# Patient Record
Sex: Male | Born: 1950 | Race: White | Hispanic: No | Marital: Married | Smoking: Former smoker
Health system: Southern US, Community
[De-identification: ages and names within clinical notes are randomized; demographics above are authoritative.]

## PROBLEM LIST (undated history)

## (undated) DIAGNOSIS — N1330 Unspecified hydronephrosis: Secondary | ICD-10-CM

## (undated) DIAGNOSIS — N433 Hydrocele, unspecified: Secondary | ICD-10-CM

## (undated) DIAGNOSIS — K5909 Other constipation: Secondary | ICD-10-CM

## (undated) DIAGNOSIS — C20 Malignant neoplasm of rectum: Principal | ICD-10-CM

## (undated) DIAGNOSIS — D649 Anemia, unspecified: Secondary | ICD-10-CM

## (undated) DIAGNOSIS — J302 Other seasonal allergic rhinitis: Secondary | ICD-10-CM

## (undated) DIAGNOSIS — R600 Localized edema: Secondary | ICD-10-CM

## (undated) DIAGNOSIS — Z973 Presence of spectacles and contact lenses: Secondary | ICD-10-CM

## (undated) HISTORY — DX: Malignant neoplasm of rectum: C20

## (undated) HISTORY — DX: Anemia, unspecified: D64.9

## (undated) HISTORY — PX: COLONOSCOPY WITH ESOPHAGOGASTRODUODENOSCOPY (EGD): SHX5779

---

## 1999-09-08 ENCOUNTER — Encounter: Admission: RE | Admit: 1999-09-08 | Discharge: 1999-09-08 | Payer: Self-pay

## 2001-08-06 ENCOUNTER — Ambulatory Visit (HOSPITAL_COMMUNITY): Admission: RE | Admit: 2001-08-06 | Discharge: 2001-08-06 | Payer: Self-pay | Admitting: Gastroenterology

## 2001-09-03 ENCOUNTER — Ambulatory Visit (HOSPITAL_COMMUNITY): Admission: RE | Admit: 2001-09-03 | Discharge: 2001-09-03 | Payer: Self-pay | Admitting: Gastroenterology

## 2002-06-02 ENCOUNTER — Encounter: Admission: RE | Admit: 2002-06-02 | Discharge: 2002-06-02 | Payer: Self-pay

## 2002-06-19 ENCOUNTER — Encounter: Admission: RE | Admit: 2002-06-19 | Discharge: 2002-06-19 | Payer: Self-pay

## 2012-03-26 LAB — HM COLONOSCOPY

## 2012-05-23 ENCOUNTER — Ambulatory Visit (INDEPENDENT_AMBULATORY_CARE_PROVIDER_SITE_OTHER): Payer: Federal, State, Local not specified - PPO | Admitting: Internal Medicine

## 2012-05-23 ENCOUNTER — Encounter: Payer: Self-pay | Admitting: Internal Medicine

## 2012-05-23 VITALS — BP 112/60 | HR 73 | Temp 98.0°F | Resp 16 | Ht 75.0 in | Wt 196.0 lb

## 2012-05-23 DIAGNOSIS — Z0000 Encounter for general adult medical examination without abnormal findings: Secondary | ICD-10-CM | POA: Insufficient documentation

## 2012-05-23 NOTE — Patient Instructions (Signed)
Health Maintenance, Males A healthy lifestyle and preventative care can promote health and wellness.  Maintain regular health, dental, and eye exams.  Eat a healthy diet. Foods like vegetables, fruits, whole grains, low-fat dairy products, and lean protein foods contain the nutrients you need without too many calories. Decrease your intake of foods high in solid fats, added sugars, and salt. Get information about a proper diet from your caregiver, if necessary.  Regular physical exercise is one of the most important things you can do for your health. Most adults should get at least 150 minutes of moderate-intensity exercise (any activity that increases your heart rate and causes you to sweat) each week. In addition, most adults need muscle-strengthening exercises on 2 or more days a week.   Maintain a healthy weight. The body mass index (BMI) is a screening tool to identify possible weight problems. It provides an estimate of body fat based on height and weight. Your caregiver can help determine your BMI, and can help you achieve or maintain a healthy weight. For adults 20 years and older:  A BMI below 18.5 is considered underweight.  A BMI of 18.5 to 24.9 is normal.  A BMI of 25 to 29.9 is considered overweight.  A BMI of 30 and above is considered obese.  Maintain normal blood lipids and cholesterol by exercising and minimizing your intake of saturated fat. Eat a balanced diet with plenty of fruits and vegetables. Blood tests for lipids and cholesterol should begin at age 20 and be repeated every 5 years. If your lipid or cholesterol levels are high, you are over 50, or you are a high risk for heart disease, you may need your cholesterol levels checked more frequently.Ongoing high lipid and cholesterol levels should be treated with medicines, if diet and exercise are not effective.  If you smoke, find out from your caregiver how to quit. If you do not use tobacco, do not start.  If you  choose to drink alcohol, do not exceed 2 drinks per day. One drink is considered to be 12 ounces (355 mL) of beer, 5 ounces (148 mL) of wine, or 1.5 ounces (44 mL) of liquor.  Avoid use of street drugs. Do not share needles with anyone. Ask for help if you need support or instructions about stopping the use of drugs.  High blood pressure causes heart disease and increases the risk of stroke. Blood pressure should be checked at least every 1 to 2 years. Ongoing high blood pressure should be treated with medicines if weight loss and exercise are not effective.  If you are 45 to 62 years old, ask your caregiver if you should take aspirin to prevent heart disease.  Diabetes screening involves taking a blood sample to check your fasting blood sugar level. This should be done once every 3 years, after age 45, if you are within normal weight and without risk factors for diabetes. Testing should be considered at a younger age or be carried out more frequently if you are overweight and have at least 1 risk factor for diabetes.  Colorectal cancer can be detected and often prevented. Most routine colorectal cancer screening begins at the age of 50 and continues through age 75. However, your caregiver may recommend screening at an earlier age if you have risk factors for colon cancer. On a yearly basis, your caregiver may provide home test kits to check for hidden blood in the stool. Use of a small camera at the end of a tube,   to directly examine the colon (sigmoidoscopy or colonoscopy), can detect the earliest forms of colorectal cancer. Talk to your caregiver about this at age 50, when routine screening begins. Direct examination of the colon should be repeated every 5 to 10 years through age 75, unless early forms of pre-cancerous polyps or small growths are found.  Hepatitis C blood testing is recommended for all people born from 1945 through 1965 and any individual with known risks for hepatitis C.  Healthy  men should no longer receive prostate-specific antigen (PSA) blood tests as part of routine cancer screening. Consult with your caregiver about prostate cancer screening.  Testicular cancer screening is not recommended for adolescents or adult males who have no symptoms. Screening includes self-exam, caregiver exam, and other screening tests. Consult with your caregiver about any symptoms you have or any concerns you have about testicular cancer.  Practice safe sex. Use condoms and avoid high-risk sexual practices to reduce the spread of sexually transmitted infections (STIs).  Use sunscreen with a sun protection factor (SPF) of 30 or greater. Apply sunscreen liberally and repeatedly throughout the day. You should seek shade when your shadow is shorter than you. Protect yourself by wearing long sleeves, pants, a wide-brimmed hat, and sunglasses year round, whenever you are outdoors.  Notify your caregiver of new moles or changes in moles, especially if there is a change in shape or color. Also notify your caregiver if a mole is larger than the size of a pencil eraser.  A one-time screening for abdominal aortic aneurysm (AAA) and surgical repair of large AAAs by sound wave imaging (ultrasonography) is recommended for ages 65 to 75 years who are current or former smokers.  Stay current with your immunizations. Document Released: 11/25/2007 Document Revised: 08/21/2011 Document Reviewed: 10/24/2010 [employer] Patient Information 2013 [employer].  

## 2012-05-23 NOTE — Progress Notes (Signed)
  Subjective:    Patient ID: Nicholas Suarez, male    DOB: 12-08-1950, 61 y.o.   MRN: [id number]  HPI  New to me he needs a new PCP - he had a complete physical at [facility] about 2 months ago, no records are available today. He feels well and offers no complaints.  Review of Systems  Constitutional: Negative.   HENT: Negative.   Eyes: Negative.   Respiratory: Negative.   Cardiovascular: Negative.   Gastrointestinal: Negative.   Genitourinary: Negative.   Musculoskeletal: Negative.   Skin: Negative.   Neurological: Negative.   Hematological: Negative.   Psychiatric/Behavioral: Negative.        Objective:   Physical Exam  Vitals reviewed. Constitutional: He is oriented to person, place, and time. He appears well-developed and well-nourished. No distress.  HENT:  Head: Normocephalic and atraumatic.  Mouth/Throat: Oropharynx is clear and moist. No oropharyngeal exudate.  Eyes: Conjunctivae normal are normal. Right eye exhibits no discharge. Left eye exhibits no discharge. No scleral icterus.  Neck: Normal range of motion. Neck supple. No JVD present. No tracheal deviation present. No thyromegaly present.  Cardiovascular: Normal rate, regular rhythm, normal heart sounds and intact distal pulses.  Exam reveals no gallop and no friction rub.   No murmur heard. Pulmonary/Chest: Effort normal and breath sounds normal. No stridor. No respiratory distress. He has no wheezes. He has no rales. He exhibits no tenderness.  Abdominal: Soft. Bowel sounds are normal. He exhibits no distension and no mass. There is no tenderness. There is no rebound and no guarding.  Musculoskeletal: Normal range of motion. He exhibits no edema and no tenderness.  Lymphadenopathy:    He has no cervical adenopathy.  Neurological: He is oriented to person, place, and time.  Skin: Skin is warm and dry. No rash noted. He is not diaphoretic. No erythema. No pallor.  Psychiatric: He has a normal mood and affect. His  behavior is normal. Judgment and thought content normal.     No results found for this basename: WBC, HGB, HCT, PLT, GLUCOSE, CHOL, TRIG, HDL, LDLDIRECT, LDLCALC, ALT, AST, NA, K, CL, CREATININE, BUN, CO2, TSH, PSA, INR, GLUF, HGBA1C, MICROALBUR       Assessment & Plan:

## 2012-05-23 NOTE — Assessment & Plan Note (Signed)
I requested the records from [facility] today

## 2013-05-30 ENCOUNTER — Encounter: Payer: Self-pay | Admitting: Internal Medicine

## 2013-05-30 ENCOUNTER — Ambulatory Visit (INDEPENDENT_AMBULATORY_CARE_PROVIDER_SITE_OTHER): Payer: Federal, State, Local not specified - PPO | Admitting: Internal Medicine

## 2013-05-30 VITALS — BP 122/80 | HR 80 | Temp 97.1°F | Resp 16 | Wt 208.0 lb

## 2013-05-30 DIAGNOSIS — J069 Acute upper respiratory infection, unspecified: Secondary | ICD-10-CM

## 2013-05-30 MED ORDER — AZITHROMYCIN 250 MG PO TABS: ORAL_TABLET | ORAL | Status: DC

## 2013-05-30 NOTE — Assessment & Plan Note (Signed)
Zpac if worse 

## 2013-05-30 NOTE — Progress Notes (Signed)
Pre visit review using our clinic review tool, if applicable. No additional management support is needed unless otherwise documented below in the visit note. 

## 2013-05-30 NOTE — Progress Notes (Signed)
   Subjective:    Patient ID: Nicholas Suarez, male    DOB: 12/31/1950, 62 y.o.   MRN: [id number]  Sore Throat  This is a new problem. The current episode started in the past 7 days. The problem has been gradually worsening. Neither side of throat is experiencing more pain than the other. The maximum temperature recorded prior to his arrival was 100 - 100.9 F. The pain is moderate. Associated symptoms include coughing. Pertinent negatives include no congestion, diarrhea, neck pain or trouble swallowing. He has had no exposure to strep.   Sick at home since 12.17   Review of Systems  Constitutional: Negative for appetite change, fatigue and unexpected weight change.  HENT: Positive for sore throat. Negative for congestion, nosebleeds, sneezing and trouble swallowing.   Eyes: Negative for itching and visual disturbance.  Respiratory: Positive for cough.   Cardiovascular: Negative for chest pain, palpitations and leg swelling.  Gastrointestinal: Negative for nausea, diarrhea, blood in stool and abdominal distention.  Genitourinary: Negative for frequency and hematuria.  Musculoskeletal: Negative for back pain, gait problem, joint swelling and neck pain.  Skin: Negative for rash.  Neurological: Negative for dizziness, tremors, speech difficulty and weakness.  Psychiatric/Behavioral: Negative for sleep disturbance, dysphoric mood and agitation. The patient is not nervous/anxious.        Objective:   Physical Exam  Constitutional: He is oriented to person, place, and time. He appears well-developed. No distress.  NAD  HENT:  Mouth/Throat: Oropharynx is clear and moist.  eryth throat  Eyes: Conjunctivae are normal. Pupils are equal, round, and reactive to light.  Neck: Normal range of motion. No JVD present. No thyromegaly present.  Cardiovascular: Normal rate, regular rhythm, normal heart sounds and intact distal pulses.  Exam reveals no gallop and no friction rub.   No murmur  heard. Pulmonary/Chest: Effort normal and breath sounds normal. No respiratory distress. He has no wheezes. He has no rales. He exhibits no tenderness.  Abdominal: Soft. Bowel sounds are normal. He exhibits no distension and no mass. There is no tenderness. There is no rebound and no guarding.  Musculoskeletal: Normal range of motion. He exhibits no edema and no tenderness.  Lymphadenopathy:    He has no cervical adenopathy.  Neurological: He is alert and oriented to person, place, and time. He has normal reflexes. No cranial nerve deficit. He exhibits normal muscle tone. He displays a negative Romberg sign. Coordination and gait normal.  No meningeal signs  Skin: Skin is warm and dry. No rash noted.  Psychiatric: He has a normal mood and affect. His behavior is normal. Judgment and thought content normal.          Assessment & Plan:

## 2013-05-30 NOTE — Patient Instructions (Signed)

## 2014-04-09 ENCOUNTER — Encounter: Payer: Self-pay | Admitting: Internal Medicine

## 2014-04-09 ENCOUNTER — Other Ambulatory Visit (INDEPENDENT_AMBULATORY_CARE_PROVIDER_SITE_OTHER): Payer: Federal, State, Local not specified - PPO

## 2014-04-09 ENCOUNTER — Ambulatory Visit (INDEPENDENT_AMBULATORY_CARE_PROVIDER_SITE_OTHER): Payer: Federal, State, Local not specified - PPO | Admitting: Internal Medicine

## 2014-04-09 VITALS — BP 130/70 | HR 76 | Temp 97.5°F | Resp 16 | Ht 73.0 in | Wt 200.0 lb

## 2014-04-09 DIAGNOSIS — Z0000 Encounter for general adult medical examination without abnormal findings: Secondary | ICD-10-CM

## 2014-04-09 LAB — URINALYSIS, ROUTINE W REFLEX MICROSCOPIC
BILIRUBIN URINE: NEGATIVE
Hgb urine dipstick: NEGATIVE
Ketones, ur: NEGATIVE
Leukocytes, UA: NEGATIVE
NITRITE: NEGATIVE
Specific Gravity, Urine: 1.02 (ref 1.000–1.030)
TOTAL PROTEIN, URINE-UPE24: NEGATIVE
Urine Glucose: NEGATIVE
Urobilinogen, UA: 0.2 (ref 0.0–1.0)
pH: 6 (ref 5.0–8.0)

## 2014-04-09 LAB — CBC WITH DIFFERENTIAL/PLATELET
BASOS PCT: 0.6 % (ref 0.0–3.0)
Basophils Absolute: 0 10*3/uL (ref 0.0–0.1)
EOS ABS: 0.3 10*3/uL (ref 0.0–0.7)
Eosinophils Relative: 4.8 % (ref 0.0–5.0)
HCT: 42.8 % (ref 39.0–52.0)
HEMOGLOBIN: 13.9 g/dL (ref 13.0–17.0)
Lymphocytes Relative: 27.3 % (ref 12.0–46.0)
Lymphs Abs: 1.6 10*3/uL (ref 0.7–4.0)
MCHC: 32.6 g/dL (ref 30.0–36.0)
MCV: 94 fl (ref 78.0–100.0)
MONO ABS: 0.4 10*3/uL (ref 0.1–1.0)
Monocytes Relative: 6.8 % (ref 3.0–12.0)
NEUTROS ABS: 3.5 10*3/uL (ref 1.4–7.7)
Neutrophils Relative %: 60.5 % (ref 43.0–77.0)
Platelets: 198 10*3/uL (ref 150.0–400.0)
RBC: 4.55 Mil/uL (ref 4.22–5.81)
RDW: 13.9 % (ref 11.5–15.5)
WBC: 5.8 10*3/uL (ref 4.0–10.5)

## 2014-04-09 LAB — COMPREHENSIVE METABOLIC PANEL
ALK PHOS: 58 U/L (ref 39–117)
ALT: 26 U/L (ref 0–53)
AST: 25 U/L (ref 0–37)
Albumin: 3.4 g/dL — ABNORMAL LOW (ref 3.5–5.2)
BUN: 13 mg/dL (ref 6–23)
CO2: 25 mEq/L (ref 19–32)
CREATININE: 1 mg/dL (ref 0.4–1.5)
Calcium: 10 mg/dL (ref 8.4–10.5)
Chloride: 106 mEq/L (ref 96–112)
GFR: 81.87 mL/min (ref >60.00)
Glucose, Bld: 100 mg/dL — ABNORMAL HIGH (ref 70–99)
Potassium: 4.3 mEq/L (ref 3.5–5.1)
Sodium: 136 mEq/L (ref 135–145)
Total Bilirubin: 0.9 mg/dL (ref 0.2–1.2)
Total Protein: 6.2 g/dL (ref 6.0–8.3)

## 2014-04-09 LAB — TSH: TSH: 1.65 u[IU]/mL (ref 0.35–4.50)

## 2014-04-09 LAB — PSA: PSA: 0.35 ng/mL (ref 0.10–4.00)

## 2014-04-09 LAB — LIPID PANEL
CHOL/HDL RATIO: 4
Cholesterol: 164 mg/dL (ref 0–200)
HDL: 39.3 mg/dL (ref >39.00)
LDL CALC: 110 mg/dL — AB (ref 0–99)
NonHDL: 124.7
Triglycerides: 74 mg/dL (ref 0.0–149.0)
VLDL: 14.8 mg/dL (ref 0.0–40.0)

## 2014-04-09 LAB — FECAL OCCULT BLOOD, GUAIAC: Fecal Occult Blood: NEGATIVE

## 2014-04-09 NOTE — Patient Instructions (Signed)

## 2014-04-09 NOTE — Progress Notes (Signed)
   Subjective:    Patient ID: Nicholas Suarez, male    DOB: 03-21-1951, 63 y.o.   MRN: [id number]  HPI Comments: He returns for a physical, he tells me that he feels well and offers no complaints.     Review of Systems  Constitutional: Negative.  Negative for fever, chills, diaphoresis, appetite change and fatigue.  HENT: Negative.   Eyes: Negative.   Respiratory: Negative.  Negative for cough, choking, chest tightness, shortness of breath and stridor.   Cardiovascular: Negative.  Negative for chest pain, palpitations and leg swelling.  Gastrointestinal: Negative.  Negative for nausea, vomiting, abdominal pain, diarrhea, constipation and blood in stool.  Endocrine: Negative.   Genitourinary: Negative.  Negative for urgency, flank pain, decreased urine volume and difficulty urinating.  Musculoskeletal: Negative.  Negative for myalgias and back pain.  Skin: Negative.  Negative for rash.  Allergic/Immunologic: Negative.   Neurological: Negative.  Negative for dizziness.  Hematological: Negative.  Negative for adenopathy. Does not bruise/bleed easily.  Psychiatric/Behavioral: Negative.        Objective:   Physical Exam  Constitutional: He is oriented to person, place, and time. He appears well-developed and well-nourished. No distress.  HENT:  Head: Normocephalic and atraumatic.  Mouth/Throat: Oropharynx is clear and moist. No oropharyngeal exudate.  Eyes: Conjunctivae are normal. Right eye exhibits no discharge. Left eye exhibits no discharge. No scleral icterus.  Neck: Normal range of motion. Neck supple. No JVD present. No tracheal deviation present. No thyromegaly present.  Cardiovascular: Normal rate, regular rhythm, normal heart sounds and intact distal pulses.  Exam reveals no gallop and no friction rub.   No murmur heard. Pulmonary/Chest: Effort normal and breath sounds normal. No stridor. No respiratory distress. He has no wheezes. He has no rales. He exhibits no tenderness.    Abdominal: Soft. Bowel sounds are normal. He exhibits no distension and no mass. There is no tenderness. There is no rebound and no guarding. Hernia confirmed negative in the right inguinal area and confirmed negative in the left inguinal area.  Genitourinary: Rectum normal, prostate normal, testes normal and penis normal. Rectal exam shows no external hemorrhoid, no internal hemorrhoid, no fissure, no mass, no tenderness and anal tone normal. Guaiac negative stool. Prostate is not enlarged and not tender. Right testis shows no mass, no swelling and no tenderness. Right testis is descended. Left testis shows no mass, no swelling and no tenderness. Left testis is descended. Uncircumcised. No phimosis, paraphimosis, hypospadias, penile erythema or penile tenderness. No discharge found.  Musculoskeletal: Normal range of motion. He exhibits no edema or tenderness.  Lymphadenopathy:    He has no cervical adenopathy.       Right: No inguinal adenopathy present.       Left: No inguinal adenopathy present.  Neurological: He is oriented to person, place, and time.  Skin: Skin is warm and dry. No rash noted. He is not diaphoretic. No erythema. No pallor.  Psychiatric: He has a normal mood and affect. His behavior is normal. Judgment and thought content normal.  Vitals reviewed.     No results found for this basename: WBC, HGB, HCT, PLT, GLUCOSE, CHOL, TRIG, HDL, LDLDIRECT, LDLCALC, ALT, AST, NA, K, CL, CREATININE, BUN, CO2, TSH, PSA, INR, GLUF, HGBA1C, MICROALBUR      Assessment & Plan:

## 2014-04-09 NOTE — Progress Notes (Signed)
Pre visit review using our clinic review tool, if applicable. No additional management support is needed unless otherwise documented below in the visit note. 

## 2014-04-12 NOTE — Assessment & Plan Note (Signed)
Exam done He refused a flu vax Labs ordered Pt ed material was given 

## 2014-04-28 ENCOUNTER — Ambulatory Visit (INDEPENDENT_AMBULATORY_CARE_PROVIDER_SITE_OTHER): Payer: Federal, State, Local not specified - PPO | Admitting: Internal Medicine

## 2014-04-28 ENCOUNTER — Other Ambulatory Visit: Payer: Federal, State, Local not specified - PPO

## 2014-04-28 ENCOUNTER — Ambulatory Visit (INDEPENDENT_AMBULATORY_CARE_PROVIDER_SITE_OTHER)
Admission: RE | Admit: 2014-04-28 | Discharge: 2014-04-28 | Disposition: A | Discharge status: Home / Self Care | Payer: Federal, State, Local not specified - PPO | Source: Ambulatory Visit | Attending: Internal Medicine | Admitting: Internal Medicine

## 2014-04-28 ENCOUNTER — Encounter: Payer: Self-pay | Admitting: Internal Medicine

## 2014-04-28 VITALS — BP 120/80 | HR 89 | Temp 98.0°F | Resp 16 | Ht 73.0 in | Wt 207.0 lb

## 2014-04-28 DIAGNOSIS — J202 Acute bronchitis due to streptococcus: Secondary | ICD-10-CM

## 2014-04-28 DIAGNOSIS — R059 Cough, unspecified: Secondary | ICD-10-CM

## 2014-04-28 DIAGNOSIS — R05 Cough: Secondary | ICD-10-CM

## 2014-04-28 DIAGNOSIS — Z0000 Encounter for general adult medical examination without abnormal findings: Secondary | ICD-10-CM

## 2014-04-28 LAB — HEPATITIS C ANTIBODY: HCV Ab: NEGATIVE

## 2014-04-28 MED ORDER — PROMETHAZINE-DM 6.25-15 MG/5ML PO SYRP: 5.0000 mL | ORAL_SOLUTION | Freq: Four times a day (QID) | ORAL | Status: DC | PRN

## 2014-04-28 MED ORDER — AZITHROMYCIN 500 MG PO TABS: 500.0000 mg | ORAL_TABLET | Freq: Every day | ORAL | Status: DC

## 2014-04-28 NOTE — Patient Instructions (Signed)

## 2014-04-28 NOTE — Progress Notes (Signed)
   Subjective:    Patient ID: Nicholas Suarez, male    DOB: 07/11/1950, 63 y.o.   MRN: [id number]  Cough This is a new problem. The current episode started in the past 7 days. The problem has been gradually worsening. The problem occurs every few hours. The cough is productive of brown sputum. Associated symptoms include chills. Pertinent negatives include no chest pain, ear congestion, ear pain, fever, headaches, heartburn, hemoptysis, myalgias, nasal congestion, postnasal drip, rash, rhinorrhea, sore throat, shortness of breath, sweats, weight loss or wheezing. He has tried OTC cough suppressant for the symptoms. The treatment provided mild relief. There is no history of asthma, bronchiectasis, bronchitis, COPD, emphysema, environmental allergies or pneumonia.      Review of Systems  Constitutional: Positive for chills. Negative for fever and weight loss.  HENT: Negative for ear pain, postnasal drip, rhinorrhea and sore throat.   Respiratory: Positive for cough. Negative for hemoptysis, shortness of breath and wheezing.   Cardiovascular: Negative for chest pain.  Gastrointestinal: Negative for heartburn.  Musculoskeletal: Negative for myalgias.  Skin: Negative for rash.  Allergic/Immunologic: Negative for environmental allergies.  Neurological: Negative for headaches.  All other systems reviewed and are negative.      Objective:   Physical Exam  Constitutional: He is oriented to person, place, and time. He appears well-developed and well-nourished.  Non-toxic appearance. He does not have a sickly appearance. He does not appear ill. No distress.  HENT:  Head: Normocephalic and atraumatic.  Mouth/Throat: Oropharynx is clear and moist. No oropharyngeal exudate.  Eyes: Conjunctivae are normal. Right eye exhibits no discharge. Left eye exhibits no discharge. No scleral icterus.  Neck: Normal range of motion. Neck supple. No JVD present. No tracheal deviation present. No thyromegaly  present.  Cardiovascular: Normal rate, regular rhythm, normal heart sounds and intact distal pulses.  Exam reveals no gallop and no friction rub.   No murmur heard. Pulmonary/Chest: Effort normal. No accessory muscle usage or stridor. No respiratory distress. He has no decreased breath sounds. He has no wheezes. He has rhonchi in the right lower field and the left lower field. He has no rales. He exhibits no tenderness.  Abdominal: Soft. Bowel sounds are normal. He exhibits no distension and no mass. There is no tenderness. There is no rebound and no guarding.  Musculoskeletal: Normal range of motion. He exhibits no edema or tenderness.  Lymphadenopathy:    He has no cervical adenopathy.  Neurological: He is oriented to person, place, and time.  Skin: Skin is warm and dry. No rash noted. He is not diaphoretic. No erythema. No pallor.  Psychiatric: He has a normal mood and affect. His behavior is normal. Judgment and thought content normal.  Vitals reviewed.    Lab Results  Component Value Date   WBC 5.8 04/09/2014   HGB 13.9 04/09/2014   HCT 42.8 04/09/2014   PLT 198.0 04/09/2014   GLUCOSE 100* 04/09/2014   CHOL 164 04/09/2014   TRIG 74.0 04/09/2014   HDL 39.30 04/09/2014   LDLCALC 110* 04/09/2014   ALT 26 04/09/2014   AST 25 04/09/2014   NA 136 04/09/2014   K 4.3 04/09/2014   CL 106 04/09/2014   CREATININE 1.0 04/09/2014   BUN 13 04/09/2014   CO2 25 04/09/2014   TSH 1.65 04/09/2014   PSA 0.35 04/09/2014       Assessment & Plan:

## 2014-04-28 NOTE — Progress Notes (Signed)
Pre visit review using our clinic review tool, if applicable. No additional management support is needed unless otherwise documented below in the visit note. 

## 2014-04-29 ENCOUNTER — Encounter: Payer: Self-pay | Admitting: Internal Medicine

## 2014-04-29 NOTE — Assessment & Plan Note (Signed)
His CXR is normal Will treat the infection with zithromax and will control the cough with phenergan-dm

## 2014-04-29 NOTE — Assessment & Plan Note (Signed)
His CXR is normal 

## 2014-04-29 NOTE — Addendum Note (Signed)
Addended by: Janith Lima on: 04/29/2014 07:40 AM   Modules accepted: Miquel Dunn

## 2014-05-14 ENCOUNTER — Telehealth: Payer: Self-pay | Admitting: Internal Medicine

## 2014-05-14 NOTE — Telephone Encounter (Signed)
Received 3 pages from [place], sent to Dr. Ronnald Ramp. 05/14/14/ss

## 2014-10-13 ENCOUNTER — Telehealth: Payer: Self-pay | Admitting: Internal Medicine

## 2014-10-13 NOTE — Telephone Encounter (Signed)
Received records from [facility]. 3 pages forwarding to Dr. Scarlette Calico

## 2015-02-18 ENCOUNTER — Ambulatory Visit (INDEPENDENT_AMBULATORY_CARE_PROVIDER_SITE_OTHER): Payer: Federal, State, Local not specified - PPO | Admitting: Internal Medicine

## 2015-02-18 ENCOUNTER — Encounter: Payer: Self-pay | Admitting: Internal Medicine

## 2015-02-18 VITALS — BP 110/64 | HR 85 | Temp 98.0°F | Ht 75.0 in | Wt 207.0 lb

## 2015-02-18 DIAGNOSIS — J4520 Mild intermittent asthma, uncomplicated: Secondary | ICD-10-CM | POA: Diagnosis not present

## 2015-02-18 DIAGNOSIS — R059 Cough, unspecified: Secondary | ICD-10-CM

## 2015-02-18 DIAGNOSIS — J309 Allergic rhinitis, unspecified: Secondary | ICD-10-CM | POA: Diagnosis not present

## 2015-02-18 DIAGNOSIS — R05 Cough: Secondary | ICD-10-CM | POA: Diagnosis not present

## 2015-02-18 DIAGNOSIS — J45909 Unspecified asthma, uncomplicated: Secondary | ICD-10-CM | POA: Insufficient documentation

## 2015-02-18 MED ORDER — METHYLPREDNISOLONE ACETATE 40 MG/ML IJ SUSP
40.0000 mg | Freq: Once | INTRAMUSCULAR | Status: AC
  Administered 2015-02-18: 80 mg via INTRAMUSCULAR

## 2015-02-18 MED ORDER — LEVOFLOXACIN 250 MG PO TABS: 250.0000 mg | ORAL_TABLET | Freq: Every day | ORAL | Status: DC

## 2015-02-18 MED ORDER — HYDROCODONE-HOMATROPINE 5-1.5 MG/5ML PO SYRP: 5.0000 mL | ORAL_SOLUTION | Freq: Four times a day (QID) | ORAL | Status: DC | PRN

## 2015-02-18 NOTE — Progress Notes (Signed)
Pre visit review using our clinic review tool, if applicable. No additional management support is needed unless otherwise documented below in the visit note. 

## 2015-02-18 NOTE — Assessment & Plan Note (Signed)
Likely infectious, cant r/o pna, declines cxr, for levaquin asd, cough med prn

## 2015-02-18 NOTE — Assessment & Plan Note (Signed)
stable overall by history and exam, recent data reviewed with pt, and pt to continue medical treatment as before,  to f/u any worsening symptoms or concerns SpO2 Readings from Last 3 Encounters:  02/18/15 96%  04/28/14 95%  04/09/14 97%

## 2015-02-18 NOTE — Assessment & Plan Note (Signed)
Mild to mod, for depomedrol IM,  to f/u any worsening symptoms or concerns 

## 2015-02-18 NOTE — Patient Instructions (Signed)
You had the steroid shot today  Please take all new medication as prescribed - the antibiotic and cough medicine as needed  Please continue all other medications as before, and refills have been done if requested.  Please have the pharmacy call with any other refills you may need.  Please keep your appointments with your specialists as you may have planned

## 2015-02-18 NOTE — Addendum Note (Signed)
Addended by: Lyman Bishop on: 02/18/2015 06:29 PM   Modules accepted: Orders

## 2015-02-18 NOTE — Progress Notes (Signed)
   Subjective:    Patient ID: Nicholas Suarez, male    DOB: [birth year], 64 y.o.   MRN: [id number]  HPI   Here with 2-3 days acute onset fever, facial pain, pressure, headache, general weakness and malaise, and greenish d/c, with mild ST and cough, but pt denies chest pain, wheezing, increased sob or doe, orthopnea, PND, increased LE swelling, palpitations, dizziness or syncope. In last day now with acute ST, HA, general weakness and malaise, with prod cough greenish sputum.  Does have several wks ongoing nasal allergy symptoms with clearish congestion, itch as well. Past Medical History  Diagnosis Date  . Asthma    No past surgical history on file.  reports that he has quit smoking. He has never used smokeless tobacco. He reports that he does not drink alcohol or use illicit drugs. family history includes Alcohol abuse in his father and other; Arthritis in his other; Cancer in his other; Drug abuse in his other; Heart disease in his other. There is no history of Stroke, Kidney disease, Hypertension, Hyperlipidemia, or Early death. No Known Allergies Current Outpatient Prescriptions on File Prior to Visit  Medication Sig Dispense Refill  . azithromycin (ZITHROMAX) 500 MG tablet Take 1 tablet (500 mg total) by mouth daily. (Patient not taking: Reported on 02/18/2015) 3 tablet 0  . promethazine-dextromethorphan (PROMETHAZINE-DM) 6.25-15 MG/5ML syrup Take 5 mLs by mouth 4 (four) times daily as needed for cough. (Patient not taking: Reported on 02/18/2015) 118 mL 0   No current facility-administered medications on file prior to visit.   Review of Systems  Constitutional: Negative for unusual diaphoresis or night sweats HENT: Negative for ringing in ear or discharge Eyes: Negative for double vision or worsening visual disturbance.  Respiratory: Negative for choking and stridor.   Gastrointestinal: Negative for vomiting or other signifcant bowel change Genitourinary: Negative for hematuria or change in  urine volume.  Musculoskeletal: Negative for other MSK pain or swelling Skin: Negative for color change and worsening wound.  Neurological: Negative for tremors and numbness other than noted  Psychiatric/Behavioral: Negative for decreased concentration or agitation other than above       Objective:   Physical Exam BP 110/64 mmHg  Pulse 85  Temp(Src) 98 F (36.7 C) (Oral)  Ht 6\' 3"  (1.905 m)  Wt 207 lb (93.895 kg)  BMI 25.87 kg/m2  SpO2 96% VS noted, mild ill Constitutional: Pt appears in no significant distress HENT: Head: NCAT.  Right Ear: External ear normal.  Left Ear: External ear normal.  Eyes: . Pupils are equal, round, and reactive to light. Conjunctivae and EOM are normal Bilat tm's with mild erythema.  Max sinus areas mild tender.  Pharynx with mild erythema, no exudate Neck: Normal range of motion. Neck supple. with bilat tender submandib LA noted Cardiovascular: Normal rate and regular rhythm.   Pulmonary/Chest: Effort normal and breath sounds without rales or wheezing.  Neurological: Pt is alert. Not confused , motor grossly intact Skin: Skin is warm. No rash, no LE edema Psychiatric: Pt behavior is normal. No agitation.     Assessment & Plan:

## 2015-09-09 ENCOUNTER — Encounter (INDEPENDENT_AMBULATORY_CARE_PROVIDER_SITE_OTHER): Payer: Self-pay

## 2016-04-14 ENCOUNTER — Ambulatory Visit: Payer: Self-pay | Admitting: General Surgery

## 2016-05-12 HISTORY — PX: UMBILICAL HERNIA REPAIR: SHX196

## 2016-10-10 ENCOUNTER — Other Ambulatory Visit: Payer: Self-pay | Admitting: Orthopaedic Surgery

## 2016-10-10 DIAGNOSIS — M545 Low back pain: Secondary | ICD-10-CM

## 2016-10-20 ENCOUNTER — Other Ambulatory Visit: Payer: Self-pay | Admitting: Rehabilitation

## 2016-10-20 ENCOUNTER — Telehealth: Payer: Self-pay

## 2016-10-20 ENCOUNTER — Ambulatory Visit
Admission: RE | Admit: 2016-10-20 | Discharge: 2016-10-20 | Disposition: A | Discharge status: Home / Self Care | Payer: Federal, State, Local not specified - PPO | Source: Ambulatory Visit | Attending: Rehabilitation | Admitting: Rehabilitation

## 2016-10-20 ENCOUNTER — Ambulatory Visit
Admission: RE | Admit: 2016-10-20 | Discharge: 2016-10-20 | Disposition: A | Discharge status: Home / Self Care | Payer: Federal, State, Local not specified - PPO | Source: Ambulatory Visit | Attending: Orthopaedic Surgery | Admitting: Orthopaedic Surgery

## 2016-10-20 DIAGNOSIS — N3289 Other specified disorders of bladder: Secondary | ICD-10-CM

## 2016-10-20 DIAGNOSIS — K5669 Other partial intestinal obstruction: Secondary | ICD-10-CM

## 2016-10-20 DIAGNOSIS — M545 Low back pain: Secondary | ICD-10-CM

## 2016-10-20 DIAGNOSIS — R1900 Intra-abdominal and pelvic swelling, mass and lump, unspecified site: Secondary | ICD-10-CM

## 2016-10-20 MED ORDER — IOPAMIDOL (ISOVUE-300) INJECTION 61%
100.0000 mL | Freq: Once | INTRAVENOUS | Status: AC | PRN
  Administered 2016-10-20: 100 mL via INTRAVENOUS

## 2016-10-20 NOTE — Telephone Encounter (Signed)
Contacted pt and informed of the results. Pt stated understanding. Informed pt that urology would contact him directly.

## 2016-10-20 NOTE — Telephone Encounter (Signed)
[facility] called and wanted ot make sure Jones saw the results from MRI today and states he may want to follow up with  A PET scan.

## 2016-10-20 NOTE — Telephone Encounter (Signed)
Dr. Towanda Malkin office called and wanted Nicholas Suarez to be aware of the accidental finding. "1. Bilateral hydronephrosis and retroperitoneal adenopathy. This raises concern for lymphoproliferative neoplasm or distal obstruction. Recommend CT the abdomen with contrast for further evaluation."  Pt is going for the STAT CT and Dr. Patrice Paradise request to follow up with pt regarding the CT results.

## 2016-10-20 NOTE — Telephone Encounter (Signed)
Urgent urology referral placed for the results of the scan which include extra water surrounding both kidneys from some obstruction of the ureters due to some fibrous tissue. There is also changes to the bladder wall. There are also many lymph nodes in the area. This could be fibrosis (scar tissue) or it could be related to cancer. Urology will be the best first option to get a look at the bladder and ureter to get a biopsy of the area if needed to establish what is going on. Would proceed with this over pet CT at this time due to being able to establish a diagnosis with tissue.

## 2016-11-01 ENCOUNTER — Other Ambulatory Visit (INDEPENDENT_AMBULATORY_CARE_PROVIDER_SITE_OTHER): Payer: Federal, State, Local not specified - PPO

## 2016-11-01 ENCOUNTER — Ambulatory Visit (INDEPENDENT_AMBULATORY_CARE_PROVIDER_SITE_OTHER): Payer: Federal, State, Local not specified - PPO | Admitting: Internal Medicine

## 2016-11-01 ENCOUNTER — Telehealth: Payer: Self-pay | Admitting: Internal Medicine

## 2016-11-01 ENCOUNTER — Encounter: Payer: Self-pay | Admitting: Internal Medicine

## 2016-11-01 VITALS — BP 122/78 | HR 72 | Temp 98.3°F | Resp 16 | Ht 75.0 in | Wt 213.0 lb

## 2016-11-01 DIAGNOSIS — R590 Localized enlarged lymph nodes: Secondary | ICD-10-CM | POA: Diagnosis not present

## 2016-11-01 DIAGNOSIS — Z0000 Encounter for general adult medical examination without abnormal findings: Secondary | ICD-10-CM | POA: Diagnosis not present

## 2016-11-01 DIAGNOSIS — E785 Hyperlipidemia, unspecified: Secondary | ICD-10-CM

## 2016-11-01 DIAGNOSIS — N1330 Unspecified hydronephrosis: Secondary | ICD-10-CM

## 2016-11-01 LAB — LIPID PANEL
CHOLESTEROL: 184 mg/dL (ref 0–200)
HDL: 37.4 mg/dL — ABNORMAL LOW (ref >39.00)
LDL Cholesterol: 128 mg/dL — ABNORMAL HIGH (ref 0–99)
NonHDL: 146.53
TRIGLYCERIDES: 95 mg/dL (ref 0.0–149.0)
Total CHOL/HDL Ratio: 5
VLDL: 19 mg/dL (ref 0.0–40.0)

## 2016-11-01 LAB — COMPREHENSIVE METABOLIC PANEL
ALBUMIN: 3.9 g/dL (ref 3.5–5.2)
ALT: 13 U/L (ref 0–53)
AST: 16 U/L (ref 0–37)
Alkaline Phosphatase: 68 U/L (ref 39–117)
BUN: 16 mg/dL (ref 6–23)
CHLORIDE: 107 meq/L (ref 96–112)
CO2: 29 mEq/L (ref 19–32)
CREATININE: 1.16 mg/dL (ref 0.40–1.50)
Calcium: 10.4 mg/dL (ref 8.4–10.5)
GFR: 66.85 mL/min (ref >60.00)
GLUCOSE: 105 mg/dL — AB (ref 70–99)
POTASSIUM: 4.8 meq/L (ref 3.5–5.1)
Sodium: 142 mEq/L (ref 135–145)
TOTAL PROTEIN: 6.4 g/dL (ref 6.0–8.3)
Total Bilirubin: 0.4 mg/dL (ref 0.2–1.2)

## 2016-11-01 LAB — CBC WITH DIFFERENTIAL/PLATELET
BASOS PCT: 0.9 % (ref 0.0–3.0)
Basophils Absolute: 0.1 10*3/uL (ref 0.0–0.1)
EOS ABS: 0.4 10*3/uL (ref 0.0–0.7)
EOS PCT: 7.1 % — AB (ref 0.0–5.0)
HCT: 37.9 % — ABNORMAL LOW (ref 39.0–52.0)
Hemoglobin: 12.7 g/dL — ABNORMAL LOW (ref 13.0–17.0)
LYMPHS ABS: 1.1 10*3/uL (ref 0.7–4.0)
Lymphocytes Relative: 18.2 % (ref 12.0–46.0)
MCHC: 33.6 g/dL (ref 30.0–36.0)
MCV: 88.1 fl (ref 78.0–100.0)
Monocytes Absolute: 0.4 10*3/uL (ref 0.1–1.0)
Monocytes Relative: 7.6 % (ref 3.0–12.0)
NEUTROS PCT: 66.2 % (ref 43.0–77.0)
Neutro Abs: 3.9 10*3/uL (ref 1.4–7.7)
Platelets: 180 10*3/uL (ref 150.0–400.0)
RBC: 4.3 Mil/uL (ref 4.22–5.81)
RDW: 12.9 % (ref 11.5–15.5)
WBC: 5.9 10*3/uL (ref 4.0–10.5)

## 2016-11-01 LAB — PSA: PSA: 0.41 ng/mL (ref 0.10–4.00)

## 2016-11-01 LAB — URINALYSIS, ROUTINE W REFLEX MICROSCOPIC
BILIRUBIN URINE: NEGATIVE
KETONES UR: NEGATIVE
LEUKOCYTES UA: NEGATIVE
Nitrite: NEGATIVE
Specific Gravity, Urine: <=1.005 — AB (ref 1.000–1.030)
TOTAL PROTEIN, URINE-UPE24: NEGATIVE
UROBILINOGEN UA: 0.2 (ref 0.0–1.0)
Urine Glucose: NEGATIVE
WBC, UA: NONE SEEN (ref 0–?)
pH: 6 (ref 5.0–8.0)

## 2016-11-01 LAB — HCG, QUANTITATIVE, PREGNANCY: QUANTITATIVE HCG: 0.44 m[IU]/mL

## 2016-11-01 LAB — HIV ANTIBODY (ROUTINE TESTING W REFLEX): HIV: NONREACTIVE

## 2016-11-01 NOTE — Progress Notes (Signed)
Subjective:  Patient ID: Nicholas Suarez, male    DOB: May 28, 1951  Age: 66 y.o. MRN: [id number]  CC: Annual Exam   HPI Nicholas Suarez presents for a CPX.  Over the last few months he has developed some discomfort in his lower back as well as swelling in his left scrotum and right lower extremity so he went to see a back doctor who initially did an MRI and was found to have lymphadenopathy and masses in the retroperitoneal with mild bilateral hydronephrosis. He offers no other complaints today. He also tells me he has been seeing a GI doctor (Medoff).  I don't have any results from Dr.Medoff but he tells me that his labs and workup thus far have been normal according to the GI doctor.  Outpatient Medications Prior to Visit  Medication Sig Dispense Refill  . HYDROcodone-homatropine (HYCODAN) 5-1.5 MG/5ML syrup Take 5 mLs by mouth every 6 (six) hours as needed for cough. 180 mL 0  . levofloxacin (LEVAQUIN) 250 MG tablet Take 1 tablet (250 mg total) by mouth daily. 10 tablet 0  . promethazine-dextromethorphan (PROMETHAZINE-DM) 6.25-15 MG/5ML syrup Take 5 mLs by mouth 4 (four) times daily as needed for cough. (Patient not taking: Reported on 02/18/2015) 118 mL 0   No facility-administered medications prior to visit.     ROS Review of Systems  Constitutional: Negative.  Negative for appetite change, diaphoresis, fatigue and unexpected weight change.  HENT: Negative.  Negative for trouble swallowing.   Eyes: Negative.   Respiratory: Negative.  Negative for cough, chest tightness, shortness of breath and wheezing.   Cardiovascular: Positive for leg swelling. Negative for chest pain and palpitations.  Gastrointestinal: Negative for abdominal pain, blood in stool, constipation, diarrhea, nausea and vomiting.  Endocrine: Negative.   Genitourinary: Positive for scrotal swelling. Negative for decreased urine volume, difficulty urinating, discharge, dysuria, flank pain, frequency, hematuria,  testicular pain and urgency.  Musculoskeletal: Positive for back pain. Negative for myalgias and neck pain.  Skin: Negative for color change and rash.  Allergic/Immunologic: Negative.   Neurological: Negative.  Negative for dizziness, weakness and headaches.  Hematological: Negative for adenopathy. Does not bruise/bleed easily.  Psychiatric/Behavioral: Negative.     Objective:  BP 122/78 (BP Location: Left Arm, Patient Position: Sitting, Cuff Size: Normal)   Pulse 72   Temp 98.3 F (36.8 C) (Oral)   Resp 16   Ht 6\' 3"  (1.905 m)   Wt 213 lb (96.6 kg)   SpO2 97%   BMI 26.62 kg/m   BP Readings from Last 3 Encounters:  11/01/16 122/78  02/18/15 110/64  04/28/14 120/80    Wt Readings from Last 3 Encounters:  11/01/16 213 lb (96.6 kg)  02/18/15 207 lb (93.9 kg)  04/28/14 207 lb (93.9 kg)    Physical Exam  Constitutional: He is oriented to person, place, and time. No distress.  HENT:  Mouth/Throat: Oropharynx is clear and moist. No oropharyngeal exudate.  Eyes: Conjunctivae are normal. Right eye exhibits no discharge. Left eye exhibits no discharge. No scleral icterus.  Neck: Normal range of motion. Neck supple. No JVD present. No tracheal deviation present. No thyromegaly present.  Cardiovascular: Normal rate, regular rhythm, normal heart sounds and intact distal pulses.  Exam reveals no gallop and no friction rub.   No murmur heard. Pulmonary/Chest: Effort normal and breath sounds normal. No stridor. No respiratory distress. He has no wheezes. He has no rales. He exhibits no tenderness.  Abdominal: Soft. Bowel sounds are normal. He exhibits  no distension and no mass. There is no tenderness. There is no rebound and no guarding. Hernia confirmed negative in the right inguinal area and confirmed negative in the left inguinal area.  Genitourinary: Rectum normal, prostate normal and penis normal. Rectal exam shows no external hemorrhoid, no internal hemorrhoid, no fissure, no mass,  no tenderness, anal tone normal and guaiac negative stool. Prostate is not enlarged and not tender. Right testis shows no mass, no swelling and no tenderness. Right testis is descended. Left testis shows swelling. Left testis shows no mass and no tenderness. Left testis is descended. Uncircumcised. No phimosis, paraphimosis, hypospadias, penile erythema or penile tenderness. No discharge found.  Genitourinary Comments: The left hemiscrotum is mildly swollen but there is no tenderness or mass and the testes feels normal  Musculoskeletal: Normal range of motion. He exhibits edema. He exhibits no tenderness or deformity.  There is mild, nonpitting edema around the right ankle  Lymphadenopathy:    He has no cervical adenopathy.       Right: No inguinal adenopathy present.       Left: No inguinal adenopathy present.  Neurological: He is alert and oriented to person, place, and time.  Skin: Skin is warm and dry. No rash noted. He is not diaphoretic. No erythema. No pallor.  Psychiatric: He has a normal mood and affect. His behavior is normal. Judgment and thought content normal.  Vitals reviewed.   Lab Results  Component Value Date   WBC 5.9 11/01/2016   HGB 12.7 (L) 11/01/2016   HCT 37.9 (L) 11/01/2016   PLT 180.0 11/01/2016   GLUCOSE 105 (H) 11/01/2016   CHOL 184 11/01/2016   TRIG 95.0 11/01/2016   HDL 37.40 (L) 11/01/2016   LDLCALC 128 (H) 11/01/2016   ALT 13 11/01/2016   AST 16 11/01/2016   NA 142 11/01/2016   K 4.8 11/01/2016   CL 107 11/01/2016   CREATININE 1.16 11/01/2016   BUN 16 11/01/2016   CO2 29 11/01/2016   TSH 1.65 04/09/2014   PSA 0.41 11/01/2016    Mr Lumbar Spine Wo Contrast  Addendum Date: 10/20/2016   ADDENDUM REPORT: 10/20/2016 08:26 ADDENDUM: These results will be called to the ordering clinician or representative by the Radiologist Assistant, and communication documented in the PACS or zVision Dashboard. Electronically Signed   By: San Morelle M.D.    On: 10/20/2016 08:26   Result Date: 10/20/2016 CLINICAL DATA:  Low back pain. EXAM: MRI LUMBAR SPINE WITHOUT CONTRAST TECHNIQUE: Multiplanar, multisequence MR imaging of the lumbar spine was performed. No intravenous contrast was administered. COMPARISON:  None. FINDINGS: Segmentation: 5 non rib-bearing lumbar type vertebral bodies are present. Alignment:  AP alignment is anatomic. Vertebrae:  Marrow signal and vertebral body heights are normal. Conus medullaris: Extends to the L1 level and appears normal. Paraspinal and other soft tissues: Bilateral hydronephrosis is present. There may be a filling defect in the proximal right ureter. Multiple enlarged para-aortic lymph nodes are present. Disc levels: L1-2: Negative. L2-3:  Negative. L3-4:  Negative. L4-5: A broad-based disc protrusion is present. Moderate facet hypertrophy is noted bilaterally. Mild subarticular and foraminal narrowing is evident, right greater than left. L5-S1: A broad-based disc protrusion is asymmetric to the right. Mild facet hypertrophy and spurring is present bilaterally. Mild subarticular narrowing is worse on the right. Moderate right and mild left foraminal stenosis is present. IMPRESSION: 1. Bilateral hydronephrosis and retroperitoneal adenopathy. This raises concern for lymphoproliferative neoplasm or distal obstruction. Recommend CT the abdomen with contrast  for further evaluation. 2. Disc and facet disease at L4-5 with mild subarticular and foraminal narrowing, right greater than left. 3. Disc and facet disease at L5-S1 with mild subarticular narrowing, worse on the right. Moderate right and mild left foraminal narrowing is present. Electronically Signed: By: San Morelle M.D. On: 10/20/2016 08:21   Ct Abdomen Pelvis W Contrast  Result Date: 10/20/2016 CLINICAL DATA:  Followup abnormal MRI scan. Bilateral hydronephrosis and retroperitoneal lymphadenopathy identified. EXAM: CT ABDOMEN AND PELVIS WITH CONTRAST TECHNIQUE:  Multidetector CT imaging of the abdomen and pelvis was performed using the standard protocol following bolus administration of intravenous contrast. CONTRAST:  155mL ISOVUE-300 IOPAMIDOL (ISOVUE-300) INJECTION 61% COMPARISON:  MRI 10/20/2016 FINDINGS: Lower chest: The lung bases are clear of acute process. No pleural effusion or pulmonary lesions. The heart is normal in size. No pericardial effusion. The distal esophagus and aorta are unremarkable. Hepatobiliary: No focal hepatic lesions or intrahepatic biliary dilatation. The gallbladder is normal. No common bile duct dilatation. Pancreas: No mass, inflammation or ductal dilatation Spleen: Normal size.  No focal lesions. Adrenals/Urinary Tract: The adrenal glands are normal. There is bilateral hydronephrosis. No renal or obstructing ureteral calculi. There is a small left renal cyst. No worrisome renal lesions. Both ureters appear thick walled with enhancement and care ureteral interstitial changes. The ureters are surrounded by some type of inflammatory or infiltrative process likely causing the hydronephrosis. No bladder calculi. There is diffuse bladder wall thickening slightly asymmetric at the dome but I do not see a discrete bladder mass. Infiltrating tumor is a possibility. Stomach/Bowel: The stomach, duodenum, small bowel and colon are grossly normal without oral contrast. No inflammatory changes, mass lesions or obstructive findings. The terminal ileum is normal. The appendix is normal. Vascular/Lymphatic: Diffuse ill-defined interstitial process involving the retroperitoneum as demonstrated on the prior lumbar spine MRI. There also numerous borderline enlarged retroperitoneal lymph nodes. The largest measures 12 mm on image number 39. This process surrounds the aorta and IVC and continues down into the pelvis following the iliac vessels. There is also interstitial changes in the presacral space and around the bladder. Interstitial changes and ill-defined  soft tissue thickening noted in the extraperitoneal pelvis around the umbilicus with associated skin thickening. This could be part of the same process. Moderate to advanced atherosclerotic calcifications involving the aorta and iliac arteries but no aneurysm or dissection. Reproductive: The prostate gland and seminal vesicles appear normal. Other: No inguinal mass. Small scattered inguinal lymph nodes. Small bilateral hydroceles are noted. No abdominal wall hernia or subcutaneous lesions. Musculoskeletal: No significant bony findings. Advanced degenerative disc disease noted at L5-S1. IMPRESSION: 1. Fairly extensive retroperitoneal process with marked interstitial thickening and lymphadenopathy as discussed above. Findings could be due to active retroperitoneal fibrosis but I would be more worried about a neoplastic process such as an atypical lymphoma, prostate cancer or bladder cancer. 2. Bilateral hydronephrosis likely due to involvement of both ureters by this retroperitoneal process. There are no obstructing ureteral calculi or obstructing bladder mass. 3. Similar ill-defined soft tissue density and skin thickening around the umbilicus. Recommend clinical correlation. 4. Diffuse bladder wall thickening slightly asymmetric at the bladder dome but no obvious bladder mass. 5. Recommend urology consultation for possible stenting given the bilateral hydronephrosis. 6. PET-CT or retroperitoneal biopsy may be helpful for further evaluation of this process. Electronically Signed   By: Marijo Sanes M.D.   On: 10/20/2016 11:27    Assessment & Plan:   Markell was seen today for annual exam.  Diagnoses and all orders for this visit:  Retroperitoneal lymphadenopathy- I think the best approach to this is to have a biopsy performed to identify the cause for this. I recommended that he undergo fluoroscopy guided biopsy. His labs today show mild anemia but he is HIV negative, beta-hCG is normal so I don't think this is  a seminoma, his PSA is normal so this doesn't look like prostate cancer, he has been seeing a GI doctor so I assume his CEA has been done but to know this for sure I've asked for records from his GI doctor. He also tells me that he will be seeing urology soon regarding the hydronephrosis. -     DG FLUORO GUIDED NEEDLE PLC ASPIRATION/INJECTION LOC; Future -     Comprehensive metabolic panel; Future -     CBC with Differential/Platelet; Future -     HIV antibody; Future -     B-HCG Quant; Future -     PSA; Future  Routine general medical examination at a health care facility- exam completed, labs ordered and reviewed, vaccines reviewed, screening for colon cancer is up-to-date, patient education material was given.  Hyperlipidemia LDL goal <130- his Framingham risk score is elevated at 16%, will consider treating his cardiovascular risk after the workup for the retroperitoneal lymphadenopathy has completed. -     Lipid panel; Future  Hydronephrosis, unspecified hydronephrosis type- his UA is negative for signs of infection, protein, or blood. -     Urinalysis, Routine w reflex microscopic; Future   I have discontinued Mr. Limones's promethazine-dextromethorphan, levofloxacin, and HYDROcodone-homatropine.  No orders of the defined types were placed in this encounter.    Follow-up: Return in about 4 weeks (around 11/29/2016).  Scarlette Calico, MD

## 2016-11-01 NOTE — Patient Instructions (Signed)
 Health Maintenance, Male A healthy lifestyle and preventive care is important for your health and wellness. Ask your health care provider about what schedule of regular examinations is right for you. What should I know about weight and diet?  Eat a Healthy Diet  Eat plenty of vegetables, fruits, whole grains, low-fat dairy products, and lean protein.  Do not eat a lot of foods high in solid fats, added sugars, or salt. Maintain a Healthy Weight  Regular exercise can help you achieve or maintain a healthy weight. You should:  Do at least 150 minutes of exercise each week. The exercise should increase your heart rate and make you sweat (moderate-intensity exercise).  Do strength-training exercises at least twice a week. Watch Your Levels of Cholesterol and Blood Lipids  Have your blood tested for lipids and cholesterol every 5 years starting at 67 years of age. If you are at high risk for heart disease, you should start having your blood tested when you are 67 years old. You may need to have your cholesterol levels checked more often if:  Your lipid or cholesterol levels are high.  You are older than 67 years of age.  You are at high risk for heart disease. What should I know about cancer screening? Many types of cancers can be detected early and may often be prevented. Lung Cancer  You should be screened every year for lung cancer if:  You are a current smoker who has smoked for at least 30 years.  You are a former smoker who has quit within the past 15 years.  Talk to your health care provider about your screening options, when you should start screening, and how often you should be screened. Colorectal Cancer  Routine colorectal cancer screening usually begins at 67 years of age and should be repeated every 5-10 years until you are 67 years old. You may need to be screened more often if early forms of precancerous polyps or small growths are found. Your health care provider  may recommend screening at an earlier age if you have risk factors for colon cancer.  Your health care provider may recommend using home test kits to check for hidden blood in the stool.  A small camera at the end of a tube can be used to examine your colon (sigmoidoscopy or colonoscopy). This checks for the earliest forms of colorectal cancer. Prostate and Testicular Cancer  Depending on your age and overall health, your health care provider may do certain tests to screen for prostate and testicular cancer.  Talk to your health care provider about any symptoms or concerns you have about testicular or prostate cancer. Skin Cancer  Check your skin from head to toe regularly.  Tell your health care provider about any new moles or changes in moles, especially if:  There is a change in a mole's size, shape, or color.  You have a mole that is larger than a pencil eraser.  Always use sunscreen. Apply sunscreen liberally and repeat throughout the day.  Protect yourself by wearing long sleeves, pants, a wide-brimmed hat, and sunglasses when outside. What should I know about heart disease, diabetes, and high blood pressure?  If you are 18-39 years of age, have your blood pressure checked every 3-5 years. If you are 40 years of age or older, have your blood pressure checked every year. You should have your blood pressure measured twice-once when you are at a hospital or clinic, and once when you are not at   a hospital or clinic. Record the average of the two measurements. To check your blood pressure when you are not at a hospital or clinic, you can use:  An automated blood pressure machine at a pharmacy.  A home blood pressure monitor.  Talk to your health care provider about your target blood pressure.  If you are between 45-79 years old, ask your health care provider if you should take aspirin to prevent heart disease.  Have regular diabetes screenings by checking your fasting blood sugar  level.  If you are at a normal weight and have a low risk for diabetes, have this test once every three years after the age of 45.  If you are overweight and have a high risk for diabetes, consider being tested at a younger age or more often.  A one-time screening for abdominal aortic aneurysm (AAA) by ultrasound is recommended for men aged 65-75 years who are current or former smokers. What should I know about preventing infection? Hepatitis B  If you have a higher risk for hepatitis B, you should be screened for this virus. Talk with your health care provider to find out if you are at risk for hepatitis B infection. Hepatitis C  Blood testing is recommended for:  Everyone born from 1945 through 1965.  Anyone with known risk factors for hepatitis C. Sexually Transmitted Diseases (STDs)  You should be screened each year for STDs including gonorrhea and chlamydia if:  You are sexually active and are younger than 67 years of age.  You are older than 67 years of age and your health care provider tells you that you are at risk for this type of infection.  Your sexual activity has changed since you were last screened and you are at an increased risk for chlamydia or gonorrhea. Ask your health care provider if you are at risk.  Talk with your health care provider about whether you are at high risk of being infected with HIV. Your health care provider may recommend a prescription medicine to help prevent HIV infection. What else can I do?  Schedule regular health, dental, and eye exams.  Stay current with your vaccines (immunizations).  Do not use any tobacco products, such as cigarettes, chewing tobacco, and e-cigarettes. If you need help quitting, ask your health care provider.  Limit alcohol intake to no more than 2 drinks per day. One drink equals 12 ounces of beer, 5 ounces of wine, or 1 ounces of hard liquor.  Do not use street drugs.  Do not share needles.  Ask your health  care provider for help if you need support or information about quitting drugs.  Tell your health care provider if you often feel depressed.  Tell your health care provider if you have ever been abused or do not feel safe at home. This information is not intended to replace advice given to you by your health care provider. Make sure you discuss any questions you have with your health care provider. Document Released: 11/25/2007 Document Revised: 01/26/2016 Document Reviewed: 03/02/2015 Elsevier Interactive Patient Education  2017 [employer]  

## 2016-11-01 NOTE — Telephone Encounter (Signed)
Nicholas Suarez [phone] At [facility] and get information for scheduling.

## 2016-11-01 NOTE — Telephone Encounter (Signed)
States order for biopsy just placed is not in correctly.  States diagnostics does not do what Dr. Ronnald Ramp is requesting.  Suggest that radiologist be contacted for consult at [phone] to see if a different order can be placed.

## 2016-11-01 NOTE — Telephone Encounter (Signed)
ok 

## 2016-11-01 NOTE — Telephone Encounter (Signed)
I can call the radiologist if you would like.

## 2016-11-02 ENCOUNTER — Encounter: Payer: Self-pay | Admitting: Internal Medicine

## 2016-11-02 NOTE — Telephone Encounter (Signed)
left msg for Anderson Malta at [phone] to call back for right order info/lb

## 2016-11-06 ENCOUNTER — Encounter: Payer: Self-pay | Admitting: Internal Medicine

## 2016-11-07 NOTE — Telephone Encounter (Signed)
Interventional radiology called. She looked at the new order ([id number]) and she stated that the scheduling team will be able to see the new order.

## 2016-11-07 NOTE — Telephone Encounter (Signed)
I spoke with Nicholas Suarez and she suggested to put in US Biopsy IMG [id number].

## 2016-11-07 NOTE — Telephone Encounter (Signed)
I have that order in.

## 2016-11-07 NOTE — Telephone Encounter (Signed)
Spoke with pt and he is aware order is in review with radiology.

## 2016-11-09 ENCOUNTER — Other Ambulatory Visit: Payer: Self-pay | Admitting: Radiology

## 2016-11-10 ENCOUNTER — Other Ambulatory Visit: Payer: Self-pay | Admitting: Urology

## 2016-11-10 ENCOUNTER — Ambulatory Visit (HOSPITAL_COMMUNITY)
Admission: RE | Admit: 2016-11-10 | Discharge: 2016-11-10 | Disposition: A | Discharge status: Home / Self Care | Payer: Federal, State, Local not specified - PPO | Source: Ambulatory Visit | Attending: Internal Medicine | Admitting: Internal Medicine

## 2016-11-10 ENCOUNTER — Encounter (HOSPITAL_COMMUNITY): Payer: Self-pay

## 2016-11-10 DIAGNOSIS — R590 Localized enlarged lymph nodes: Secondary | ICD-10-CM | POA: Diagnosis present

## 2016-11-10 DIAGNOSIS — Z87891 Personal history of nicotine dependence: Secondary | ICD-10-CM | POA: Insufficient documentation

## 2016-11-10 DIAGNOSIS — C772 Secondary and unspecified malignant neoplasm of intra-abdominal lymph nodes: Secondary | ICD-10-CM | POA: Insufficient documentation

## 2016-11-10 LAB — CBC
HEMATOCRIT: 38.5 % — AB (ref 39.0–52.0)
Hemoglobin: 13 g/dL (ref 13.0–17.0)
MCH: 29.6 pg (ref 26.0–34.0)
MCHC: 33.8 g/dL (ref 30.0–36.0)
MCV: 87.7 fL (ref 78.0–100.0)
PLATELETS: 179 10*3/uL (ref 150–400)
RBC: 4.39 MIL/uL (ref 4.22–5.81)
RDW: 12.9 % (ref 11.5–15.5)
WBC: 6.2 10*3/uL (ref 4.0–10.5)

## 2016-11-10 LAB — PROTIME-INR
INR: 0.98
Prothrombin Time: 12.9 seconds (ref 11.4–15.2)

## 2016-11-10 LAB — APTT: aPTT: 29 seconds (ref 24–36)

## 2016-11-10 MED ORDER — LIDOCAINE HCL (PF) 1 % IJ SOLN
INTRAMUSCULAR | Status: AC | PRN
  Administered 2016-11-10: 10 mL via SUBCUTANEOUS

## 2016-11-10 MED ORDER — NALOXONE HCL 0.4 MG/ML IJ SOLN
INTRAMUSCULAR | Status: AC
  Filled 2016-11-10: qty 1

## 2016-11-10 MED ORDER — MIDAZOLAM HCL 2 MG/2ML IJ SOLN
INTRAMUSCULAR | Status: AC
  Filled 2016-11-10: qty 4

## 2016-11-10 MED ORDER — FLUMAZENIL 0.5 MG/5ML IV SOLN
INTRAVENOUS | Status: AC
  Filled 2016-11-10: qty 5

## 2016-11-10 MED ORDER — MIDAZOLAM HCL 2 MG/2ML IJ SOLN
INTRAMUSCULAR | Status: AC | PRN
  Administered 2016-11-10 (×4): 1 mg via INTRAVENOUS

## 2016-11-10 MED ORDER — FENTANYL CITRATE (PF) 100 MCG/2ML IJ SOLN
INTRAMUSCULAR | Status: AC | PRN
  Administered 2016-11-10 (×2): 50 ug via INTRAVENOUS

## 2016-11-10 MED ORDER — FENTANYL CITRATE (PF) 100 MCG/2ML IJ SOLN
INTRAMUSCULAR | Status: AC
  Filled 2016-11-10: qty 4

## 2016-11-10 MED ORDER — SODIUM CHLORIDE 0.9 % IV SOLN
INTRAVENOUS | Status: DC
  Administered 2016-11-10: 07:00:00 via INTRAVENOUS

## 2016-11-10 NOTE — Procedures (Signed)
Interventional Radiology Procedure Note  Procedure: CT biopsy of left para aortic lymph node. .  Complications: None Recommendations:  - Ok to shower tomorrow - Do not submerge for 7 days - Routine care - advance diet - 1 hour recovery   Signed,  Dulcy Fanny. Earleen Newport, DO

## 2016-11-10 NOTE — H&P (Signed)
Chief Complaint: RTP LAD  Referring Physician:Dr. Scarlette Calico  Supervising Physician: Corrie Mckusick  Patient Status: [facility] - Out-pt  HPI: Nicholas Suarez is a 66 y.o. male who about a month and a half ago developed some loss of sensation for urination.  He thought this may be related to his back so he saw a "back doctor."  He was sent for an MRI which revealed extensive retroperitoneal adenopathy that was worrisome for a neoplastic process.  He was then sent for a CT scan which confirmed this along with other worrisome findings.  A request has been made for a lymph node biopsy to help determine etiology.  He has no abdominal pain, no fevers, CP, SOB, weight loss, etc.  Past Medical History:  Past Medical History:  Diagnosis Date  . Asthma     Past Surgical History:  Past Surgical History:  Procedure Laterality Date  . HERNIA REPAIR      Family History:  Family History  Problem Relation Age of Onset  . Alcohol abuse Father   . Heart disease Other   . Cancer Other        Breast Cancer  . Arthritis Other   . Alcohol abuse Other   . Drug abuse Other   . Stroke Neg Hx   . Kidney disease Neg Hx   . Hypertension Neg Hx   . Hyperlipidemia Neg Hx   . Early death Neg Hx     Social History:  reports that he quit smoking about 28 years ago. His smoking use included Cigarettes, Pipe, and Cigars. He has a 10.00 pack-year smoking history. He quit smokeless tobacco use about 23 years ago. His smokeless tobacco use included Chew. He reports that he does not drink alcohol or use drugs.  Allergies: No Known Allergies  Medications: Medications reviewed in epic.  Please HPI for pertinent positives, otherwise complete 10 system ROS negative.  Mallampati Score: MD Evaluation Airway: WNL Heart: WNL Abdomen: WNL Chest/ Lungs: WNL ASA  Classification: 2 Mallampati/Airway Score: One  Physical Exam: BP (!) 143/82   Pulse 77   Temp 97.9 F (36.6 C) (Oral)   Resp 16   Ht 6\' 3"   (1.905 m)   Wt 208 lb 4 oz (94.5 kg)   SpO2 100%   BMI 26.03 kg/m  Body mass index is 26.03 kg/m. General: pleasant, WD, WN white male who is laying in bed in NAD HEENT: head is normocephalic, atraumatic.  Sclera are noninjected.  PERRL.  Ears and nose without any masses or lesions.  Mouth is pink and moist Heart: regular, rate, and rhythm.  Normal s1,s2. No obvious murmurs, gallops, or rubs noted.  Palpable radial and pedal pulses bilaterally Lungs: CTAB, no wheezes, rhonchi, or rales noted.  Respiratory effort nonlabored Abd: soft, NT, ND, +BS, no masses, hernias, or organomegaly Psych: A&Ox3 with an appropriate affect.   Labs: Results for orders placed or performed during the hospital encounter of 11/10/16 (from the past 48 hour(s))  APTT upon arrival     Status: None   Collection Time: 11/10/16  7:13 AM  Result Value Ref Range   aPTT 29 24 - 36 seconds  CBC upon arrival     Status: Abnormal   Collection Time: 11/10/16  7:13 AM  Result Value Ref Range   WBC 6.2 4.0 - 10.5 K/uL   RBC 4.39 4.22 - 5.81 MIL/uL   Hemoglobin 13.0 13.0 - 17.0 g/dL   HCT 38.5 (L) 39.0 - 52.0 %  MCV 87.7 78.0 - 100.0 fL   MCH 29.6 26.0 - 34.0 pg   MCHC 33.8 30.0 - 36.0 g/dL   RDW 12.9 11.5 - 15.5 %   Platelets 179 150 - 400 K/uL  Protime-INR upon arrival     Status: None   Collection Time: 11/10/16  7:13 AM  Result Value Ref Range   Prothrombin Time 12.9 11.4 - 15.2 seconds   INR 0.98     Imaging: No results found.  Assessment/Plan 1. Retroperitoneal lymphadenopathy We will plan to proceed with a lymph node biopsy today. His labs and vitals have been reviewed Risks and Benefits discussed with the patient including, but not limited to bleeding, infection, damage to adjacent structures or low yield requiring additional tests. All of the patient's questions were answered, patient is agreeable to proceed. Consent signed and in chart.   Thank you for this interesting consult.  I greatly  enjoyed meeting Nicholas Suarez and look forward to participating in their care.  A copy of this report was sent to the requesting provider on this date.  Electronically Signed: Henreitta Cea 11/10/2016, 8:36 AM   I spent a total of  30 Minutes   in face to face in clinical consultation, greater than 50% of which was counseling/coordinating care for RTP LAD

## 2016-11-10 NOTE — Discharge Instructions (Signed)

## 2016-11-12 ENCOUNTER — Encounter: Payer: Self-pay | Admitting: Internal Medicine

## 2016-11-14 ENCOUNTER — Ambulatory Visit (INDEPENDENT_AMBULATORY_CARE_PROVIDER_SITE_OTHER): Payer: Federal, State, Local not specified - PPO | Admitting: Internal Medicine

## 2016-11-14 ENCOUNTER — Telehealth: Payer: Self-pay | Admitting: Internal Medicine

## 2016-11-14 ENCOUNTER — Encounter: Payer: Self-pay | Admitting: Internal Medicine

## 2016-11-14 VITALS — BP 128/84 | HR 82 | Ht 75.0 in | Wt 213.0 lb

## 2016-11-14 DIAGNOSIS — M7989 Other specified soft tissue disorders: Secondary | ICD-10-CM | POA: Diagnosis not present

## 2016-11-14 NOTE — Progress Notes (Signed)
   Subjective:    Patient ID: Nicholas Suarez, male    DOB: 09-02-1950, 66 y.o.   MRN: [id number]  HPI  Here to f/u recent CT lymph node biopsy results, which unfortunately are not resulted.  Has hx of dec 2017 umbilical hernia repair, then in April noted some change in bowel habits with incontinence, has seen Dr Medoff/GI with rx per pt of higher fiber.  On May 11 had MRI LS Spine apparently with some suggestion of abd LA, has also seen PCP - Dr Ronnald Ramp who ordered CT guided LN biopsy, done June 1.  Pt eager to know results, Dr Ronnald Ramp is out of office, so here today.  Unfortunately I am unable to give specific results and any further evaluation or tx will depend on results.  Has also seen Urology and is scheduled for cysto with biopsy soon.  Complicating matter is unusual RLE and scrotal swelling without pain, fever, redness that he is wondering is lymphedema he has read about  Denies fever or recent wt loss, in fact may have gained several lbs. Past Medical History:  Diagnosis Date  . Asthma    Past Surgical History:  Procedure Laterality Date  . HERNIA REPAIR      reports that he quit smoking about 28 years ago. His smoking use included Cigarettes, Pipe, and Cigars. He has a 10.00 pack-year smoking history. He quit smokeless tobacco use about 23 years ago. His smokeless tobacco use included Chew. He reports that he does not drink alcohol or use drugs. family history includes Alcohol abuse in his father and other; Arthritis in his other; Cancer in his other; Drug abuse in his other; Heart disease in his other. No Known Allergies No current outpatient prescriptions on file prior to visit.   No current facility-administered medications on file prior to visit.    Review of Systems All otherwise neg per pt    Objective:   Physical Exam BP 128/84   Pulse 82   Ht 6\' 3"  (1.905 m)   Wt 213 lb (96.6 kg)   SpO2 98%   BMI 26.62 kg/m  VS noted, not ill appearing Constitutional: Pt appears in  NAD HENT: Head: NCAT.  Right Ear: External ear normal.  Left Ear: External ear normal.  Eyes: . Pupils are equal, round, and reactive to light. Conjunctivae and EOM are normal Nose: without d/c or deformity Neck: Neck supple. Gross normal ROM Cardiovascular: Normal rate and regular rhythm.   Pulmonary/Chest: Effort normal and breath sounds without rales or wheezing.  Abd:  Soft, NT, ND, + BS, no organomegaly Neurological: Pt is alert. At baseline orientation, motor grossly intact Skin: Skin is warm. No rashes, other new lesions, has 3+ diffuse RLE edema only, without tenderness or skin changes Psychiatric: Pt behavior is normal without agitation  No other exam findings     Assessment & Plan:

## 2016-11-14 NOTE — Patient Instructions (Signed)
Please continue all other medications as before, and refills have been done if requested.  Please have the pharmacy call with any other refills you may need.  Please keep your appointments with your specialists as you may have planned - urology  You will be contacted regarding the referral for: Venous Doppler test for the right leg (to make sure no blood clot involved)  Please make sure to call tomorrow or the next day if you have not heard from Dr Ronnald Ramp about the biopsy results ( or make appt by the end of the week with Dr Ronnald Ramp)

## 2016-11-14 NOTE — Telephone Encounter (Signed)
Patient Name: Nicholas Suarez  DOB: [birth year]    Initial Comment Had a physical and was sent in for a biopsy for Friday - His knee is swelled up twice as big as it was this morning. Left leg is fine. Has some groin swelling as well. No pain. It is uncomfortable to walk.    Nurse Assessment  Nurse: Thad Ranger RN, Denise Date/Time (Eastern Time): 11/14/2016 4:07:07 PM  Confirm and document reason for call. If symptomatic, describe symptoms. ---Had a physical and was sent in for a biopsy for Friday - His knee is swelled up twice as big as it was this morning. Left leg is fine. Has some groin swelling as well. No pain. It is uncomfortable to walk.  Does the patient have any new or worsening symptoms? ---Yes  Will a triage be completed? ---Yes  Related visit to physician within the last 2 weeks? ---Yes  Does the PT have any chronic conditions? (i.e. diabetes, asthma, etc.) ---No  Is this a behavioral health or substance abuse call? ---No     Guidelines    Guideline Title Affirmed Question Affirmed Notes  Leg Swelling and Edema SEVERE leg swelling (e.g., swelling extends above knee, entire leg is swollen, weeping fluid)    Final Disposition User   See Physician within 4 Hours (or PCP triage) Thad Ranger, RN, Langley Gauss    Comments  Appt made for pt w/Dr Cathlean Cower at 1730, 11/14/16. Pt aware/agreeable.   Referrals  REFERRED TO PCP OFFICE   Disagree/Comply: Comply

## 2016-11-16 ENCOUNTER — Encounter: Payer: Self-pay | Admitting: Internal Medicine

## 2016-11-16 NOTE — Telephone Encounter (Signed)
Nicholas Suarez -   Please remember to review CT lymph node biopsy results with pt when available, as pt is very anxious for results;  They are currently not back yet, and I am out of the office Friday June 8  Thanks Willaim Bane

## 2016-11-17 ENCOUNTER — Encounter: Payer: Self-pay | Admitting: Internal Medicine

## 2016-11-17 ENCOUNTER — Other Ambulatory Visit: Payer: Self-pay | Admitting: Internal Medicine

## 2016-11-17 DIAGNOSIS — C801 Malignant (primary) neoplasm, unspecified: Secondary | ICD-10-CM

## 2016-11-18 NOTE — Assessment & Plan Note (Addendum)
D/w pt, will ask for RLE venous doppler to r/o DVT but I have high suspicion this is due to venous/lymph insufficiency related to retroperitoneal involvement, etiology of which is still pending.  Any further evaluation or tx is dependent on pathology, not yet refurned,  Pt is frustrated but will need to let pt know as soon as results available.

## 2016-11-19 ENCOUNTER — Encounter: Payer: Self-pay | Admitting: Internal Medicine

## 2016-11-22 ENCOUNTER — Ambulatory Visit (HOSPITAL_BASED_OUTPATIENT_CLINIC_OR_DEPARTMENT_OTHER): Payer: Federal, State, Local not specified - PPO | Admitting: Nurse Practitioner

## 2016-11-22 ENCOUNTER — Telehealth: Payer: Self-pay | Admitting: Nurse Practitioner

## 2016-11-22 VITALS — BP 124/72 | HR 83 | Temp 98.1°F | Resp 18 | Ht 75.0 in | Wt 211.7 lb

## 2016-11-22 DIAGNOSIS — C801 Malignant (primary) neoplasm, unspecified: Secondary | ICD-10-CM

## 2016-11-22 DIAGNOSIS — R609 Edema, unspecified: Secondary | ICD-10-CM | POA: Diagnosis not present

## 2016-11-22 DIAGNOSIS — R590 Localized enlarged lymph nodes: Secondary | ICD-10-CM

## 2016-11-22 NOTE — Progress Notes (Addendum)
[place]  [phone] Fax:([phone]) [place] Note   Patient Care Team: Nicholas Lima, MD as PCP - General (Internal Medicine) 11/22/2016  CHIEF COMPLAINTS/PURPOSE OF CONSULTATION:  Metastatic adenocarcinoma  HISTORY OF PRESENTING ILLNESS:  Nicholas Suarez is a 66 year old man with past medical history significant for asthma. He reports undergoing abdominal hernia surgery in December 2017. In late March, early April 2018 he noted a change in bowel habits with urgency. He also noted periodic urinary incontinence. He reports being seen by a "spine doctor." He was found to have right leg edema. He was referred for an MRI of the lumbar spine 10/20/2016 with findings of bilateral hydronephrosis and retroperitoneal adenopathy. He was noted to have disc and facet disease at L4-5 and L5-S1. CT of the abdomen/pelvis also on 10/20/2016 showed extensive retroperitoneal process with marked interstitial thickening and lymphadenopathy; bilateral hydronephrosis likely due to involvement of both ureters by the retroperitoneal process; similar ill-defined soft tissue density and skin thickening around the umbilicus; diffuse bladder wall thickening slightly asymmetric at the bladder dome but no obvious bladder mass. He was referred to urology and has seen Dr. Karsten Ro. He reports a recent cystoscopy showed an abnormality at the dome of the bladder and he is scheduled for a biopsy on 12/11/2016. He has seen Dr. Earlean Shawl, gastroenterology. Altered bowel habits felt to most likely be functional. His last colonoscopy was 04/08/2012 with findings of left colon polyps and internal hemorrhoids. Pathology on the colon polyp(s) showed hyperplastic polyp with no adenomatous change or malignancy. He underwent CT-guided biopsy of left-sided retroperitoneal adenopathy on 11/10/2016 with pathology showing metastatic adenocarcinoma consistent with a GI primary.  MEDICAL HISTORY:  Past  Medical History:  Diagnosis Date  . Asthma     SURGICAL HISTORY: Past Surgical History:  Procedure Laterality Date  . HERNIA REPAIR      SOCIAL HISTORY: He lives in [place]. He is married. 2 stepchildren. He is retired from the [employer]. He works as a Social worker. He quit smoking cigarettes in the 1990s. He estimates smoking for 10-15 years. This was followed by cigars. He no longer smokes. 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 No alcohol since 10/10/1988. At that time he was drinking 2-4 sixpacks per day.  FAMILY HISTORY: Family History  Problem Relation Age of Onset  . Alcohol abuse Father   . Heart disease Other   . Cancer Other        Breast Cancer  . Arthritis Other   . Alcohol abuse Other   . Drug abuse Other   . Stroke Neg Hx   . Kidney disease Neg Hx   . Hypertension Neg Hx   . Hyperlipidemia Neg Hx   . Early death Neg Hx     ALLERGIES:  has No Known Allergies.  MEDICATIONS:  Current Outpatient Prescriptions  Medication Sig Dispense Refill  . glucosamine-chondroitin 500-400 MG tablet Take 1 tablet by mouth 3 (three) times daily.    . Multiple Vitamin (MULTIVITAMIN) tablet Take 1 tablet by mouth daily.    . Omega-3 Fatty Acids (FISH OIL) 1200 MG CAPS Take by mouth.    . saw palmetto 80 MG capsule Take 80 mg by mouth 2 (two) times daily.    Marland Kitchen aspirin 325 MG tablet Take 325 mg by mouth daily.     No current facility-administered medications for this visit.     REVIEW OF SYSTEMS:   Constitutional: Denies fevers, chills. He notes sweating  in the groin regions.No weight loss or anorexia. Eyes: Denies blurriness of vision, double vision or watery eyes.  Ears, nose, mouth, throat, and face: Denies mucositis or sore throat Respiratory: Denies cough, dyspnea or wheezes Cardiovascular: Denies palpitation, chest discomfort. He has right leg edema. Gastrointestinal:  Denies nausea, heartburn. Change in bowel habits late March/early April 2018  with urgency. He now notes some difficulty eliminating stool. No bloody or black bowel movements. Skin: Denies abnormal skin rashes Lymphatics: Denies lymphadenopathy  Neurological:Denies numbness, tingling or new weaknesses Behavioral/Psych: Mood is stable, no new changes  GU: Testicular swelling. All other systems were reviewed with the patient and are negative.  PHYSICAL EXAMINATION: ECOG PERFORMANCE STATUS: 0  Vitals:   11/22/16 1444  BP: 124/72  Pulse: 83  Resp: 18  Temp: 98.1 F (36.7 C)   Filed Weights   11/22/16 1444  Weight: 211 lb 11.2 oz (96 kg)    GENERAL: alert, no distress and comfortable SKIN: skin color, texture, turgor are normal, no rashes or significant lesions EYES: normal, conjunctiva are pink and non-injected, sclera clear OROPHARYNX:no exudate, no erythema and lips, buccal mucosa, and tongue normal  NECK: supple, thyroid normal size, non-tender, without nodularity LYMPH:  No palpable cervical, right supraclavicular, axillary or inguinal lymph nodes. Small, firm left scalene/supraclavicular lymph node. LUNGS: clear to auscultation and percussion with normal breathing effort HEART: regular rate & rhythm and no murmurs. Right leg is edematous throughout. ABDOMEN:abdomen soft, non-tender and normal bowel sounds Musculoskeletal:no cyanosis of digits and no clubbing GU: Scrotal edema.  PSYCH: alert & oriented x 3 with fluent speech NEURO: no focal motor/sensory deficits  LABORATORY DATA:  I have reviewed the data as listed CBC Latest Ref Rng & Units 11/10/2016 11/01/2016 04/09/2014  WBC 4.0 - 10.5 K/uL 6.2 5.9 5.8  Hemoglobin 13.0 - 17.0 g/dL 13.0 12.7(L) 13.9  Hematocrit 39.0 - 52.0 % 38.5(L) 37.9(L) 42.8  Platelets 150 - 400 K/uL 179 180.0 198.0     RADIOGRAPHIC STUDIES: I have personally reviewed the radiological images as listed and agreed with the findings in the report. Ct Biopsy  Result Date: 11/10/2016 INDICATION: 66 year old male with a  history retroperitoneal lymphadenopathy EXAM: CT BIOPSY MEDICATIONS: None. ANESTHESIA/SEDATION: Moderate (conscious) sedation was employed during this procedure. A total of Versed 4.0 mg and Fentanyl 100 mcg was administered intravenously. Moderate Sedation Time: 15 minutes. The patient's level of consciousness and vital signs were monitored continuously by radiology nursing throughout the procedure under my direct supervision. FLUOROSCOPY TIME:  CT COMPLICATIONS: None PROCEDURE: Informed written consent was obtained from the patient after a thorough discussion of the procedural risks, benefits and alternatives. All questions were addressed. Maximal Sterile Barrier Technique was utilized including caps, mask, sterile gowns, sterile gloves, sterile drape, hand hygiene and skin antiseptic. A timeout was performed prior to the initiation of the procedure. Patient position prone position on CT table. Scout CT images of the abdomen were poor performed for planning purposes. The patient is then prepped and draped in usual sterile fashion. The skin and subcutaneous tissues were generously infiltrated 1% lidocaine for local anesthesia. Using CT guidance, 20 cm 17 gauge guide needle was advanced to the left-sided retroperitoneal lymph nodes. Once we confirmed location of the needle tip, multiple 18 gauge core biopsy were acquired. Patient tolerated the procedure well and remained hemodynamically stable throughout. No complications were encountered and no significant blood loss. IMPRESSION: Status post CT-guided biopsy of left-sided retroperitoneal adenopathy. Tissue specimen sent to pathology for complete histopathologic analysis. Signed,  Nicholas Suarez. Nicholas Newport, Nicholas Suarez [facility] Electronically Signed   By: Nicholas Suarez D.O.   On: 11/10/2016 10:22    ASSESSMENT & PLAN:  1. Metastatic adenocarcinoma, unknown primary  MRI lumbar spine 10/20/2016 with bilateral  hydronephrosis and retroperitoneal adenopathy  CT abdomen/pelvis 10/20/2016 with extensive retroperitoneal process with marked interstitial thickening and lymphadenopathy; bilateral hydronephrosis; similar ill-defined soft tissue density and skin thickening around the umbilicus; diffuse bladder wall thickening slightly asymmetric at the bladder dome but no obvious bladder mass  Biopsy para-aortic lymph node 11/10/2016-metastatic adenocarcinoma consistent with GI primary 2. Retroperitoneal lymphadenopathy secondary to #1 3. Bilateral hydronephrosis status post evaluation by urology 4. Asymmetry of the bladder dome on CT 10/20/2016 status post cystoscopy with biopsy planned 12/11/2016 (Dr. Karsten Ro) 5. Change in bowel habits, question secondary to #1 6. Right leg edema, question due to lymphatic obstruction  Nicholas Suarez is a 66 year old man recently diagnosed with metastatic adenocarcinoma involving retroperitoneal adenopathy, unknown primary.  A gastrointestinal primary is suspected. We are referring him for upper and lower endoscopy and a PET scan. We will obtain tumor markers to include a CEA and CA-19-9.  The right leg edema may be due to lymphatic obstruction. We are referring him for a venous Doppler to rule out DVT.  He will follow-up with Dr. Karsten Ro regarding the abnormality of the bladder dome.  He will return for a follow-up visit next week to review the outstanding data and establish a treatment plan.    Patient seen with Dr. Burr Medico.       Ned Card, NP 11/22/2016 3:34 PM  Addendum I have seen the patient, examined him. I agree with the assessment and and plan and have edited the notes.   66 year old male, presented with fatigue, bowel habits change, occasional urinary incontinence, and right lower extremity edema, CT scan revealed extensive retroperitoneal adenopathy, biopsy showed metastatic adenocarcinoma, immunochemistry studies favor GI primary, especially low GI.  Patient has not had colonoscopy for 5 years. Laboratory data reviewed, no anemia, normal liver and kidney functions. I recommend a PET scan for further evaluation, to look for primary and ruled out other metastatic disease. I recommend urgent endoscopy, patient was seen by Dr. Earlean Shawl before, but prefers to be seen at [facility] now. Urgent consult will be requested, and I'll personally communicate with the [place] doctors to get him scheduled. Lower extremity Doppler is scheduled for tomorrow to rule out DVT.  He will return to our clinic next week after the above workup, and finalize his treatment plan. If this is colon cancer, it's likely incurable disease due to the extensive retroperitoneal node metastasis.   Nicholas Suarez  11/22/2016

## 2016-11-22 NOTE — Telephone Encounter (Signed)
Scheduled appt per 6/13 los - Gave patient AVS and calender per 6/13 los.

## 2016-11-23 ENCOUNTER — Encounter: Payer: Self-pay | Admitting: Nurse Practitioner

## 2016-11-23 ENCOUNTER — Telehealth: Payer: Self-pay | Admitting: Gastroenterology

## 2016-11-23 ENCOUNTER — Telehealth: Payer: Self-pay | Admitting: Nurse Practitioner

## 2016-11-23 ENCOUNTER — Telehealth: Payer: Self-pay

## 2016-11-23 ENCOUNTER — Other Ambulatory Visit: Payer: Self-pay | Admitting: Internal Medicine

## 2016-11-23 ENCOUNTER — Ambulatory Visit (AMBULATORY_SURGERY_CENTER): Payer: Self-pay

## 2016-11-23 ENCOUNTER — Other Ambulatory Visit (HOSPITAL_BASED_OUTPATIENT_CLINIC_OR_DEPARTMENT_OTHER): Payer: Federal, State, Local not specified - PPO

## 2016-11-23 ENCOUNTER — Other Ambulatory Visit: Payer: Self-pay

## 2016-11-23 ENCOUNTER — Encounter: Payer: Self-pay | Admitting: *Deleted

## 2016-11-23 ENCOUNTER — Ambulatory Visit (HOSPITAL_COMMUNITY)
Admission: RE | Admit: 2016-11-23 | Discharge: 2016-11-23 | Disposition: A | Discharge status: Home / Self Care | Payer: Federal, State, Local not specified - PPO | Source: Ambulatory Visit | Attending: Nurse Practitioner | Admitting: Nurse Practitioner

## 2016-11-23 VITALS — Ht 75.0 in | Wt 213.0 lb

## 2016-11-23 DIAGNOSIS — C801 Malignant (primary) neoplasm, unspecified: Secondary | ICD-10-CM | POA: Diagnosis not present

## 2016-11-23 DIAGNOSIS — N1330 Unspecified hydronephrosis: Secondary | ICD-10-CM

## 2016-11-23 DIAGNOSIS — R590 Localized enlarged lymph nodes: Secondary | ICD-10-CM | POA: Diagnosis not present

## 2016-11-23 DIAGNOSIS — M7989 Other specified soft tissue disorders: Secondary | ICD-10-CM | POA: Diagnosis not present

## 2016-11-23 LAB — COMPREHENSIVE METABOLIC PANEL
ALT: 9 U/L (ref 0–55)
AST: 16 U/L (ref 5–34)
Albumin: 3.4 g/dL — ABNORMAL LOW (ref 3.5–5.0)
Alkaline Phosphatase: 69 U/L (ref 40–150)
Anion Gap: 9 mEq/L (ref 3–11)
BUN: 15.7 mg/dL (ref 7.0–26.0)
CO2: 28 meq/L (ref 22–29)
Calcium: 10.4 mg/dL (ref 8.4–10.4)
Chloride: 105 mEq/L (ref 98–109)
Creatinine: 1.3 mg/dL (ref 0.7–1.3)
EGFR: 58 mL/min/{1.73_m2} — AB (ref >90)
GLUCOSE: 98 mg/dL (ref 70–140)
POTASSIUM: 4.2 meq/L (ref 3.5–5.1)
SODIUM: 142 meq/L (ref 136–145)
Total Bilirubin: 0.47 mg/dL (ref 0.20–1.20)
Total Protein: 6.6 g/dL (ref 6.4–8.3)

## 2016-11-23 LAB — CBC WITH DIFFERENTIAL/PLATELET
BASO%: 1.1 % (ref 0.0–2.0)
Basophils Absolute: 0.1 10*3/uL (ref 0.0–0.1)
EOS%: 6.2 % (ref 0.0–7.0)
Eosinophils Absolute: 0.3 10*3/uL (ref 0.0–0.5)
HEMATOCRIT: 35.9 % — AB (ref 38.4–49.9)
HEMOGLOBIN: 12 g/dL — AB (ref 13.0–17.1)
LYMPH%: 17.9 % (ref 14.0–49.0)
MCH: 29.4 pg (ref 27.2–33.4)
MCHC: 33.5 g/dL (ref 32.0–36.0)
MCV: 87.8 fL (ref 79.3–98.0)
MONO#: 0.4 10*3/uL (ref 0.1–0.9)
MONO%: 7.3 % (ref 0.0–14.0)
NEUT#: 3.8 10*3/uL (ref 1.5–6.5)
NEUT%: 67.5 % (ref 39.0–75.0)
Platelets: 199 10*3/uL (ref 140–400)
RBC: 4.09 10*6/uL — ABNORMAL LOW (ref 4.20–5.82)
RDW: 12.9 % (ref 11.0–14.6)
WBC: 5.6 10*3/uL (ref 4.0–10.3)
lymph#: 1 10*3/uL (ref 0.9–3.3)

## 2016-11-23 LAB — CEA (IN HOUSE-CHCC): CEA (CHCC-In House): 669.48 ng/mL — ABNORMAL HIGH (ref 0.00–5.00)

## 2016-11-23 MED ORDER — NA SULFATE-K SULFATE-MG SULF 17.5-3.13-1.6 GM/177ML PO SOLN: 1.0000 | Freq: Once | ORAL | 0 refills | Status: AC

## 2016-11-23 NOTE — Telephone Encounter (Signed)
Left message for pre-visit to have patient do a BMP today also that this is a workin appointment, if patient has already had this done today then no need to repeat. Dr. Loletha Carrow wanted these results. Patient called today and is aware to come in for pre-visit today at 2:00 and endocolon on 6/18.

## 2016-11-23 NOTE — Telephone Encounter (Signed)
Almyra Free,  Below is a copy of staff message from Oncology regarding this patient ___________________________________________________ Michae Kava and Ulice Dash,   Can any of you or your partner get this pt in for colonoscopy and possible EGD next week? He has bowel habits change, extensive retroperitoneal adenopathy, and biopsy showed metastatic adenocarcinoma, favor low GI primary. His last colonoscopy was 5 years ago. He was recently seen by Dr. Earlean Shawl, but prefers to see you guys if he can been seen soon.   I have referred him yesterday.   Thanks much for your help,   Krista Blue   ______________________________________________________  I would like to put him in next Thursday, June 21st for an EGD and colonoscopy.  It will mean a slight shift of one or two AM slots to make an hour block for this.  Please call him today to let him know, and see him next Monday in office to give timing/instructions.  Please note, I am asking his PCP or oncologist to get a BMP today or tomorrow because his CT scan shows ureteral obstruction and we need to see updated renal function before proceeding with scopes.  - HD   Dr. Ronnald Ramp and Burr Medico,    Please see above and get a BMP today or tomorrow.  Herma Ard GI

## 2016-11-23 NOTE — Telephone Encounter (Signed)
Dr Loletha Carrow has stepped up with a plan. He will be put in for a direct on 11/30/16.

## 2016-11-23 NOTE — Telephone Encounter (Signed)
Confirmed 6/21 appt at 1515 per sch msg

## 2016-11-23 NOTE — Progress Notes (Signed)
**  Preliminary report by tech**   Right lower extremity venous duplex complete. There is no evidence of deep or superficial vein thrombosis involving the right lower extremity. All visualized vessels appear patent and compressible. There is no evidence of a Baker's cyst on the right. Results were given to [place] at Ned Card' NP office.  11/23/16 11:13 AM Carlos Levering RVT

## 2016-11-23 NOTE — Telephone Encounter (Signed)
Patient is scheduled for pre-visit today and endocolon on 6/18, there was an hour block of time.

## 2016-11-23 NOTE — Telephone Encounter (Signed)
Ned Card, NP noted in referral -New diagnosis met adeno, unknown primary, GI primary suspected---refer for upper and lower endoscopy. Please advise as to scheduling.

## 2016-11-23 NOTE — Progress Notes (Signed)
Notified by Marya Amsler in vascular lab- patient is negative for a DVT in (R) LE. Eliot Ford, NP aware.

## 2016-11-23 NOTE — Progress Notes (Signed)
Denies allergies to eggs or soy products. Denies complication of anesthesia or sedation. Denies use of weight loss medication. Denies use of O2.   Emmi instructions given for colonoscopy.  

## 2016-11-24 LAB — CANCER ANTIGEN 19-9: CA 19-9: 3 U/mL (ref 0–35)

## 2016-11-27 ENCOUNTER — Telehealth: Payer: Self-pay | Admitting: *Deleted

## 2016-11-27 ENCOUNTER — Ambulatory Visit (AMBULATORY_SURGERY_CENTER): Payer: Federal, State, Local not specified - PPO | Admitting: Gastroenterology

## 2016-11-27 ENCOUNTER — Encounter: Payer: Self-pay | Admitting: Gastroenterology

## 2016-11-27 VITALS — BP 134/83 | HR 59 | Temp 98.9°F | Resp 9 | Ht 75.0 in | Wt 213.0 lb

## 2016-11-27 DIAGNOSIS — K6289 Other specified diseases of anus and rectum: Secondary | ICD-10-CM

## 2016-11-27 DIAGNOSIS — R933 Abnormal findings on diagnostic imaging of other parts of digestive tract: Secondary | ICD-10-CM

## 2016-11-27 DIAGNOSIS — C801 Malignant (primary) neoplasm, unspecified: Secondary | ICD-10-CM | POA: Diagnosis present

## 2016-11-27 DIAGNOSIS — C20 Malignant neoplasm of rectum: Secondary | ICD-10-CM

## 2016-11-27 HISTORY — DX: Malignant neoplasm of rectum: C20

## 2016-11-27 MED ORDER — SODIUM CHLORIDE 0.9 % IV SOLN: 500.0000 mL | INTRAVENOUS | Status: DC

## 2016-11-27 NOTE — Progress Notes (Signed)
Pt's wife had a number of questions about moving up appointment and if caner has spread.  Dr. Loletha Carrow took extra time with pt and his wife explaining what he found on the EGD and colonoscopy today.  Pt's wife said that the oncologist was going on a 2 week vacation and concerned that will hold up her husbands treatment plans.  Dr. Loletha Carrow said he will get his nurse to call and make sure that the oncologist office contant her with a plan asap.  Oncologist  will be the one who contacts the pt with the results of rectal mass that were sent [facility] today.  I reasured pt, his wife and her sister that Dr. Loletha Carrow contact the oncologist's office today and have them call pt's wife ASAP.  They all three seem confident with the plan on discharge.  MAW

## 2016-11-27 NOTE — Patient Instructions (Signed)
YOU HAD AN ENDOSCOPIC PROCEDURE TODAY AT [facility]:   Refer to the procedure report that was given to you for any specific questions about what was found during the examination.  If the procedure report does not answer your questions, please call your gastroenterologist to clarify.  If you requested that your care partner not be given the details of your procedure findings, then the procedure report has been included in a sealed envelope for you to review at your convenience later.  YOU SHOULD EXPECT: Some feelings of bloating in the abdomen. Passage of more gas than usual.  Walking can help get rid of the air that was put into your GI tract during the procedure and reduce the bloating. If you had a lower endoscopy (such as a colonoscopy or flexible sigmoidoscopy) you may notice spotting of blood in your stool or on the toilet paper. If you underwent a bowel prep for your procedure, you may not have a normal bowel movement for a few days.  Please Note:  You might notice some irritation and congestion in your nose or some drainage.  This is from the oxygen used during your procedure.  There is no need for concern and it should clear up in a day or so.  SYMPTOMS TO REPORT IMMEDIATELY:   Following lower endoscopy (colonoscopy or flexible sigmoidoscopy):  Excessive amounts of blood in the stool  Significant tenderness or worsening of abdominal pains  Swelling of the abdomen that is new, acute  Fever of 100F or higher   Following upper endoscopy (EGD)  Vomiting of blood or coffee ground material  New chest pain or pain under the shoulder blades  Painful or persistently difficult swallowing  New shortness of breath  Fever of 100F or higher  Black, tarry-looking stools  For urgent or emergent issues, a gastroenterologist can be reached at any hour by calling [phone].   DIET:  We do recommend a small meal at first, but then you may proceed to your regular diet.  Drink  plenty of fluids but you should avoid alcoholic beverages for 24 hours.  ACTIVITY:  You should plan to take it easy for the rest of today and you should NOT DRIVE or use heavy machinery until tomorrow (because of the sedation medicines used during the test).    FOLLOW UP: Our staff will call the number listed on your records the next business day following your procedure to check on you and address any questions or concerns that you may have regarding the information given to you following your procedure. If we do not reach you, we will leave a message.  However, if you are feeling well and you are not experiencing any problems, there is no need to return our call.  We will assume that you have returned to your regular daily activities without incident.  If any biopsies were taken you will be contacted by phone or by letter within the next 1-3 weeks.  Please call us at [phone] if you have not heard about the biopsies in 3 weeks.    SIGNATURES/CONFIDENTIALITY: You and/or your care partner have signed paperwork which will be entered into your electronic medical record.  These signatures attest to the fact that that the information above on your After Visit Summary has been reviewed and is understood.  Full responsibility of the confidentiality of this discharge information lies with you and/or your care-partner.    Add MIRALAX (over-the-counter) 1 capfil (17 grams) in 8  ounces of water by mouth daily. You may resume your other current medications today. Await biopsy results. Please call if any questions or concerns.

## 2016-11-27 NOTE — Progress Notes (Signed)
Called to room to assist during endoscopic procedure.  Patient ID and intended procedure confirmed with present staff. Received instructions for my participation in the procedure from the performing physician.  

## 2016-11-27 NOTE — Progress Notes (Signed)
Report given to PACU, vss 

## 2016-11-27 NOTE — Telephone Encounter (Signed)
Spoke with pt and confirmed aptt date and time for Thurs 11/30/16 at 315 pm with Lattie Haw, NP.   Pt stated he was aware of appt.

## 2016-11-27 NOTE — Op Note (Signed)
[facility] Patient Name: Nicholas Suarez Procedure Date: 11/27/2016 9:47 AM MRN: [id number] Endoscopist: Mallie Mussel L. 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 The patient's tolerance of                            previous anesthesia was also reviewed. The risks                            and benefits of the procedure and the sedation                            options and risks were discussed with the patient.                            All questions were answered, and informed consent                            was obtained. Anticoagulants: The patient has taken                            aspirin. It was decided not to withhold this                            medication prior to the procedure. ASA Grade                            Assessment: II - A patient with mild systemic                            disease. After reviewing the risks and benefits,                            the patient was deemed in satisfactory condition to  undergo the procedure.                           After obtaining informed consent, the endoscope was                            passed under direct vision. Throughout the                            procedure, the patient's blood pressure, pulse, and                            oxygen saturations were monitored continuously. The                            Model GIF-HQ190 [id number]) scope was  introduced                            through the mouth, and advanced to the second part                            of duodenum. The upper GI endoscopy was                            accomplished without difficulty. The patient                            tolerated the procedure well. Scope In: Scope Out: Findings:                 The larynx was normal.                           The esophagus was normal.                           The stomach was normal.                           The cardia and gastric fundus were normal on                            retroflexion.                           The examined duodenum was normal. Complications:            No immediate complications. Estimated Blood Loss:     Estimated blood loss: none. Impression:               - Normal larynx.                           - Normal esophagus.                           - Normal stomach.                           -  Normal examined duodenum.                           - No specimens collected. Recommendation:           - Patient has a contact number available for                            emergencies. The signs and symptoms of potential                            delayed complications were discussed with the                            patient. Return to normal activities tomorrow.                            Written discharge instructions were provided to the                            patient.                           - Resume previous diet.                           - Continue present medications.                           - See the other procedure note for documentation of                            additional recommendations. Henry L. Loletha Carrow, MD 11/27/2016 10:24:12 AM This report has been signed electronically.

## 2016-11-27 NOTE — Progress Notes (Signed)
Pt's states no medical or surgical changes since previsit or office visit. 

## 2016-11-27 NOTE — Op Note (Signed)
[facility] Patient Name: Nicholas Suarez Procedure Date: 11/27/2016 9:47 AM MRN: [id number] Endoscopist: Mallie Mussel L. 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 The patient's tolerance of                            previous anesthesia was also reviewed. The risks                            and benefits of the procedure and the sedation                            options and risks were discussed with the patient.                            All questions were answered, and informed consent                            was obtained. Anticoagulants: The patient has taken                            aspirin. It was decided not to withhold this                            medication prior to the procedure. ASA Grade                            Assessment: II - A patient with mild systemic                            disease. After reviewing the risks and benefits,                            the patient was deemed in satisfactory condition to  undergo the procedure.                           After obtaining informed consent, the colonoscope                            was passed under direct vision. Throughout the                            procedure, the patient's blood pressure, pulse, and                            oxygen saturations were monitored continuously. The                            Colonoscope was introduced through the anus  and                            advanced to the the sigmoid colon. The Endoscope                            was introduced through the and advanced to the                            sigmoid colon. The colonoscopy was performed with                            moderate difficulty due to restricted mobility of                            the colon. The procedure could not be completed                            even after withdrawing the scope and replacing with                            the adult endoscope. The patient tolerated the                            procedure well. The quality of the bowel                            preparation was good. The rectum was photographed. Scope In: 10:01:44 AM Scope Out: 10:17:48 AM Total Procedure Duration: 0 hours 16 minutes 4 seconds  Findings:                 The digital rectal exam findings include an                            Anterior/right lateral PALPABLE RECTAL MASS.                           A fungating and infiltrative non-obstructing mass  was found in the distal rectum, involving the anal                            verge. The mass was partially circumferential                            (involving one-third of the lumen circumference).                            No bleeding was present. This was biopsied with a                            cold forceps for histology.                           The rectum was fixed and frozen in the pelvis,                            precluding passage of either scope beyond the                            distal sigmoid colon. Complications:            No immediate complications. Estimated Blood Loss:     Estimated blood loss was minimal. Impression:               - Palpable rectal mass found on digital rectal exam.                           - Likely malignant tumor in the rectum. Biopsied.                           - Malignant-appearing tumor in the colon. Recommendation:           -  Patient has a contact number available for                            emergencies. The signs and symptoms of potential                            delayed complications were discussed with the                            patient. Return to normal activities tomorrow.                            Written discharge instructions were provided to the                            patient.                           - Resume previous diet.                           - Continue present medications.                           -  Await pathology results.                           - Repeat colonoscopy is recommended for                            surveillance . The colonoscopy date will be                            determined after pathology results from today's                            exam become available for review and treatment plan                            is clear.                           - Miralax 1 capful (17 grams) in 8 ounces of water                            PO daily.  L. Loletha Carrow, MD 11/27/2016 10:35:38 AM This report has been signed electronically. CC Letter to:             Janith Lima, MD

## 2016-11-28 ENCOUNTER — Telehealth: Payer: Self-pay | Admitting: Gastroenterology

## 2016-11-28 ENCOUNTER — Telehealth: Payer: Self-pay | Admitting: *Deleted

## 2016-11-28 ENCOUNTER — Other Ambulatory Visit: Payer: Self-pay | Admitting: Nurse Practitioner

## 2016-11-28 ENCOUNTER — Telehealth: Payer: Self-pay

## 2016-11-28 DIAGNOSIS — C801 Malignant (primary) neoplasm, unspecified: Secondary | ICD-10-CM

## 2016-11-28 NOTE — Telephone Encounter (Signed)
Telephone call to patient to advise port placement scheduled. Patient states the IR department has already called to set this up. Patient is anxious to discuss the treatment plan on Thursday with Dr. Benay Spice. Patient appreciated call and looks forward to his appointment.

## 2016-11-28 NOTE — Telephone Encounter (Signed)
-----   Message from [name], NP sent at 11/28/2016  9:18 AM EDT ----- Please let Nicholas Suarez and his wife know that we are referring him for placement of a Port-A-Cath in anticipation of chemotherapy. We will discuss the specifics of the chemotherapy at his visit this week.

## 2016-11-28 NOTE — Telephone Encounter (Signed)
Nicholas Suarez,  Dr. 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  I spoke with Sonia Side by phone this AM to inform him.  He is looking forward to seeing you this Thursday.  Thanks.

## 2016-11-28 NOTE — Telephone Encounter (Signed)
  Follow up Call-  Call back number 11/27/2016  Post procedure Call Back phone  # [phone]  Permission to leave phone message Yes  Some recent data might be hidden     Patient questions:  Do you have a fever, pain , or abdominal swelling? No. Pain Score  0 *  Have you tolerated food without any problems? Yes.    Have you been able to return to your normal activities? Yes.    Do you have any questions about your discharge instructions: Diet   No. Medications  No. Follow up visit  No.  Do you have questions or concerns about your Care? No.  Actions: * If pain score is 4 or above: No action needed, pain <4.  No problems or questions noted per pt. maw

## 2016-11-30 ENCOUNTER — Ambulatory Visit (HOSPITAL_BASED_OUTPATIENT_CLINIC_OR_DEPARTMENT_OTHER): Payer: Federal, State, Local not specified - PPO | Admitting: Nurse Practitioner

## 2016-11-30 ENCOUNTER — Encounter: Payer: Self-pay | Admitting: Nurse Practitioner

## 2016-11-30 ENCOUNTER — Telehealth: Payer: Self-pay | Admitting: Nurse Practitioner

## 2016-11-30 DIAGNOSIS — N1330 Unspecified hydronephrosis: Secondary | ICD-10-CM

## 2016-11-30 DIAGNOSIS — C786 Secondary malignant neoplasm of retroperitoneum and peritoneum: Secondary | ICD-10-CM | POA: Diagnosis not present

## 2016-11-30 DIAGNOSIS — R609 Edema, unspecified: Secondary | ICD-10-CM | POA: Diagnosis not present

## 2016-11-30 DIAGNOSIS — C20 Malignant neoplasm of rectum: Secondary | ICD-10-CM

## 2016-11-30 DIAGNOSIS — Z7189 Other specified counseling: Secondary | ICD-10-CM | POA: Insufficient documentation

## 2016-11-30 MED ORDER — LIDOCAINE-PRILOCAINE 2.5-2.5 % EX CREA: TOPICAL_CREAM | CUTANEOUS | 2 refills | Status: DC

## 2016-11-30 MED ORDER — PROCHLORPERAZINE MALEATE 10 MG PO TABS: 10.0000 mg | ORAL_TABLET | Freq: Four times a day (QID) | ORAL | 0 refills | Status: DC | PRN

## 2016-11-30 NOTE — Telephone Encounter (Signed)
Scheduled chemo edu class per 6/21 los - unable to schedule treatment due to availability in the treatment area -patient is already in high priority messages

## 2016-11-30 NOTE — Progress Notes (Signed)
Nicholas Suarez OFFICE PROGRESS NOTE   Diagnosis:  Metastatic adenocarcinoma  INTERVAL HISTORY:   Nicholas Suarez returns as scheduled. He has not had a bowel movement since the colonoscopy. He recently started taking Miralax. He denies any pain. He continues to have right leg and scrotal edema. He reports a good appetite.  Objective:  Vital signs in last 24 hours:  Blood pressure 124/66, pulse 77, temperature 98.2 F (36.8 C), temperature source Oral, resp. rate 18, height 6' 3" (1.905 m), weight 207 lb 1.6 oz (93.9 kg), SpO2 100 %.    Lymphatics: Firm left scalene and supraclavicular lymph nodes. Vascular: Edema throughout the right leg. Neuro: Alert and oriented.  GU: Scrotal edema.   Lab Results:  Lab Results  Component Value Date   WBC 5.6 11/23/2016   HGB 12.0 (L) 11/23/2016   HCT 35.9 (L) 11/23/2016   MCV 87.8 11/23/2016   PLT 199 11/23/2016   NEUTROABS 3.8 11/23/2016    Imaging:  No results found.  Medications: I have reviewed the patient's current medications.  Assessment/Plan: 1. Rectal cancer  MRI lumbar spine 10/20/2016 with bilateral hydronephrosis and retroperitoneal adenopathy  CT abdomen/pelvis 10/20/2016 with extensive retroperitoneal process with marked interstitial thickening and lymphadenopathy; bilateral hydronephrosis; similar ill-defined soft tissue density and skin thickening around the umbilicus; diffuse bladder wall thickening slightly asymmetric at the bladder dome but no obvious bladder mass  Biopsy para-aortic lymph node 11/10/2016-metastatic adenocarcinoma consistent with GI primary  11/23/2016 CEA elevated, CA-19-9 normal  Colonoscopy 11/27/2016-fungating infiltrative nonobstructing mass in the distal rectum involving the anal verge. The rectum was fixed and frozen in the pelvis precluding passage of scope beyond the distal sigmoid colon. 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  Upper endoscopy  11/27/2016-normal.  PET scan pending scheduled 12/01/2016 2. Retroperitoneal lymphadenopathy secondary to #1 3. Bilateral hydronephrosis status post evaluation by urology 4. Asymmetry of the bladder dome on CT 10/20/2016 status post cystoscopy with biopsy planned 12/11/2016 (Dr. Ottelin) 5. Change in bowel habits, secondary to #1 6. Right leg edema, question due to lymphatic obstruction; venous Doppler negative for DVT 11/23/2016. 7. Port-A-Cath placement scheduled 12/05/2016   Disposition: Nicholas Suarez appears stable. He has been diagnosed with metastatic rectal cancer. Dr. Sherrill reviewed the diagnosis, prognosis and treatment options with Nicholas Suarez and his family. They understand no therapy will be curative.  Dr. Sherrill recommends FOLFOX chemotherapy with plans to add Avastin beginning with cycle 2. We reviewed potential toxicities associated with chemotherapy including bone marrow toxicity, nausea, hair loss, allergic reaction. We discussed potential toxicities associated with oxaliplatin including cold sensitivity, peripheral neuropathy, acute laryngopharyngeal dysesthesia, diplopia, gait disturbance, jaw pain, incontinence. We reviewed potential toxicities associated with 5-fluorouracil including mouth sores, diarrhea, skin hyperpigmentation, hand-foot syndrome, increased sensitivity to sun, conjunctivitis. We discussed the allergic reaction, bleeding, hypertension, thromboembolic disease, bowel perforation, delayed wound healing, CNS toxicity and nephrotoxicity associated with Avastin. He is agreeable to proceed. He will attend a chemotherapy education class.  Prescriptions were sent to his pharmacy for Compazine and EMLA cream.  He is scheduled for a staging PET scan tomorrow and Port-A-Cath placement on 12/05/2016. He would like to begin the chemotherapy as soon as possible. We are scheduling him for cycle 1 FOLFOX on 12/06/2016. We will see him with cycle 2 FOLFOX plus Avastin on  12/20/2016.  We are requesting foundation 1 testing and stains for HER-2.   Patient seen with Dr. Sherrill. 35 minutes were spent face-to-face at today's visit with the majority of   that time involved in counseling/coordination of care.   Ned Card ANP/GNP-BC   11/30/2016  3:40 PM  This was a shared visit with Ned Card. Nicholas Suarez was interviewed and examined. I reviewed the CT abdomen/pelvis images from 10/20/2016. He has been diagnosed with metastatic rectal cancer. We discussed treatment options with Nicholas Suarez and his family. No therapy will be curative. I agree with the recommendation of Dr. Burr Medico to begin systemic therapy with FOLFOX. Avastin will be added beginning with cycle 2.  We will request Foundation 1 testing and HER-2 testing on the rectal biopsy.  Julieanne Manson, M.D.

## 2016-11-30 NOTE — Progress Notes (Signed)
START ON PATHWAY REGIMEN - Colorectal     A cycle is every 14 days:     Oxaliplatin      Leucovorin      5-Fluorouracil      5-Fluorouracil      Bevacizumab   **Always confirm dose/schedule in your pharmacy ordering system**    Patient Characteristics: Metastatic Colorectal, First Line, Nonsurgical Candidate, KRAS Mutation Positive/Unknown, BRAF Wild-Type/Unknown, PS = 0,1; Bevacizumab Eligible Current evidence of distant metastases? Yes AJCC T Category: Staged < 8th Ed. AJCC N Category: Staged < 8th Ed. AJCC M Category: Staged < 8th Ed. AJCC 8 Stage Grouping: Staged < 8th Ed. BRAF Mutation Status: Awaiting Test Results KRAS/NRAS Mutation Status: Awaiting Test Results Line of therapy: First Line Would you be surprised if this patient died  in the next year? I would be surprised if this patient died in the next year Performance Status: PS = 0, 1 Bevacizumab Eligibility: Eligible Intent of Therapy: Non-Curative / Palliative Intent, Discussed with Patient 

## 2016-12-01 ENCOUNTER — Telehealth: Payer: Self-pay | Admitting: Nurse Practitioner

## 2016-12-01 ENCOUNTER — Ambulatory Visit (HOSPITAL_COMMUNITY)
Admission: RE | Admit: 2016-12-01 | Discharge: 2016-12-01 | Disposition: A | Discharge status: Home / Self Care | Payer: Federal, State, Local not specified - PPO | Source: Ambulatory Visit | Attending: Nurse Practitioner | Admitting: Nurse Practitioner

## 2016-12-01 ENCOUNTER — Encounter: Payer: Self-pay | Admitting: *Deleted

## 2016-12-01 DIAGNOSIS — C801 Malignant (primary) neoplasm, unspecified: Secondary | ICD-10-CM | POA: Diagnosis present

## 2016-12-01 DIAGNOSIS — C20 Malignant neoplasm of rectum: Secondary | ICD-10-CM | POA: Diagnosis not present

## 2016-12-01 DIAGNOSIS — R590 Localized enlarged lymph nodes: Secondary | ICD-10-CM | POA: Diagnosis present

## 2016-12-01 DIAGNOSIS — C799 Secondary malignant neoplasm of unspecified site: Secondary | ICD-10-CM | POA: Insufficient documentation

## 2016-12-01 LAB — GLUCOSE, CAPILLARY: Glucose-Capillary: 107 mg/dL — ABNORMAL HIGH (ref 65–99)

## 2016-12-01 MED ORDER — FLUDEOXYGLUCOSE F - 18 (FDG) INJECTION
10.6000 | Freq: Once | INTRAVENOUS | Status: AC | PRN
  Administered 2016-12-01: 10.6 via INTRAVENOUS

## 2016-12-01 NOTE — Telephone Encounter (Signed)
lvm to inform pt of added lab prior to chem class at 0900 6/26 and chem start 6/27 at 0800 per chrg RN and sch msg

## 2016-12-01 NOTE — Progress Notes (Signed)
Per Lattie Haw, NP notify pathology of the need for foundation 1, and Her 2 stains on rectal and lymph node biopsies. Spoke to [employer] in pathology, and understanding of order verbalized.

## 2016-12-02 ENCOUNTER — Other Ambulatory Visit: Payer: Self-pay | Admitting: Radiology

## 2016-12-03 ENCOUNTER — Other Ambulatory Visit: Payer: Self-pay | Admitting: Radiology

## 2016-12-03 ENCOUNTER — Other Ambulatory Visit: Payer: Self-pay | Admitting: Oncology

## 2016-12-04 ENCOUNTER — Encounter: Payer: Self-pay | Admitting: *Deleted

## 2016-12-04 ENCOUNTER — Telehealth: Payer: Self-pay

## 2016-12-04 ENCOUNTER — Encounter (HOSPITAL_BASED_OUTPATIENT_CLINIC_OR_DEPARTMENT_OTHER): Payer: Self-pay | Admitting: *Deleted

## 2016-12-04 NOTE — Progress Notes (Signed)
NPO AFTER MN.  ARRIVE AT 0730.  PT WILL HAVE CURRENT LAB WORK FOR DOS SINCE HE IS  GETTING LAB WORK DONE 12-07-2016 FOR PORT-A-CATH PLACEMENT.

## 2016-12-04 NOTE — Telephone Encounter (Signed)
Pt called for PET result. PET done 6/22. Report read to pt. Pt said" so the cancer is localized to my rectal area".  Does pt need bladder biopsy that is currently scheduled at [facility] on July 2?

## 2016-12-05 ENCOUNTER — Encounter (HOSPITAL_COMMUNITY): Payer: Self-pay

## 2016-12-05 ENCOUNTER — Other Ambulatory Visit: Payer: Self-pay | Admitting: Nurse Practitioner

## 2016-12-05 ENCOUNTER — Ambulatory Visit (HOSPITAL_COMMUNITY)
Admission: RE | Admit: 2016-12-05 | Discharge: 2016-12-05 | Disposition: A | Discharge status: Home / Self Care | Payer: Federal, State, Local not specified - PPO | Source: Ambulatory Visit | Attending: Nurse Practitioner | Admitting: Nurse Practitioner

## 2016-12-05 ENCOUNTER — Encounter: Payer: Self-pay | Admitting: *Deleted

## 2016-12-05 ENCOUNTER — Other Ambulatory Visit (HOSPITAL_BASED_OUTPATIENT_CLINIC_OR_DEPARTMENT_OTHER): Payer: Federal, State, Local not specified - PPO

## 2016-12-05 ENCOUNTER — Other Ambulatory Visit: Payer: Federal, State, Local not specified - PPO

## 2016-12-05 ENCOUNTER — Encounter: Payer: Self-pay | Admitting: Pharmacist

## 2016-12-05 DIAGNOSIS — C20 Malignant neoplasm of rectum: Secondary | ICD-10-CM | POA: Diagnosis present

## 2016-12-05 DIAGNOSIS — C772 Secondary and unspecified malignant neoplasm of intra-abdominal lymph nodes: Secondary | ICD-10-CM | POA: Insufficient documentation

## 2016-12-05 DIAGNOSIS — C801 Malignant (primary) neoplasm, unspecified: Secondary | ICD-10-CM

## 2016-12-05 DIAGNOSIS — K5909 Other constipation: Secondary | ICD-10-CM | POA: Insufficient documentation

## 2016-12-05 DIAGNOSIS — Z7982 Long term (current) use of aspirin: Secondary | ICD-10-CM | POA: Insufficient documentation

## 2016-12-05 DIAGNOSIS — C786 Secondary malignant neoplasm of retroperitoneum and peritoneum: Secondary | ICD-10-CM

## 2016-12-05 HISTORY — PX: IR US GUIDE VASC ACCESS RIGHT: IMG2390

## 2016-12-05 HISTORY — PX: IR FLUORO GUIDE PORT INSERTION RIGHT: IMG5741

## 2016-12-05 LAB — COMPREHENSIVE METABOLIC PANEL
ALBUMIN: 3.3 g/dL — AB (ref 3.5–5.0)
ALK PHOS: 68 U/L (ref 40–150)
ALT: 11 U/L (ref 0–55)
ANION GAP: 7 meq/L (ref 3–11)
AST: 16 U/L (ref 5–34)
BILIRUBIN TOTAL: 0.39 mg/dL (ref 0.20–1.20)
BUN: 17.3 mg/dL (ref 7.0–26.0)
CO2: 28 meq/L (ref 22–29)
CREATININE: 1.4 mg/dL — AB (ref 0.7–1.3)
Calcium: 10.4 mg/dL (ref 8.4–10.4)
Chloride: 108 mEq/L (ref 98–109)
EGFR: 52 mL/min/{1.73_m2} — AB (ref >90)
GLUCOSE: 104 mg/dL (ref 70–140)
Potassium: 4.1 mEq/L (ref 3.5–5.1)
SODIUM: 144 meq/L (ref 136–145)
TOTAL PROTEIN: 6.4 g/dL (ref 6.4–8.3)

## 2016-12-05 LAB — PROTIME-INR
INR: 1.07
PROTHROMBIN TIME: 14 s (ref 11.4–15.2)

## 2016-12-05 LAB — CBC WITH DIFFERENTIAL/PLATELET
BASO%: 0.5 % (ref 0.0–2.0)
Basophils Absolute: 0 10*3/uL (ref 0.0–0.1)
EOS ABS: 0.3 10*3/uL (ref 0.0–0.5)
EOS%: 5.6 % (ref 0.0–7.0)
HCT: 34.6 % — ABNORMAL LOW (ref 38.4–49.9)
HEMOGLOBIN: 11.4 g/dL — AB (ref 13.0–17.1)
LYMPH%: 18.7 % (ref 14.0–49.0)
MCH: 29.2 pg (ref 27.2–33.4)
MCHC: 32.9 g/dL (ref 32.0–36.0)
MCV: 88.5 fL (ref 79.3–98.0)
MONO#: 0.3 10*3/uL (ref 0.1–0.9)
MONO%: 6.2 % (ref 0.0–14.0)
NEUT%: 69 % (ref 39.0–75.0)
NEUTROS ABS: 3.8 10*3/uL (ref 1.5–6.5)
PLATELETS: 159 10*3/uL (ref 140–400)
RBC: 3.91 10*6/uL — ABNORMAL LOW (ref 4.20–5.82)
RDW: 13 % (ref 11.0–14.6)
WBC: 5.5 10*3/uL (ref 4.0–10.3)
lymph#: 1 10*3/uL (ref 0.9–3.3)

## 2016-12-05 LAB — APTT: aPTT: 28 seconds (ref 24–36)

## 2016-12-05 LAB — CEA (IN HOUSE-CHCC): CEA (CHCC-In House): 699.35 ng/mL — ABNORMAL HIGH (ref 0.00–5.00)

## 2016-12-05 MED ORDER — CEFAZOLIN SODIUM-DEXTROSE 2-4 GM/100ML-% IV SOLN
2.0000 g | INTRAVENOUS | Status: AC
  Administered 2016-12-05: 2 g via INTRAVENOUS

## 2016-12-05 MED ORDER — LIDOCAINE HCL 1 % IJ SOLN
INTRAMUSCULAR | Status: AC | PRN
  Administered 2016-12-05: 15 mL

## 2016-12-05 MED ORDER — SODIUM CHLORIDE 0.9 % IV SOLN
INTRAVENOUS | Status: DC
  Administered 2016-12-05: 12:00:00 via INTRAVENOUS

## 2016-12-05 MED ORDER — FENTANYL CITRATE (PF) 100 MCG/2ML IJ SOLN
INTRAMUSCULAR | Status: AC | PRN
  Administered 2016-12-05: 25 ug via INTRAVENOUS
  Administered 2016-12-05: 50 ug via INTRAVENOUS
  Administered 2016-12-05: 25 ug via INTRAVENOUS

## 2016-12-05 MED ORDER — FENTANYL CITRATE (PF) 100 MCG/2ML IJ SOLN
INTRAMUSCULAR | Status: AC
  Filled 2016-12-05: qty 6

## 2016-12-05 MED ORDER — MIDAZOLAM HCL 2 MG/2ML IJ SOLN
INTRAMUSCULAR | Status: AC | PRN
  Administered 2016-12-05 (×3): 1 mg via INTRAVENOUS

## 2016-12-05 MED ORDER — HEPARIN SOD (PORK) LOCK FLUSH 100 UNIT/ML IV SOLN
INTRAVENOUS | Status: AC
  Filled 2016-12-05: qty 5

## 2016-12-05 MED ORDER — MIDAZOLAM HCL 2 MG/2ML IJ SOLN
INTRAMUSCULAR | Status: AC
  Filled 2016-12-05: qty 6

## 2016-12-05 MED ORDER — LIDOCAINE-EPINEPHRINE (PF) 2 %-1:200000 IJ SOLN
INTRAMUSCULAR | Status: AC
  Filled 2016-12-05: qty 20

## 2016-12-05 MED ORDER — CEFAZOLIN SODIUM-DEXTROSE 2-4 GM/100ML-% IV SOLN
INTRAVENOUS | Status: AC
  Filled 2016-12-05: qty 100

## 2016-12-05 NOTE — Procedures (Signed)
   R IJ Port cathter placement with US and fluoroscopy No complication No blood loss. See complete dictation in [facility] PACS.  D. Shaday Rayborn MD Main # [phone] Pager  [phone]      

## 2016-12-05 NOTE — H&P (Signed)
Referring Physician(s): Sherrill,B  Supervising Physician: Arne Cleveland  Patient Status:  [facility]  Chief Complaint:  "I'm getting a port a cath"  Subjective: Patient familiar to IR service from prior left retroperitoneal lymph node biopsy on 11/10/16. He has a history of recently diagnosed metastatic rectal cancer and presents today for Port-A-Cath placement for chemotherapy. He currently denies fever, headache, chest pain, dyspnea, cough, abdominal/back pain, nausea, vomiting or bleeding. Past Medical History:  Diagnosis Date  . Allergic rhinitis, seasonal   . Anemia   . Bilateral hydrocele   . Bilateral hydronephrosis   . Chronic constipation   . Edema of right lower extremity   . 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 Wears glasses    Past Surgical History:  Procedure Laterality Date  . COLONOSCOPY WITH ESOPHAGOGASTRODUODENOSCOPY (EGD)  11-27-2016  dr Earlean Shawl   rectal mass  . UMBILICAL HERNIA REPAIR  05/2016      Allergies: Patient has no known allergies.  Medications: Prior to Admission medications   Medication Sig Start Date End Date Taking? Authorizing Provider  aspirin 325 MG tablet Take 325 mg by mouth every 3 (three) days.    Yes [provider]  glucosamine-chondroitin 500-400 MG tablet Take 1 tablet by mouth every 3 (three) days.    Yes [provider]  LYSINE PO Take by mouth as needed. TAKES FOR FEVER BLISTERS   Yes [provider]  Multiple Vitamin (MULTIVITAMIN) tablet Take 1 tablet by mouth every 3 (three) days.    Yes [provider]  Omega-3 Fatty Acids (FISH OIL) 1200 MG CAPS Take by mouth every 3 (three) days.    Yes [provider]  Polyethylene Glycol 3350 (MIRALAX PO) Take by mouth daily.   Yes [provider]  saw palmetto 80 MG capsule Take 80 mg by mouth every 3 (three)  days.    Yes [provider]  lidocaine-prilocaine (EMLA) cream APPLY TO PORT SITE 1 HOUR PRIOR TO USE 11/30/16   [name], NP  prochlorperazine (COMPAZINE) 10 MG tablet Take 1 tablet (10 mg total) by mouth every 6 (six) hours as needed for nausea or vomiting. 11/30/16   [name], NP     Vital Signs: BP 134/81 (BP Location: Left Arm)   Pulse 69   Temp 97.4 F (36.3 C) (Oral)   Resp 18   SpO2 98%   Physical Exam awake, alert. Chest clear to auscultation bilaterally. Heart with regular rate and rhythm. Abdomen soft, positive bowel sounds, nontender. 1-2+ right lower extremity edema, no left lower extremity edema  Imaging: Nm Pet Image Initial (pi) Skull Base To Thigh  Result Date: 12/01/2016 CLINICAL DATA:  Initial treatment strategy for retroperitoneal lymphadenopathy. Adenocarcinoma. Rectal adenocarcinoma with retroperitoneal nodal metastasis. Chemotherapy initiated. EXAM: NUCLEAR MEDICINE PET SKULL BASE TO THIGH TECHNIQUE: 10.6 mCi F-18 FDG was injected intravenously. Full-ring PET imaging was performed from the skull base to thigh after the radiotracer. CT data was obtained and used for attenuation correction and anatomic localization. FASTING BLOOD GLUCOSE:  Value: 107 mg/dl COMPARISON:  None. FINDINGS: NECK No hypermetabolic lymph nodes in the neck. CHEST No hypermetabolic mediastinal or hilar nodes. No suspicious pulmonary nodules on the CT scan. ABDOMEN/PELVIS Hazy fat stranding in the periaortic retroperitoneum similar to prior. Multiple small lymph nodes are present. One larger lymph node measures 11 mm short axis (image 139, series 4)  compared 12 mm. These retroperitoneal lymph node has minimal hypermetabolic activity. For example above-described 11 mm lymph node has SUV max equal 2 .9. There is diffuse thickening of the distal rectum with minimal hypermetabolic activity for the amount of thickening. There is some activity most distal rectum at the level anal verge with  SUV max equal 4.6. No abnormal metabolic activity the liver. The no abnormal activity adrenal glands. No iliac or inguinal hypermetabolic adenopathy. SKELETON No focal hypermetabolic activity to suggest skeletal metastasis. IMPRESSION: 1. Minimal activity in the primary rectal carcinoma suggest positive response to initial chemotherapy. 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 Diffuse wall thickening remains. 2. Mild activity associated retroperitoneal lymph nodes. Persistent haziness within the retroperitoneum. No evidence of progression. 3. No evidence of liver metastasis or distant metastasis. Electronically Signed   By: Suzy Bouchard M.D.   On: 12/01/2016 15:02    Labs:  CBC:  Recent Labs  11/01/16 0949 11/10/16 0713 11/23/16 1033 12/05/16 0912  WBC 5.9 6.2 5.6 5.5  HGB 12.7* 13.0 12.0* 11.4*  HCT 37.9* 38.5* 35.9* 34.6*  PLT 180.0 179 199 159    COAGS:  Recent Labs  11/10/16 0713  INR 0.98  APTT 29    BMP:  Recent Labs  11/01/16 0949 11/23/16 1033 12/05/16 0912  NA 142 142 144  K 4.8 4.2 4.1  CL 107  --   --   CO2 29 28 28   GLUCOSE 105* 98 104  BUN 16 15.7 17.3  CALCIUM 10.4 10.4 10.4  CREATININE 1.16 1.3 1.4*    LIVER FUNCTION TESTS:  Recent Labs  11/01/16 0949 11/23/16 1033 12/05/16 0912  BILITOT 0.4 0.47 0.39  AST 16 16 16   ALT 13 9 11   ALKPHOS 68 69 68  PROT 6.4 6.6 6.4  ALBUMIN 3.9 3.4* 3.3*    Assessment and Plan: Pt with history of recently diagnosed metastatic rectal cancer ; presents today for Port-A-Cath placement for chemotherapy. Risks and benefits discussed with the patient /wife including, but not limited to bleeding, infection, pneumothorax, or fibrin sheath development and need for additional procedures.All of the patient's questions were answered, patient is agreeable to proceed.Consent signed and in chart.     Electronically Signed: D. Rowe Robert, PA-C 12/05/2016, 12:16 PM   I spent a total of 20  minutes at the the patient's bedside AND on the patient's hospital floor or unit, greater than 50% of which was counseling/coordinating care for Port-A-Cath placement

## 2016-12-05 NOTE — Discharge Instructions (Signed)
Moderate Conscious Sedation, Adult, Care After °These instructions provide you with information about caring for yourself after your procedure. Your health care provider may also give you more specific instructions. Your treatment has been planned according to current medical practices, but problems sometimes occur. Call your health care provider if you have any problems or questions after your procedure. °What can I expect after the procedure? °After your procedure, it is common: °· To feel sleepy for several hours. °· To feel clumsy and have poor balance for several hours. °· To have poor judgment for several hours. °· To vomit if you eat too soon. ° °Follow these instructions at home: °For at least 24 hours after the procedure: ° °· Do not: °? Participate in activities where you could fall or become injured. °? Drive. °? Use heavy machinery. °? Drink alcohol. °? Take sleeping pills or medicines that cause drowsiness. °? Make important decisions or sign legal documents. °? Take care of children on your own. °· Rest. °Eating and drinking °· Follow the diet recommended by your health care provider. °· If you vomit: °? Drink water, juice, or soup when you can drink without vomiting. °? Make sure you have little or no nausea before eating solid foods. °General instructions °· Have a responsible adult stay with you until you are awake and alert. °· Take over-the-counter and prescription medicines only as told by your health care provider. °· If you smoke, do not smoke without supervision. °· Keep all follow-up visits as told by your health care provider. This is important. °Contact a health care provider if: °· You keep feeling nauseous or you keep vomiting. °· You feel light-headed. °· You develop a rash. °· You have a fever. °Get help right away if: °· You have trouble breathing. °This information is not intended to replace advice given to you by your health care provider. Make sure you discuss any questions you have  with your health care provider. °Document Released: 03/19/2013 Document Revised: 11/01/2015 Document Reviewed: 09/18/2015 °[employer]vier Interactive Patient Education © 2018 [employer] °Implanted Port Insertion, Care After °This sheet gives you information about how to care for yourself after your procedure. Your health care provider may also give you more specific instructions. If you have problems or questions, contact your health care provider. °What can I expect after the procedure? °After your procedure, it is common to have: °· Discomfort at the port insertion site. °· Bruising on the skin over the port. This should improve over 3-4 days. ° °Follow these instructions at home: °Port care °· After your port is placed, you will get a manufacturer's information card. The card has information about your port. Keep this card with you at all times. °· Take care of the port as told by your health care provider. Ask your health care provider if you or a family member can get training for taking care of the port at home. A home health care nurse may also take care of the port. °· Make sure to remember what type of port you have. °Incision care °· Follow instructions from your health care provider about how to take care of your port insertion site. Make sure you: °? Wash your hands with soap and water before you change your bandage (dressing). If soap and water are not available, use hand sanitizer. °? Change your dressing as told by your health care provider. °? Leave stitches (sutures), skin glue, or adhesive strips in place. These skin closures may need to stay   in place for 2 weeks or longer. If adhesive strip edges start to loosen and curl up, you may trim the loose edges. Do not remove adhesive strips completely unless your health care provider tells you to do that. °· Check your port insertion site every day for signs of infection. Check for: °? More redness, swelling, or pain. °? More fluid or  blood. °? Warmth. °? Pus or a bad smell. °General instructions °· Do not take baths, swim, or use a hot tub until your health care provider approves. °· Do not lift anything that is heavier than 10 lb (4.5 kg) for a week, or as told by your health care provider. °· Ask your health care provider when it is okay to: °? Return to work or school. °? Resume usual physical activities or sports. °· Do not drive for 24 hours if you were given a medicine to help you relax (sedative). °· Take over-the-counter and prescription medicines only as told by your health care provider. °· Wear a medical alert bracelet in case of an emergency. This will tell any health care providers that you have a port. °· Keep all follow-up visits as told by your health care provider. This is important. °Contact a health care provider if: °· You cannot flush your port with saline as directed, or you cannot draw blood from the port. °· You have a fever or chills. °· You have more redness, swelling, or pain around your port insertion site. °· You have more fluid or blood coming from your port insertion site. °· Your port insertion site feels warm to the touch. °· You have pus or a bad smell coming from the port insertion site. °Get help right away if: °· You have chest pain or shortness of breath. °· You have bleeding from your port that you cannot control. °Summary °· Take care of the port as told by your health care provider. °· Change your dressing as told by your health care provider. °· Keep all follow-up visits as told by your health care provider. °This information is not intended to replace advice given to you by your health care provider. Make sure you discuss any questions you have with your health care provider. °Document Released: 03/19/2013 Document Revised: 04/19/2016 Document Reviewed: 04/19/2016 °Elsevier Interactive Patient Education © 2017 [employer] ° °

## 2016-12-06 ENCOUNTER — Other Ambulatory Visit: Payer: Self-pay | Admitting: Nurse Practitioner

## 2016-12-06 ENCOUNTER — Ambulatory Visit (HOSPITAL_BASED_OUTPATIENT_CLINIC_OR_DEPARTMENT_OTHER): Payer: Federal, State, Local not specified - PPO

## 2016-12-06 VITALS — BP 126/78 | HR 80 | Temp 98.2°F | Resp 17

## 2016-12-06 DIAGNOSIS — C20 Malignant neoplasm of rectum: Secondary | ICD-10-CM | POA: Diagnosis not present

## 2016-12-06 DIAGNOSIS — C786 Secondary malignant neoplasm of retroperitoneum and peritoneum: Secondary | ICD-10-CM | POA: Diagnosis not present

## 2016-12-06 DIAGNOSIS — Z5111 Encounter for antineoplastic chemotherapy: Secondary | ICD-10-CM | POA: Diagnosis not present

## 2016-12-06 MED ORDER — PALONOSETRON HCL INJECTION 0.25 MG/5ML
INTRAVENOUS | Status: AC
  Filled 2016-12-06: qty 5

## 2016-12-06 MED ORDER — OXALIPLATIN CHEMO INJECTION 100 MG/20ML
89.0000 mg/m2 | Freq: Once | INTRAVENOUS | Status: AC
  Administered 2016-12-06: 200 mg via INTRAVENOUS
  Filled 2016-12-06: qty 40

## 2016-12-06 MED ORDER — DEXAMETHASONE SODIUM PHOSPHATE 10 MG/ML IJ SOLN
10.0000 mg | Freq: Once | INTRAMUSCULAR | Status: AC
  Administered 2016-12-06: 10 mg via INTRAVENOUS

## 2016-12-06 MED ORDER — DEXTROSE 5 % IV SOLN
Freq: Once | INTRAVENOUS | Status: AC
  Administered 2016-12-06: 08:00:00 via INTRAVENOUS

## 2016-12-06 MED ORDER — PALONOSETRON HCL INJECTION 0.25 MG/5ML
0.2500 mg | Freq: Once | INTRAVENOUS | Status: AC
  Administered 2016-12-06: 0.25 mg via INTRAVENOUS

## 2016-12-06 MED ORDER — SODIUM CHLORIDE 0.9% FLUSH
10.0000 mL | INTRAVENOUS | Status: DC | PRN
  Filled 2016-12-06: qty 10

## 2016-12-06 MED ORDER — SODIUM CHLORIDE 0.9 % IV SOLN
2400.0000 mg/m2 | INTRAVENOUS | Status: DC
  Administered 2016-12-06: 5350 mg via INTRAVENOUS
  Filled 2016-12-06: qty 107

## 2016-12-06 MED ORDER — DEXAMETHASONE SODIUM PHOSPHATE 10 MG/ML IJ SOLN
INTRAMUSCULAR | Status: AC
  Filled 2016-12-06: qty 1

## 2016-12-06 MED ORDER — HEPARIN SOD (PORK) LOCK FLUSH 100 UNIT/ML IV SOLN
500.0000 [IU] | Freq: Once | INTRAVENOUS | Status: DC | PRN
  Filled 2016-12-06: qty 5

## 2016-12-06 MED ORDER — FLUOROURACIL CHEMO INJECTION 2.5 GM/50ML
400.0000 mg/m2 | Freq: Once | INTRAVENOUS | Status: AC
  Administered 2016-12-06: 900 mg via INTRAVENOUS
  Filled 2016-12-06: qty 18

## 2016-12-06 MED ORDER — LEUCOVORIN CALCIUM INJECTION 350 MG
400.0000 mg/m2 | Freq: Once | INTRAMUSCULAR | Status: AC
  Administered 2016-12-06: 892 mg via INTRAVENOUS
  Filled 2016-12-06: qty 44.6

## 2016-12-06 NOTE — Patient Instructions (Addendum)
[facility] Discharge Instructions for Patients Receiving Chemotherapy  Today you received the following chemotherapy agents:  Oxaliplatin, Leucovorin, and 5FU.  To help prevent nausea and vomiting after your treatment, we encourage you to take your nausea medication as directed.   If you develop nausea and vomiting that is not controlled by your nausea medication, call the clinic.   BELOW ARE SYMPTOMS THAT SHOULD BE REPORTED IMMEDIATELY:  *FEVER GREATER THAN 100.5 F  *CHILLS WITH OR WITHOUT FEVER  NAUSEA AND VOMITING THAT IS NOT CONTROLLED WITH YOUR NAUSEA MEDICATION  *UNUSUAL SHORTNESS OF BREATH  *UNUSUAL BRUISING OR BLEEDING  TENDERNESS IN MOUTH AND THROAT WITH OR WITHOUT PRESENCE OF ULCERS  *URINARY PROBLEMS  *BOWEL PROBLEMS  UNUSUAL RASH Items with * indicate a potential emergency and should be followed up as soon as possible.  Feel free to call the clinic you have any questions or concerns. The clinic phone number is [phone].  Please show the [place] at check-in to the Emergency Department and triage nurse.  Oxaliplatin Injection What is this medicine? OXALIPLATIN (ox AL i PLA tin) is a chemotherapy drug. It targets fast dividing cells, like cancer cells, and causes these cells to die. This medicine is used to treat cancers of the colon and rectum, and many other cancers. This medicine may be used for other purposes; ask your health care provider or pharmacist if you have questions. COMMON BRAND NAME(S): Eloxatin What should I tell my health care provider before I take this medicine? They need to know if you have any of these conditions: -kidney disease -an unusual or allergic reaction to oxaliplatin, other chemotherapy, other medicines, foods, dyes, or preservatives -pregnant or trying to get pregnant -breast-feeding How should I use this medicine? This drug is given as an infusion into a vein. It is administered in a hospital or  clinic by a specially trained health care professional. Talk to your pediatrician regarding the use of this medicine in children. Special care may be needed. Overdosage: If you think you have taken too much of this medicine contact a poison control center or emergency room at once. NOTE: This medicine is only for you. Do not share this medicine with others. What if I miss a dose? It is important not to miss a dose. Call your doctor or health care professional if you are unable to keep an appointment. What may interact with this medicine? -medicines to increase blood counts like filgrastim, pegfilgrastim, sargramostim -probenecid -some antibiotics like amikacin, gentamicin, neomycin, polymyxin B, streptomycin, tobramycin -zalcitabine Talk to your doctor or health care professional before taking any of these medicines: -acetaminophen -aspirin -ibuprofen -ketoprofen -naproxen This list may not describe all possible interactions. Give your health care provider a list of all the medicines, herbs, non-prescription drugs, or dietary supplements you use. Also tell them if you smoke, drink alcohol, or use illegal drugs. Some items may interact with your medicine. What should I watch for while using this medicine? Your condition will be monitored carefully while you are receiving this medicine. You will need important blood work done while you are taking this medicine. This medicine can make you more sensitive to cold. Do not drink cold drinks or use ice. Cover exposed skin before coming in contact with cold temperatures or cold objects. When out in cold weather wear warm clothing and cover your mouth and nose to warm the air that goes into your lungs. Tell your doctor if you get sensitive to the cold.  This drug may make you feel generally unwell. This is not uncommon, as chemotherapy can affect healthy cells as well as cancer cells. Report any side effects. Continue your course of treatment even though  you feel ill unless your doctor tells you to stop. In some cases, you may be given additional medicines to help with side effects. Follow all directions for their use. Call your doctor or health care professional for advice if you get a fever, chills or sore throat, or other symptoms of a cold or flu. Do not treat yourself. This drug decreases your body's ability to fight infections. Try to avoid being around people who are sick. This medicine may increase your risk to bruise or bleed. Call your doctor or health care professional if you notice any unusual bleeding. Be careful brushing and flossing your teeth or using a toothpick because you may get an infection or bleed more easily. If you have any dental work done, tell your dentist you are receiving this medicine. Avoid taking products that contain aspirin, acetaminophen, ibuprofen, naproxen, or ketoprofen unless instructed by your doctor. These medicines may hide a fever. Do not become pregnant while taking this medicine. Women should inform their doctor if they wish to become pregnant or think they might be pregnant. There is a potential for serious side effects to an unborn child. Talk to your health care professional or pharmacist for more information. Do not breast-feed an infant while taking this medicine. Call your doctor or health care professional if you get diarrhea. Do not treat yourself. What side effects may I notice from receiving this medicine? Side effects that you should report to your doctor or health care professional as soon as possible: -allergic reactions like skin rash, itching or hives, swelling of the face, lips, or tongue -low blood counts - This drug may decrease the number of white blood cells, red blood cells and platelets. You may be at increased risk for infections and bleeding. -signs of infection - fever or chills, cough, sore throat, pain or difficulty passing urine -signs of decreased platelets or bleeding -  bruising, pinpoint red spots on the skin, black, tarry stools, nosebleeds -signs of decreased red blood cells - unusually weak or tired, fainting spells, lightheadedness -breathing problems -chest pain, pressure -cough -diarrhea -jaw tightness -mouth sores -nausea and vomiting -pain, swelling, redness or irritation at the injection site -pain, tingling, numbness in the hands or feet -problems with balance, talking, walking -redness, blistering, peeling or loosening of the skin, including inside the mouth -trouble passing urine or change in the amount of urine Side effects that usually do not require medical attention (report to your doctor or health care professional if they continue or are bothersome): -changes in vision -constipation -hair loss -loss of appetite -metallic taste in the mouth or changes in taste -stomach pain This list may not describe all possible side effects. Call your doctor for medical advice about side effects. You may report side effects to FDA at 1-800-FDA-1088. Where should I keep my medicine? This drug is given in a hospital or clinic and will not be stored at home. NOTE: This sheet is a summary. Shawn[facility] may not cover all possible information. If you have questions about this medicine, talk to your doctor, pharmacist, or health care provider.  2018 Elsevier/Gold Standard (2007-12-24 17:22:47)  Leucovorin injection What is this medicine? LEUCOVORIN (loo koe VOR in) is used to prevent or treat the harmful effects of some medicines. This medicine is used to treat  anemia caused by a low amount of folic acid in the body. It is also used with 5-fluorouracil (5-FU) to treat colon cancer. This medicine may be used for other purposes; ask your health care provider or pharmacist if you have questions. What should I tell my health care provider before I take this medicine? They need to know if you have any of these conditions: -anemia from low levels of vitamin B-12 in  the blood -an unusual or allergic reaction to leucovorin, folic acid, other medicines, foods, dyes, or preservatives -pregnant or trying to get pregnant -breast-feeding How should I use this medicine? This medicine is for injection into a muscle or into a vein. It is given by a health care professional in a hospital or clinic setting. Talk to your pediatrician regarding the use of this medicine in children. Special care may be needed. Overdosage: If you think you have taken too much of this medicine contact a poison control center or emergency room at once. NOTE: This medicine is only for you. Do not share this medicine with others. What if I miss a dose? This does not apply. What may interact with this medicine? -capecitabine -fluorouracil -phenobarbital -phenytoin -primidone -trimethoprim-sulfamethoxazole This list may not describe all possible interactions. Give your health care provider a list of all the medicines, herbs, non-prescription drugs, or dietary supplements you use. Also tell them if you smoke, drink alcohol, or use illegal drugs. Some items may interact with your medicine. What should I watch for while using this medicine? Your condition will be monitored carefully while you are receiving this medicine. This medicine may increase the side effects of 5-fluorouracil, 5-FU. Tell your doctor or health care professional if you have diarrhea or mouth sores that do not get better or that get worse. What side effects may I notice from receiving this medicine? Side effects that you should report to your doctor or health care professional as soon as possible: -allergic reactions like skin rash, itching or hives, swelling of the face, lips, or tongue -breathing problems -fever, infection -mouth sores -unusual bleeding or bruising -unusually weak or tired Side effects that usually do not require medical attention (report to your doctor or health care professional if they continue or  are bothersome): -constipation or diarrhea -loss of appetite -nausea, vomiting This list may not describe all possible side effects. Call your doctor for medical advice about side effects. You may report side effects to FDA at 1-800-FDA-1088. Where should I keep my medicine? This drug is given in a hospital or clinic and will not be stored at home. NOTE: This sheet is a summary. It may not cover all possible information. If you have questions about this medicine, talk to your doctor, pharmacist, or health care provider.  2018 Elsevier/Gold Standard (2007-12-03 16:50:29)  Fluorouracil, 5-FU injection What is this medicine? FLUOROURACIL, 5-FU (flure oh YOOR a sil) is a chemotherapy drug. It slows the growth of cancer cells. This medicine is used to treat many types of cancer like breast cancer, colon or rectal cancer, pancreatic cancer, and stomach cancer. This medicine may be used for other purposes; ask your health care provider or pharmacist if you have questions. COMMON BRAND NAME(S): Adrucil What should I tell my health care provider before I take this medicine? They need to know if you have any of these conditions: -blood disorders -dihydropyrimidine dehydrogenase (DPD) deficiency -infection (especially a virus infection such as chickenpox, cold sores, or herpes) -kidney disease -liver disease -malnourished, poor nutrition -recent or   ongoing radiation therapy -an unusual or allergic reaction to fluorouracil, other chemotherapy, other medicines, foods, dyes, or preservatives -pregnant or trying to get pregnant -breast-feeding How should I use this medicine? This drug is given as an infusion or injection into a vein. It is administered in a hospital or clinic by a specially trained health care professional. Talk to your pediatrician regarding the use of this medicine in children. Special care may be needed. Overdosage: If you think you have taken too much of this medicine contact a  poison control center or emergency room at once. NOTE: This medicine is only for you. Do not share this medicine with others. What if I miss a dose? It is important not to miss your dose. Call your doctor or health care professional if you are unable to keep an appointment. What may interact with this medicine? -allopurinol -cimetidine -dapsone -digoxin -hydroxyurea -leucovorin -levamisole -medicines for seizures like ethotoin, fosphenytoin, phenytoin -medicines to increase blood counts like filgrastim, pegfilgrastim, sargramostim -medicines that treat or prevent blood clots like warfarin, enoxaparin, and dalteparin -methotrexate -metronidazole -pyrimethamine -some other chemotherapy drugs like busulfan, cisplatin, estramustine, vinblastine -trimethoprim -trimetrexate -vaccines Talk to your doctor or health care professional before taking any of these medicines: -acetaminophen -aspirin -ibuprofen -ketoprofen -naproxen This list may not describe all possible interactions. Give your health care provider a list of all the medicines, herbs, non-prescription drugs, or dietary supplements you use. Also tell them if you smoke, drink alcohol, or use illegal drugs. Some items may interact with your medicine. What should I watch for while using this medicine? Visit your doctor for checks on your progress. This drug may make you feel generally unwell. This is not uncommon, as chemotherapy can affect healthy cells as well as cancer cells. Report any side effects. Continue your course of treatment even though you feel ill unless your doctor tells you to stop. In some cases, you may be given additional medicines to help with side effects. Follow all directions for their use. Call your doctor or health care professional for advice if you get a fever, chills or sore throat, or other symptoms of a cold or flu. Do not treat yourself. This drug decreases your body's ability to fight infections. Try to  avoid being around people who are sick. This medicine may increase your risk to bruise or bleed. Call your doctor or health care professional if you notice any unusual bleeding. Be careful brushing and flossing your teeth or using a toothpick because you may get an infection or bleed more easily. If you have any dental work done, tell your dentist you are receiving this medicine. Avoid taking products that contain aspirin, acetaminophen, ibuprofen, naproxen, or ketoprofen unless instructed by your doctor. These medicines may hide a fever. Do not become pregnant while taking this medicine. Women should inform their doctor if they wish to become pregnant or think they might be pregnant. There is a potential for serious side effects to an unborn child. Talk to your health care professional or pharmacist for more information. Do not breast-feed an infant while taking this medicine. Men should inform their doctor if they wish to father a child. This medicine may lower sperm counts. Do not treat diarrhea with over the counter products. Contact your doctor if you have diarrhea that lasts more than 2 days or if it is severe and watery. This medicine can make you more sensitive to the sun. Keep out of the sun. If you cannot avoid being in the sun,   wear protective clothing and use sunscreen. Do not use sun lamps or tanning beds/booths. What side effects may I notice from receiving this medicine? Side effects that you should report to your doctor or health care professional as soon as possible: -allergic reactions like skin rash, itching or hives, swelling of the face, lips, or tongue -low blood counts - this medicine may decrease the number of white blood cells, red blood cells and platelets. You may be at increased risk for infections and bleeding. -signs of infection - fever or chills, cough, sore throat, pain or difficulty passing urine -signs of decreased platelets or bleeding - bruising, pinpoint red spots  on the skin, black, tarry stools, blood in the urine -signs of decreased red blood cells - unusually weak or tired, fainting spells, lightheadedness -breathing problems -changes in vision -chest pain -mouth sores -nausea and vomiting -pain, swelling, redness at site where injected -pain, tingling, numbness in the hands or feet -redness, swelling, or sores on hands or feet -stomach pain -unusual bleeding Side effects that usually do not require medical attention (report to your doctor or health care professional if they continue or are bothersome): -changes in finger or toe nails -diarrhea -dry or itchy skin -hair loss -headache -loss of appetite -sensitivity of eyes to the light -stomach upset -unusually teary eyes This list may not describe all possible side effects. Call your doctor for medical advice about side effects. You may report side effects to FDA at 1-800-FDA-1088. Where should I keep my medicine? This drug is given in a hospital or clinic and will not be stored at home. NOTE: This sheet is a summary. It may not cover all possible information. If you have questions about this medicine, talk to your doctor, pharmacist, or health care provider.  2018 Elsevier/Gold Standard (2007-10-02 13:53:16)   Oxaliplatin Injection What is this medicine? OXALIPLATIN (ox AL i PLA tin) is a chemotherapy drug. It targets fast dividing cells, like cancer cells, and causes these cells to die. This medicine is used to treat cancers of the colon and rectum, and many other cancers. This medicine may be used for other purposes; ask your health care provider or pharmacist if you have questions. COMMON BRAND NAME(S): Eloxatin What should I tell my health care provider before I take this medicine? They need to know if you have any of these conditions: -kidney disease -an unusual or allergic reaction to oxaliplatin, other chemotherapy, other medicines, foods, dyes, or preservatives -pregnant or  trying to get pregnant -breast-feeding How should I use this medicine? This drug is given as an infusion into a vein. It is administered in a hospital or clinic by a specially trained health care professional. Talk to your pediatrician regarding the use of this medicine in children. Special care may be needed. Overdosage: If you think you have taken too much of this medicine contact a poison control center or emergency room at once. NOTE: This medicine is only for you. Do not share this medicine with others. What if I miss a dose? It is important not to miss a dose. Call your doctor or health care professional if you are unable to keep an appointment. What may interact with this medicine? -medicines to increase blood counts like filgrastim, pegfilgrastim, sargramostim -probenecid -some antibiotics like amikacin, gentamicin, neomycin, polymyxin B, streptomycin, tobramycin -zalcitabine Talk to your doctor or health care professional before taking any of these medicines: -acetaminophen -aspirin -ibuprofen -ketoprofen -naproxen This list may not describe all possible interactions. Give your health  care provider a list of all the medicines, herbs, non-prescription drugs, or dietary supplements you use. Also tell them if you smoke, drink alcohol, or use illegal drugs. Some items may interact with your medicine. What should I watch for while using this medicine? Your condition will be monitored carefully while you are receiving this medicine. You will need important blood work done while you are taking this medicine. This medicine can make you more sensitive to cold. Do not drink cold drinks or use ice. Cover exposed skin before coming in contact with cold temperatures or cold objects. When out in cold weather wear warm clothing and cover your mouth and nose to warm the air that goes into your lungs. Tell your doctor if you get sensitive to the cold. This drug may make you feel generally unwell.  This is not uncommon, as chemotherapy can affect healthy cells as well as cancer cells. Report any side effects. Continue your course of treatment even though you feel ill unless your doctor tells you to stop. In some cases, you may be given additional medicines to help with side effects. Follow all directions for their use. Call your doctor or health care professional for advice if you get a fever, chills or sore throat, or other symptoms of a cold or flu. Do not treat yourself. This drug decreases your body's ability to fight infections. Try to avoid being around people who are sick. This medicine may increase your risk to bruise or bleed. Call your doctor or health care professional if you notice any unusual bleeding. Be careful brushing and flossing your teeth or using a toothpick because you may get an infection or bleed more easily. If you have any dental work done, tell your dentist you are receiving this medicine. Avoid taking products that contain aspirin, acetaminophen, ibuprofen, naproxen, or ketoprofen unless instructed by your doctor. These medicines may hide a fever. Do not become pregnant while taking this medicine. Women should inform their doctor if they wish to become pregnant or think they might be pregnant. There is a potential for serious side effects to an unborn child. Talk to your health care professional or pharmacist for more information. Do not breast-feed an infant while taking this medicine. Call your doctor or health care professional if you get diarrhea. Do not treat yourself. What side effects may I notice from receiving this medicine? Side effects that you should report to your doctor or health care professional as soon as possible: -allergic reactions like skin rash, itching or hives, swelling of the face, lips, or tongue -low blood counts - This drug may decrease the number of white blood cells, red blood cells and platelets. You may be at increased risk for infections  and bleeding. -signs of infection - fever or chills, cough, sore throat, pain or difficulty passing urine -signs of decreased platelets or bleeding - bruising, pinpoint red spots on the skin, black, tarry stools, nosebleeds -signs of decreased red blood cells - unusually weak or tired, fainting spells, lightheadedness -breathing problems -chest pain, pressure -cough -diarrhea -jaw tightness -mouth sores -nausea and vomiting -pain, swelling, redness or irritation at the injection site -pain, tingling, numbness in the hands or feet -problems with balance, talking, walking -redness, blistering, peeling or loosening of the skin, including inside the mouth -trouble passing urine or change in the amount of urine Side effects that usually do not require medical attention (report to your doctor or health care professional if they continue or are bothersome): -changes in  vision -constipation -hair loss -loss of appetite -metallic taste in the mouth or changes in taste -stomach pain This list may not describe all possible side effects. Call your doctor for medical advice about side effects. You may report side effects to FDA at 1-800-FDA-1088. Where should I keep my medicine? This drug is given in a hospital or clinic and will not be stored at home. NOTE: This sheet is a summary. It may not cover all possible information. If you have questions about this medicine, talk to your doctor, pharmacist, or health care provider.  2018 Else[employer]/Gold Standard (2007-12-24 17:22:47)   Fluorouracil, 5-FU injection What is this medicine? FLUOROURACIL, 5-FU (flure oh YOOR a sil) is a chemotherapy drug. It slows the growth of cancer cells. This medicine is used to treat many types of cancer like breast cancer, colon or rectal cancer, pancreatic cancer, and stomach cancer. This medicine may be used for other purposes; ask your health care provider or pharmacist if you have questions. COMMON BRAND NAME(S):  Adrucil What should I tell my health care provider before I take this medicine? They need to know if you have any of these conditions: -blood disorders -dihydropyrimidine dehydrogenase (DPD) deficiency -infection (especially a virus infection such as chickenpox, cold sores, or herpes) -kidney disease -liver disease -malnourished, poor nutrition -recent or ongoing radiation therapy -an unusual or allergic reaction to fluorouracil, other chemotherapy, other medicines, foods, dyes, or preservatives -pregnant or trying to get pregnant -breast-feeding How should I use this medicine? This drug is given as an infusion or injection into a vein. It is administered in a hospital or clinic by a specially trained health care professional. Talk to your pediatrician regarding the use of this medicine in children. Special care may be needed. Overdosage: If you think you have taken too much of this medicine contact a poison control center or emergency room at once. NOTE: This medicine is only for you. Do not share this medicine with others. What if I miss a dose? It is important not to miss your dose. Call your doctor or health care professional if you are unable to keep an appointment. What may interact with this medicine? -allopurinol -cimetidine -dapsone -digoxin -hydroxyurea -leucovorin -levamisole -medicines for seizures like ethotoin, fosphenytoin, phenytoin -medicines to increase blood counts like filgrastim, pegfilgrastim, sargramostim -medicines that treat or prevent blood clots like warfarin, enoxaparin, and dalteparin -methotrexate -metronidazole -pyrimethamine -some other chemotherapy drugs like busulfan, cisplatin, estramustine, vinblastine -trimethoprim -trimetrexate -vaccines Talk to your doctor or health care professional before taking any of these medicines: -acetaminophen -aspirin -ibuprofen -ketoprofen -naproxen This list may not describe all possible interactions. Give  your health care provider a list of all the medicines, herbs, non-prescription drugs, or dietary supplements you use. Also tell them if you smoke, drink alcohol, or use illegal drugs. Some items may interact with your medicine. What should I watch for while using this medicine? Visit your doctor for checks on your progress. This drug may make you feel generally unwell. This is not uncommon, as chemotherapy can affect healthy cells as well as cancer cells. Report any side effects. Continue your course of treatment even though you feel ill unless your doctor tells you to stop. In some cases, you may be given additional medicines to help with side effects. Follow all directions for their use. Call your doctor or health care professional for advice if you get a fever, chills or sore throat, or other symptoms of a cold or flu. Do not treat yourself. This  drug decreases your body's ability to fight infections. Try to avoid being around people who are sick. This medicine may increase your risk to bruise or bleed. Call your doctor or health care professional if you notice any unusual bleeding. Be careful brushing and flossing your teeth or using a toothpick because you may get an infection or bleed more easily. If you have any dental work done, tell your dentist you are receiving this medicine. Avoid taking products that contain aspirin, acetaminophen, ibuprofen, naproxen, or ketoprofen unless instructed by your doctor. These medicines may hide a fever. Do not become pregnant while taking this medicine. Women should inform their doctor if they wish to become pregnant or think they might be pregnant. There is a potential for serious side effects to an unborn child. Talk to your health care professional or pharmacist for more information. Do not breast-feed an infant while taking this medicine. Men should inform their doctor if they wish to father a child. This medicine may lower sperm counts. Do not treat diarrhea  with over the counter products. Contact your doctor if you have diarrhea that lasts more than 2 days or if it is severe and watery. This medicine can make you more sensitive to the sun. Keep out of the sun. If you cannot avoid being in the sun, wear protective clothing and use sunscreen. Do not use sun lamps or tanning beds/booths. What side effects may I notice from receiving this medicine? Side effects that you should report to your doctor or health care professional as soon as possible: -allergic reactions like skin rash, itching or hives, swelling of the face, lips, or tongue -low blood counts - this medicine may decrease the number of white blood cells, red blood cells and platelets. You may be at increased risk for infections and bleeding. -signs of infection - fever or chills, cough, sore throat, pain or difficulty passing urine -signs of decreased platelets or bleeding - bruising, pinpoint red spots on the skin, black, tarry stools, blood in the urine -signs of decreased red blood cells - unusually weak or tired, fainting spells, lightheadedness -breathing problems -changes in vision -chest pain -mouth sores -nausea and vomiting -pain, swelling, redness at site where injected -pain, tingling, numbness in the hands or feet -redness, swelling, or sores on hands or feet -stomach pain -unusual bleeding Side effects that usually do not require medical attention (report to your doctor or health care professional if they continue or are bothersome): -changes in finger or toe nails -diarrhea -dry or itchy skin -hair loss -headache -loss of appetite -sensitivity of eyes to the light -stomach upset -unusually teary eyes This list may not describe all possible side effects. Call your doctor for medical advice about side effects. You may report side effects to FDA at 1-800-FDA-1088. Where should I keep my medicine? This drug is given in a hospital or clinic and will not be stored at  home. NOTE: This sheet is a summary. It may not cover all possible information. If you have questions about this medicine, talk to your doctor, pharmacist, or health care provider.  2018 Elsevier/Gold Standard (2007-10-02 13:53:16)   Leucovorin injection What is this medicine? LEUCOVORIN (loo koe VOR in) is used to prevent or treat the harmful effects of some medicines. This medicine is used to treat anemia caused by a low amount of folic acid in the body. It is also used with 5-fluorouracil (5-FU) to treat colon cancer. This medicine may be used for other purposes; ask your  health care provider or pharmacist if you have questions. What should I tell my health care provider before I take this medicine? They need to know if you have any of these conditions: -anemia from low levels of vitamin B-12 in the blood -an unusual or allergic reaction to leucovorin, folic acid, other medicines, foods, dyes, or preservatives -pregnant or trying to get pregnant -breast-feeding How should I use this medicine? This medicine is for injection into a muscle or into a vein. It is given by a health care professional in a hospital or clinic setting. Talk to your pediatrician regarding the use of this medicine in children. Special care may be needed. Overdosage: If you think you have taken too much of this medicine contact a poison control center or emergency room at once. NOTE: This medicine is only for you. Do not share this medicine with others. What if I miss a dose? This does not apply. What may interact with this medicine? -capecitabine -fluorouracil -phenobarbital -phenytoin -primidone -trimethoprim-sulfamethoxazole This list may not describe all possible interactions. Give your health care provider a list of all the medicines, herbs, non-prescription drugs, or dietary supplements you use. Also tell them if you smoke, drink alcohol, or use illegal drugs. Some items may interact with your  medicine. What should I watch for while using this medicine? Your condition will be monitored carefully while you are receiving this medicine. This medicine may increase the side effects of 5-fluorouracil, 5-FU. Tell your doctor or health care professional if you have diarrhea or mouth sores that do not get better or that get worse. What side effects may I notice from receiving this medicine? Side effects that you should report to your doctor or health care professional as soon as possible: -allergic reactions like skin rash, itching or hives, swelling of the face, lips, or tongue -breathing problems -fever, infection -mouth sores -unusual bleeding or bruising -unusually weak or tired Side effects that usually do not require medical attention (report to your doctor or health care professional if they continue or are bothersome): -constipation or diarrhea -loss of appetite -nausea, vomiting This list may not describe all possible side effects. Call your doctor for medical advice about side effects. You may report side effects to FDA at 1-800-FDA-1088. Where should I keep my medicine? This drug is given in a hospital or clinic and will not be stored at home. NOTE: This sheet is a summary. It may not cover all possible information. If you have questions about this medicine, talk to your doctor, pharmacist, or health care provider.  2018 Elsevier/Gold Standard (2007-12-03 16:50:29)

## 2016-12-08 ENCOUNTER — Ambulatory Visit (HOSPITAL_BASED_OUTPATIENT_CLINIC_OR_DEPARTMENT_OTHER): Payer: Federal, State, Local not specified - PPO

## 2016-12-08 ENCOUNTER — Encounter: Payer: Self-pay | Admitting: Oncology

## 2016-12-08 VITALS — BP 122/74 | HR 74 | Temp 97.8°F | Resp 16

## 2016-12-08 DIAGNOSIS — C20 Malignant neoplasm of rectum: Secondary | ICD-10-CM

## 2016-12-08 MED ORDER — HEPARIN SOD (PORK) LOCK FLUSH 100 UNIT/ML IV SOLN
500.0000 [IU] | Freq: Once | INTRAVENOUS | Status: AC | PRN
  Administered 2016-12-08: 500 [IU]
  Filled 2016-12-08: qty 5

## 2016-12-08 MED ORDER — SODIUM CHLORIDE 0.9% FLUSH
10.0000 mL | INTRAVENOUS | Status: DC | PRN
  Administered 2016-12-08: 10 mL
  Filled 2016-12-08: qty 10

## 2016-12-08 NOTE — Patient Instructions (Signed)
Implanted Port Home Guide An implanted port is a type of central line that is placed under the skin. Central lines are used to provide IV access when treatment or nutrition needs to be given through a person's veins. Implanted ports are used for long-term IV access. An implanted port may be placed because:  You need IV medicine that would be irritating to the small veins in your hands or arms.  You need long-term IV medicines, such as antibiotics.  You need IV nutrition for a long period.  You need frequent blood draws for lab tests.  You need dialysis.  Implanted ports are usually placed in the chest area, but they can also be placed in the upper arm, the abdomen, or the leg. An implanted port has two main parts:  Reservoir. The reservoir is round and will appear as a small, raised area under your skin. The reservoir is the part where a needle is inserted to give medicines or draw blood.  Catheter. The catheter is a thin, flexible tube that extends from the reservoir. The catheter is placed into a large vein. Medicine that is inserted into the reservoir goes into the catheter and then into the vein.  How will I care for my incision site? Do not get the incision site wet. Bathe or shower as directed by your health care provider. How is my port accessed? Special steps must be taken to access the port:  Before the port is accessed, a numbing cream can be placed on the skin. This helps numb the skin over the port site.  Your health care provider uses a sterile technique to access the port. ? Your health care provider must put on a mask and sterile gloves. ? The skin over your port is cleaned carefully with an antiseptic and allowed to dry. ? The port is gently pinched between sterile gloves, and a needle is inserted into the port.  Only "non-coring" port needles should be used to access the port. Once the port is accessed, a blood return should be checked. This helps ensure that the port  is in the vein and is not clogged.  If your port needs to remain accessed for a constant infusion, a clear (transparent) bandage will be placed over the needle site. The bandage and needle will need to be changed every week, or as directed by your health care provider.  Keep the bandage covering the needle clean and dry. Do not get it wet. Follow your health care provider's instructions on how to take a shower or bath while the port is accessed.  If your port does not need to stay accessed, no bandage is needed over the port.  What is flushing? Flushing helps keep the port from getting clogged. Follow your health care provider's instructions on how and when to flush the port. Ports are usually flushed with saline solution or a medicine called heparin. The need for flushing will depend on how the port is used.  If the port is used for intermittent medicines or blood draws, the port will need to be flushed: ? After medicines have been given. ? After blood has been drawn. ? As part of routine maintenance.  If a constant infusion is running, the port may not need to be flushed.  How long will my port stay implanted? The port can stay in for as long as your health care provider thinks it is needed. When it is time for the port to come out, surgery will be   done to remove it. The procedure is similar to the one performed when the port was put in. When should I seek immediate medical care? When you have an implanted port, you should seek immediate medical care if:  You notice a bad smell coming from the incision site.  You have swelling, redness, or drainage at the incision site.  You have more swelling or pain at the port site or the surrounding area.  You have a fever that is not controlled with medicine.  This information is not intended to replace advice given to you by your health care provider. Make sure you discuss any questions you have with your health care provider. Document  Released: 05/29/2005 Document Revised: 11/04/2015 Document Reviewed: 02/03/2013 Elsevier Interactive Patient Education  2017 Elsevier Inc.  

## 2016-12-08 NOTE — Progress Notes (Signed)
Met w/ pt to introduce myself as his Arboriculturist and to discuss copay assistance w/ [employer].  Because pt has Medicare A he is not eligible for the [employer] copay card.  He is also overqualified for the [employer].  He has my card for any questions or concerns he may have in the future.

## 2016-12-10 NOTE — H&P (Signed)
HPI: Nicholas Suarez is a 66 year-old male patient with hydronephrosis and an abnormal area noted within the bladder on cystoscopy.  His problem began 10/20/2016. The cyst is on both sides. He had the following imaging studies done: CT Scan and MRI Scan.   He is not currently having flank pain, back pain, groin pain, nausea, vomiting, fever or chills.   He has not had kidney surgery. He has not had a stent placed in his kidney. He has not received radiation therapy.   11/08/16: He underwent an MRI scan of the lumbar spine on 10/20/16 which revealed retroperitoneal adenopathy and bilateral hydronephrosis. That same day a CT scan was obtained which revealed a simple cyst in the left kidney as well as bilateral hydronephrosis with some form of interstitial inflammatory process involving the retroperitoneum and abdominal and iliac adenopathy. In addition there appeared to be some thickening of the wall of the bladder circumferentially but greater in the area of the dome of the bladder with no mass seen within the bladder.   He reports that he first started noticing an increase need to have a bowel movement with frequency and urgency of his bowels and having a bowel movement 4-5 times per day. He also has noted some urgency and 2 occasions he has had episodes of nocturnal enuresis. He does have some daytime frequency. He has nocturia 3. He has not had any hematuria or dysuria.     ALLERGIES: None   MEDICATIONS: Aspirin 325 mg tablet  Montelukast Sodium  Multiple Vitamin     GU PSH: None   NON-GU PSH: Hernia Repair    GU PMH: None   NON-GU PMH: None   FAMILY HISTORY: Cancer - Mother Death of family member - Father, Mother Thyroid Disorder - Brother   SOCIAL HISTORY: Marital Status: Married Current Smoking Status: Patient does not smoke anymore. Has not smoked since 10/10/1996. Smoked for 10 years. Smoked 1 pack per day.   Tobacco Use Assessment Completed: Used Tobacco in last 30 days? Has  never drank.  Drinks 4+ caffeinated drinks per day. 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    REVIEW OF SYSTEMS:    GU Review Male:   Patient reports frequent urination, hard to postpone urination, and get up at night to urinate. Patient denies burning/ pain with urination, leakage of urine, stream starts and stops, trouble starting your stream, have to strain to urinate , erection problems, and penile pain.  Gastrointestinal (Upper):   Patient denies nausea, vomiting, and indigestion/ heartburn.  Gastrointestinal (Lower):   Patient reports diarrhea and constipation.   Constitutional:   Patient denies fever, night sweats, weight loss, and fatigue.  Skin:   Patient denies itching and skin rash/ lesion.  Eyes:   Patient denies blurred vision and double vision.  Ears/ Nose/ Throat:   Patient denies sore throat and sinus problems.  Hematologic/Lymphatic:   Patient denies swollen glands and easy bruising.  Cardiovascular:   Patient reports leg swelling. Patient denies chest pains.  Respiratory:   Patient denies cough and shortness of breath.  Endocrine:   Patient denies excessive thirst.  Musculoskeletal:   Patient reports joint pain. Patient denies back pain.  Neurological:   Patient denies headaches and dizziness.  Psychologic:   Patient denies depression and anxiety.   VITAL SIGNS:    Weight 210 lb / 95.25 kg  Height 75 in / 190.5 cm  BP 132/86 mmHg  Pulse 83 /min  BMI 26.2 kg/m   GU PHYSICAL EXAMINATION:  Anus and Perineum: No hemorrhoids. No anal stenosis. No rectal fissure, no anal fissure. No edema, no dimple, no perineal tenderness, no anal tenderness.  Scrotum: No lesions. No edema. No cysts. No warts.  Epididymides: Right: no spermatocele, no masses, no cysts, no tenderness, no induration, no enlargement. Left: no spermatocele, no masses, no cysts, no tenderness, no induration, no enlargement.  Testes: < 3 cm hydrocele left testis. < 3 cm hydrocele right testis. No  tenderness, no swelling, no enlargement left testis. No tenderness, no swelling, no enlargement right testis. Normal location left testis. Normal location right testis. No mass, no cyst, no varicocele left testis. No mass, no cyst, no varicocele right testis.   Urethral Meatus: Normal size. No lesion, no wart, no discharge, no polyp. Normal location.  Penis: Penis uncircumcised. No foreskin warts, no cracks. No dorsal peyronie's plaques, no left corporal peyronie's plaques, no right corporal peyronie's plaques, no scarring, no shaft warts. No balanitis, no meatal stenosis.   Prostate: 40 gram or 2+ size. Left lobe normal consistency, right lobe normal consistency. Symmetrical lobes. No prostate nodule. Left lobe no tenderness, right lobe no tenderness.  Seminal Vesicles: Nonpalpable.  Sphincter Tone: Normal sphincter. No rectal tenderness. No rectal mass.    MULTI-SYSTEM PHYSICAL EXAMINATION:    Constitutional: Well-nourished. No physical deformities. Normally developed. Good grooming.  Neck: Neck symmetrical, not swollen. Normal tracheal position.  Respiratory: No labored breathing, no use of accessory muscles.   Cardiovascular: Normal temperature, normal extremity pulses, no swelling, no varicosities.  Lymphatic: No enlargement of neck, axillae, groin.  Skin: No paleness, no jaundice, no cyanosis. No lesion, no ulcer, no rash.  Neurologic / Psychiatric: Oriented to time, oriented to place, oriented to person. No depression, no anxiety, no agitation.  Gastrointestinal: No mass, no tenderness, no rigidity, non obese abdomen.  Eyes: Normal conjunctivae. Normal eyelids.  Ears, Nose, Mouth, and Throat: Left ear no scars, no lesions, no masses. Right ear no scars, no lesions, no masses. Nose no scars, no lesions, no masses. Normal hearing. Normal lips.  Musculoskeletal: Normal gait and station of head and neck.     PAST DATA REVIEWED:  Source Of History:  Patient  Lab Test Review:   PSA,  BUN/Creatinine, CBC with Diff  Records Review:   Previous Doctor Records, POC Tool  Urine Test Review:   Urinalysis  X-Ray Review: C.T. Abdomen/Pelvis: Reviewed Films. Reviewed Report. Discussed With Patient.  MRI Lumbar Spine: Reviewed Report. Discussed With Patient.    Notes:                     On 11/01/16 he was found to have a normal PSA of 0.41, white blood cell count that was normal at 5.9, a normal creatinine of 1.16 and a urinalysis that was completely clear.   PROCEDURES:         Flexible Cystoscopy - done on 11/09/16  Meatus:  Normal size. Normal location. Normal condition.  Urethra:  No strictures.  External Sphincter:  Normal.  Verumontanum:  Normal.  Prostate:  Non-obstructing. No hyperplasia.  Bladder Neck:  Non-obstructing.  Ureteral Orifices:  Normal location. Normal size. Normal shape. Effluxed clear urine.  Bladder:  Moderate trabeculation. No tumors. Normal mucosa. No stones. There was a slightly reddened area on the posterior wall of the bladder near the dome.      The lower urinary tract was carefully examined. The procedure was well-tolerated and without complications. Instructions were given to call the office immediately for bloody  urine, difficulty urinating, urinary retention, painful or frequent urination, fever or other illness. The patient stated that he understood these instructions and would comply with them.         Urinalysis w/Scope Dipstick Dipstick Cont'd Micro  Color: Yellow Bilirubin: Neg WBC/hpf: NS (Not Seen)  Appearance: Clear Ketones: Neg RBC/hpf: 0 - 2/hpf  Specific Gravity: 1.015 Blood: Trace Bacteria: NS (Not Seen)  pH: 5.5 Protein: Neg Cystals: NS (Not Seen)  Glucose: Neg Urobilinogen: 0.2 Casts: NS (Not Seen)    Nitrites: Neg Trichomonas: Not Present    Leukocyte Esterase: Neg Mucous: Not Present      Epithelial Cells: NS (Not Seen)      Yeast: NS (Not Seen)      Sperm: Not Present    ASSESSMENT/PLAN:     ICD-10 Details  1 GU:   Oth  hydronephrosis - N13.39 Bilateral, He appears to have bilateral hydronephrosis secondary to some form of retroperitoneal inflammatory process with associated adenopathy. While a lymphoproliferative disorder is the most likely cause of this is yet to be determined. I note however that he maintains a normal creatinine and because of that we discussed the fact that I do not feel bilateral double-J stents are necessary at this time. Should his renal function show deterioration then stent placement would be indicated.  2   Abnormal radiologic findings on diagnostic imaging of of renal pelvis, ureter, or bladder - R93.41 The area in his bladder appears slightly abnormal and I think there is a very low probability that there is going to be malignancy however the cause of his adenopathy and the fact that he has had some new urinary symptoms warrant further evaluation with a biopsy of the bladder. I therefore have discussed this procedure with the patient in detail including its risks and complications, the outpatient nature of the procedure, the probability of success as well as the anticipated postoperative course. He understands and has elected to proceed.  4   Hydrocele - N43.0 Bilateral, He has small bilateral hydroceles likely secondary to the obstructive process that's occurring with his lymphatic system. These are asymptomatic.  3 NON-GU:   Enlarged lymph nodes, unspecified - R59.9 He has some form of lymphadenopathy in the abdomen and pelvis but no abnormality on DRE and PSA that is completely normal at 0.41. He underwent biopsy of one of his lymph nodes which has revealed the adenopathy is secondary to metastatic colon cancer.

## 2016-12-11 ENCOUNTER — Ambulatory Visit (HOSPITAL_BASED_OUTPATIENT_CLINIC_OR_DEPARTMENT_OTHER): Payer: Federal, State, Local not specified - PPO | Admitting: Anesthesiology

## 2016-12-11 ENCOUNTER — Encounter (HOSPITAL_BASED_OUTPATIENT_CLINIC_OR_DEPARTMENT_OTHER)
Admission: RE | Disposition: A | Discharge status: Home / Self Care | Payer: Self-pay | Source: Ambulatory Visit | Attending: Urology

## 2016-12-11 ENCOUNTER — Encounter (HOSPITAL_BASED_OUTPATIENT_CLINIC_OR_DEPARTMENT_OTHER): Payer: Self-pay

## 2016-12-11 ENCOUNTER — Ambulatory Visit (HOSPITAL_BASED_OUTPATIENT_CLINIC_OR_DEPARTMENT_OTHER)
Admission: RE | Admit: 2016-12-11 | Discharge: 2016-12-11 | Disposition: A | Discharge status: Home / Self Care | Payer: Federal, State, Local not specified - PPO | Source: Ambulatory Visit | Attending: Urology | Admitting: Urology

## 2016-12-11 ENCOUNTER — Other Ambulatory Visit (HOSPITAL_COMMUNITY)
Admission: RE | Admit: 2016-12-11 | Discharge: 2016-12-11 | Disposition: A | Discharge status: Home / Self Care | Payer: Federal, State, Local not specified - PPO | Source: Ambulatory Visit | Attending: Family Medicine | Admitting: Family Medicine

## 2016-12-11 DIAGNOSIS — Z87891 Personal history of nicotine dependence: Secondary | ICD-10-CM | POA: Diagnosis not present

## 2016-12-11 DIAGNOSIS — Z79899 Other long term (current) drug therapy: Secondary | ICD-10-CM | POA: Insufficient documentation

## 2016-12-11 DIAGNOSIS — C20 Malignant neoplasm of rectum: Secondary | ICD-10-CM | POA: Diagnosis not present

## 2016-12-11 DIAGNOSIS — Z7982 Long term (current) use of aspirin: Secondary | ICD-10-CM | POA: Insufficient documentation

## 2016-12-11 DIAGNOSIS — N329 Bladder disorder, unspecified: Secondary | ICD-10-CM

## 2016-12-11 DIAGNOSIS — N432 Other hydrocele: Secondary | ICD-10-CM | POA: Insufficient documentation

## 2016-12-11 DIAGNOSIS — Z9221 Personal history of antineoplastic chemotherapy: Secondary | ICD-10-CM | POA: Diagnosis not present

## 2016-12-11 DIAGNOSIS — C786 Secondary malignant neoplasm of retroperitoneum and peritoneum: Secondary | ICD-10-CM | POA: Insufficient documentation

## 2016-12-11 DIAGNOSIS — R590 Localized enlarged lymph nodes: Secondary | ICD-10-CM | POA: Insufficient documentation

## 2016-12-11 DIAGNOSIS — C7911 Secondary malignant neoplasm of bladder: Secondary | ICD-10-CM | POA: Diagnosis not present

## 2016-12-11 DIAGNOSIS — N1330 Unspecified hydronephrosis: Secondary | ICD-10-CM | POA: Diagnosis present

## 2016-12-11 HISTORY — DX: Other seasonal allergic rhinitis: J30.2

## 2016-12-11 HISTORY — DX: Other constipation: K59.09

## 2016-12-11 HISTORY — PX: CYSTOSCOPY WITH BIOPSY: SHX5122

## 2016-12-11 HISTORY — DX: Presence of spectacles and contact lenses: Z97.3

## 2016-12-11 HISTORY — DX: Unspecified hydronephrosis: N13.30

## 2016-12-11 HISTORY — DX: Localized edema: R60.0

## 2016-12-11 HISTORY — DX: Hydrocele, unspecified: N43.3

## 2016-12-11 SURGERY — CYSTOSCOPY, WITH BIOPSY
Anesthesia: General | Site: Bladder

## 2016-12-11 MED ORDER — DEXAMETHASONE SODIUM PHOSPHATE 10 MG/ML IJ SOLN
INTRAMUSCULAR | Status: DC | PRN
  Administered 2016-12-11: 10 mg via INTRAVENOUS

## 2016-12-11 MED ORDER — FENTANYL CITRATE (PF) 100 MCG/2ML IJ SOLN
INTRAMUSCULAR | Status: DC | PRN
  Administered 2016-12-11 (×2): 25 ug via INTRAVENOUS

## 2016-12-11 MED ORDER — PROPOFOL 10 MG/ML IV BOLUS
INTRAVENOUS | Status: AC
  Filled 2016-12-11: qty 20

## 2016-12-11 MED ORDER — KETOROLAC TROMETHAMINE 30 MG/ML IJ SOLN
INTRAMUSCULAR | Status: DC | PRN
  Administered 2016-12-11: 15 mg via INTRAVENOUS

## 2016-12-11 MED ORDER — LIDOCAINE 2% (20 MG/ML) 5 ML SYRINGE
INTRAMUSCULAR | Status: AC
  Filled 2016-12-11: qty 5

## 2016-12-11 MED ORDER — LIDOCAINE 2% (20 MG/ML) 5 ML SYRINGE
INTRAMUSCULAR | Status: DC | PRN
  Administered 2016-12-11: 80 mg via INTRAVENOUS

## 2016-12-11 MED ORDER — KETOROLAC TROMETHAMINE 30 MG/ML IJ SOLN
INTRAMUSCULAR | Status: AC
  Filled 2016-12-11: qty 1

## 2016-12-11 MED ORDER — FENTANYL CITRATE (PF) 100 MCG/2ML IJ SOLN
INTRAMUSCULAR | Status: AC
  Filled 2016-12-11: qty 2

## 2016-12-11 MED ORDER — PROPOFOL 10 MG/ML IV BOLUS
INTRAVENOUS | Status: DC | PRN
  Administered 2016-12-11: 200 mg via INTRAVENOUS

## 2016-12-11 MED ORDER — ONDANSETRON HCL 4 MG/2ML IJ SOLN
INTRAMUSCULAR | Status: DC | PRN
  Administered 2016-12-11: 4 mg via INTRAVENOUS

## 2016-12-11 MED ORDER — OXYCODONE HCL 5 MG PO TABS
5.0000 mg | ORAL_TABLET | Freq: Once | ORAL | Status: DC | PRN
  Filled 2016-12-11: qty 1

## 2016-12-11 MED ORDER — ONDANSETRON HCL 4 MG/2ML IJ SOLN
INTRAMUSCULAR | Status: AC
  Filled 2016-12-11: qty 2

## 2016-12-11 MED ORDER — DEXAMETHASONE SODIUM PHOSPHATE 10 MG/ML IJ SOLN
INTRAMUSCULAR | Status: AC
  Filled 2016-12-11: qty 1

## 2016-12-11 MED ORDER — OXYCODONE HCL 5 MG/5ML PO SOLN
5.0000 mg | Freq: Once | ORAL | Status: DC | PRN
  Filled 2016-12-11: qty 5

## 2016-12-11 MED ORDER — CIPROFLOXACIN IN D5W 400 MG/200ML IV SOLN
400.0000 mg | INTRAVENOUS | Status: AC
  Administered 2016-12-11: 400 mg via INTRAVENOUS
  Filled 2016-12-11: qty 200

## 2016-12-11 MED ORDER — CIPROFLOXACIN IN D5W 400 MG/200ML IV SOLN
INTRAVENOUS | Status: AC
  Filled 2016-12-11: qty 200

## 2016-12-11 MED ORDER — LACTATED RINGERS IV SOLN
INTRAVENOUS | Status: DC
  Administered 2016-12-11: 08:00:00 via INTRAVENOUS
  Filled 2016-12-11: qty 1000

## 2016-12-11 MED ORDER — FENTANYL CITRATE (PF) 100 MCG/2ML IJ SOLN
25.0000 ug | INTRAMUSCULAR | Status: DC | PRN
  Filled 2016-12-11: qty 1

## 2016-12-11 MED ORDER — PHENAZOPYRIDINE HCL 200 MG PO TABS: 200.0000 mg | ORAL_TABLET | Freq: Three times a day (TID) | ORAL | 0 refills | Status: DC | PRN

## 2016-12-11 SURGICAL SUPPLY — 17 items
BAG DRAIN URO-CYSTO SKYTR STRL (DRAIN) ×2 IMPLANT
CLOTH BEACON ORANGE TIMEOUT ST (SAFETY) ×2 IMPLANT
ELECT REM PT RETURN 9FT ADLT (ELECTROSURGICAL) ×2
ELECTRODE REM PT RTRN 9FT ADLT (ELECTROSURGICAL) ×1 IMPLANT
GLOVE BIO SURGEON STRL SZ8 (GLOVE) ×2 IMPLANT
GOWN STRL REUS W/ TWL LRG LVL3 (GOWN DISPOSABLE) ×1 IMPLANT
GOWN STRL REUS W/ TWL XL LVL3 (GOWN DISPOSABLE) ×1 IMPLANT
GOWN STRL REUS W/TWL LRG LVL3 (GOWN DISPOSABLE) ×1
GOWN STRL REUS W/TWL XL LVL3 (GOWN DISPOSABLE) ×1
IV NS IRRIG 3000ML ARTHROMATIC (IV SOLUTION) IMPLANT
KIT RM TURNOVER CYSTO AR (KITS) ×2 IMPLANT
MANIFOLD NEPTUNE II (INSTRUMENTS) ×2 IMPLANT
NEEDLE HYPO 22GX1.5 SAFETY (NEEDLE) IMPLANT
NS IRRIG 500ML POUR BTL (IV SOLUTION) ×2 IMPLANT
PACK CYSTO (CUSTOM PROCEDURE TRAY) ×2 IMPLANT
TUBE CONNECTING 12X1/4 (SUCTIONS) ×2 IMPLANT
WATER STERILE IRR 3000ML UROMA (IV SOLUTION) ×2 IMPLANT

## 2016-12-11 NOTE — Anesthesia Postprocedure Evaluation (Signed)
Anesthesia Post Note  Patient: Nicholas Suarez  Procedure(s) Performed: Procedure(s) (LRB): CYSTOSCOPY WITH BIOPSY (N/A)     Patient location during evaluation: PACU Anesthesia Type: General Level of consciousness: awake and alert Pain management: pain level controlled Vital Signs Assessment: post-procedure vital signs reviewed and stable Respiratory status: spontaneous breathing, nonlabored ventilation, respiratory function stable and patient connected to nasal cannula oxygen Cardiovascular status: blood pressure returned to baseline and stable Postop Assessment: no signs of nausea or vomiting Anesthetic complications: no    Last Vitals:  Vitals:   12/11/16 1015 12/11/16 1106  BP: (!) 132/93 135/76  Pulse: 67 63  Resp: 17 14  Temp:  36.7 C    Last Pain:  Vitals:   12/11/16 0736  TempSrc: Oral                 Lochlyn Zullo

## 2016-12-11 NOTE — Op Note (Signed)
PATIENT:  Nicholas Suarez  PRE-OPERATIVE DIAGNOSIS:   POST-OPERATIVE DIAGNOSIS: Same  PROCEDURE: Cystoscopy with bladder biopsy  SURGEON:  Claybon Jabs  INDICATION: Nicholas Suarez is a 66 year old male who initially was seen for mild bilateral hydronephrosis with no elevation of his creatinine and described experiencing frequency, nocturia and 2 episodes of nocturnal enuresis. Cystoscopic evaluation in the office revealed mildly erythematous areas on the posterior wall the bladder. There were no papillary lesions within the bladder and the urine cytology was negative for high-grade urothelial cancer. He reports to me today in preop holding that he is not having any voiding symptoms at this time. He is currently undergoing chemotherapy for newly diagnosed metastatic rectal carcinoma.  ANESTHESIA:  General  EBL:  Minimal  DRAINS: None  LOCAL MEDICATIONS USED:  None  SPECIMEN:  Bladder biopsies to pathology  Description of procedure: After informed consent the patient was taken to the operating room and placed on the table in a supine position. General anesthesia was then administered. Once fully anesthetized the patient was moved to the dorsal lithotomy position and the genitalia were sterilely prepped and draped in standard fashion. An official timeout was then performed.  The 23 French cystoscope was passed under direct vision with the 30 lens and the urethra was noted be normal. The prostatic urethra revealed no obstruction. The bladder was then entered and noted to have some trabeculation. Ureteral orifices were noted to be of normal configuration and position. There were some erythematous areas on the posterior wall the bladder. They were photographed. I then used the cold cup biopsy forceps to obtain to biopsies from the erythematous regions and then these were fulgurated with the Bugbee electrode. No further bleeding was noted and therefore the bladder was drained, the cystoscope was  removed and the patient was awakened and taken to the recovery room in stable and satisfactory condition. He tolerated procedure well no intraoperative complications.  PLAN OF CARE: Discharge to home after PACU  PATIENT DISPOSITION:  PACU - hemodynamically stable.

## 2016-12-11 NOTE — Anesthesia Preprocedure Evaluation (Addendum)
Anesthesia Evaluation  Patient identified by MRN, date of birth, ID band Patient awake    Reviewed: Allergy & Precautions, NPO status , Patient's Chart, lab work & pertinent test results  History of Anesthesia Complications Negative for: history of anesthetic complications  Airway Mallampati: II  TM Distance: >3 FB Neck ROM: Full    Dental  (+) Teeth Intact   Pulmonary neg shortness of breath, neg sleep apnea, neg COPD, neg recent URI, former smoker,    breath sounds clear to auscultation       Cardiovascular negative cardio ROS   Rhythm:Regular     Neuro/Psych negative neurological ROS  negative psych ROS   GI/Hepatic negative GI ROS, Neg liver ROS,   Endo/Other  negative endocrine ROS  Renal/GU Renal InsufficiencyRenal disease     Musculoskeletal   Abdominal   Peds  Hematology  (+) anemia ,   Anesthesia Other Findings   Reproductive/Obstetrics                            Anesthesia Physical Anesthesia Plan  ASA: II  Anesthesia Plan: General   Post-op Pain Management:    Induction: Intravenous  PONV Risk Score and Plan: 1 and Ondansetron  Airway Management Planned: LMA  Additional Equipment: None  Intra-op Plan:   Post-operative Plan: Extubation in OR  Informed Consent: I have reviewed the patients History and Physical, chart, labs and discussed the procedure including the risks, benefits and alternatives for the proposed anesthesia with the patient or authorized representative who has indicated his/her understanding and acceptance.   Dental advisory given  Plan Discussed with: CRNA and Surgeon  Anesthesia Plan Comments:        Anesthesia Quick Evaluation

## 2016-12-11 NOTE — Transfer of Care (Signed)
Immediate Anesthesia Transfer of Care Note  Patient: Nicholas Suarez  Procedure(s) Performed: Procedure(s): CYSTOSCOPY WITH BIOPSY (N/A)  Patient Location: PACU  Anesthesia Type:General  Level of Consciousness: awake, alert  and oriented  Airway & Oxygen Therapy: Patient Spontanous Breathing and Patient connected to nasal cannula oxygen  Post-op Assessment: Report given to RN  Post vital signs: Reviewed and stable  Last Vitals: 123/84, 73, 15, 100%, 97.6 Vitals:   12/11/16 0736  BP: 138/82  Pulse: 80  Resp: 18  Temp: 36.4 C    Last Pain:  Vitals:   12/11/16 0736  TempSrc: Oral      Patients Stated Pain Goal: 8 (68/37/29 0211)  Complications: No apparent anesthesia complications

## 2016-12-11 NOTE — Discharge Instructions (Signed)
Cystoscopy patient instructions ° °Following a cystoscopy, a catheter (a flexible rubber tube) is sometimes left in place to empty the bladder. This may cause some discomfort or a feeling that you need to urinate. Your doctor determines the period of time that the catheter will be left in place. °You may have bloody urine for two to three days (Call your doctor if the amount of bleeding increases or does not subside). ° °You may pass blood clots in your urine, especially if you had a biopsy. It is not unusual to pass small blood clots and have some bloody urine a couple of weeks after your cystoscopy. Again, call your doctor if the bleeding does not subside. °You may have: °Dysuria (painful urination) °Frequency (urinating often) °Urgency (strong desire to urinate) ° °These symptoms are common especially if medicine is instilled into the bladder or a ureteral stent is placed. Avoiding alcohol and caffeine, such as coffee, tea, and chocolate, may help relieve these symptoms. Drink plenty of water, unless otherwise instructed. Your doctor may also prescribe an antibiotic or other medicine to reduce these symptoms. ° °Cystoscopy results are available soon after the procedure; biopsy results usually take two to four days. Your doctor will discuss the results of your exam with you. Before you go home, you will be given specific instructions for follow-up care. °Special Instructions: ° °1  If you are going home with a catheter in place do not take a tub bath until removed by your doctor.  °2  You may resume your normal activities.  °3  Do not drive or operate machinery if you are taking narcotic pain medicine.  °4  Be sure to keep all follow-up appointments with your doctor.  ° °5 Call Your Doctor If: The catheter is not draining ° You have severe pain ° You are unable to urinate ° You have a fever over 101 ° You have severe bleeding °        ° °Post Anesthesia Home Care Instructions ° °Activity: °Get plenty of rest for  the remainder of the day. A responsible individual must stay with you for 24 hours following the procedure.  °For the next 24 hours, DO NOT: °-Drive a car °-Operate machinery °-Drink alcoholic beverages °-Take any medication unless instructed by your physician °-Make any legal decisions or sign important papers. ° °Meals: °Start with liquid foods such as gelatin or soup. Progress to regular foods as tolerated. Avoid greasy, spicy, heavy foods. If nausea and/or vomiting occur, drink only clear liquids until the nausea and/or vomiting subsides. Call your physician if vomiting continues. ° °Special Instructions/Symptoms: °Your throat may feel dry or sore from the anesthesia or the breathing tube placed in your throat during surgery. If this causes discomfort, gargle with warm salt water. The discomfort should disappear within 24 hours. ° °If you had a scopolamine patch placed behind your ear for the management of post- operative nausea and/or vomiting: ° °1. The medication in the patch is effective for 72 hours, after which it should be removed.  Wrap patch in a tissue and discard in the trash. Wash hands thoroughly with soap and water. °2. You may remove the patch earlier than 72 hours if you experience unpleasant side effects which may include dry mouth, dizziness or visual disturbances. °3. Avoid touching the patch. Wash your hands with soap and water after contact with the patch. °  ° °

## 2016-12-11 NOTE — Anesthesia Procedure Notes (Signed)
Procedure Name: LMA Insertion Date/Time: 12/11/2016 9:06 AM Performed by: Bethena Roys T Pre-anesthesia Checklist: Patient identified, Emergency Drugs available, Suction available and Patient being monitored Patient Re-evaluated:Patient Re-evaluated prior to inductionOxygen Delivery Method: Circle system utilized Preoxygenation: Pre-oxygenation with 100% oxygen Intubation Type: IV induction Ventilation: Mask ventilation without difficulty LMA: LMA inserted LMA Size: 5.0 Number of attempts: 1 Airway Equipment and Method: Bite block Placement Confirmation: positive ETCO2 Dental Injury: Teeth and Oropharynx as per pre-operative assessment

## 2016-12-12 ENCOUNTER — Other Ambulatory Visit: Payer: Self-pay | Admitting: General Surgery

## 2016-12-12 ENCOUNTER — Encounter (HOSPITAL_BASED_OUTPATIENT_CLINIC_OR_DEPARTMENT_OTHER): Payer: Self-pay | Admitting: Urology

## 2016-12-14 ENCOUNTER — Encounter (HOSPITAL_COMMUNITY): Payer: Self-pay

## 2016-12-14 ENCOUNTER — Ambulatory Visit (HOSPITAL_COMMUNITY)
Admission: RE | Admit: 2016-12-14 | Discharge: 2016-12-14 | Disposition: A | Discharge status: Home / Self Care | Payer: Federal, State, Local not specified - PPO | Source: Ambulatory Visit | Attending: Nurse Practitioner | Admitting: Nurse Practitioner

## 2016-12-14 DIAGNOSIS — C770 Secondary and unspecified malignant neoplasm of lymph nodes of head, face and neck: Secondary | ICD-10-CM | POA: Insufficient documentation

## 2016-12-14 DIAGNOSIS — Z79899 Other long term (current) drug therapy: Secondary | ICD-10-CM | POA: Diagnosis not present

## 2016-12-14 DIAGNOSIS — C20 Malignant neoplasm of rectum: Secondary | ICD-10-CM | POA: Diagnosis present

## 2016-12-14 DIAGNOSIS — Z7982 Long term (current) use of aspirin: Secondary | ICD-10-CM | POA: Diagnosis not present

## 2016-12-14 LAB — CBC
HCT: 31.4 % — ABNORMAL LOW (ref 39.0–52.0)
HEMOGLOBIN: 10.7 g/dL — AB (ref 13.0–17.0)
MCH: 28.7 pg (ref 26.0–34.0)
MCHC: 34.1 g/dL (ref 30.0–36.0)
MCV: 84.2 fL (ref 78.0–100.0)
Platelets: 180 10*3/uL (ref 150–400)
RBC: 3.73 MIL/uL — AB (ref 4.22–5.81)
RDW: 12.5 % (ref 11.5–15.5)
WBC: 5.5 10*3/uL (ref 4.0–10.5)

## 2016-12-14 LAB — APTT: APTT: 29 s (ref 24–36)

## 2016-12-14 LAB — PROTIME-INR
INR: 1.07
PROTHROMBIN TIME: 14 s (ref 11.4–15.2)

## 2016-12-14 MED ORDER — FLUMAZENIL 0.5 MG/5ML IV SOLN
INTRAVENOUS | Status: AC
  Filled 2016-12-14: qty 5

## 2016-12-14 MED ORDER — MIDAZOLAM HCL 2 MG/2ML IJ SOLN
INTRAMUSCULAR | Status: AC
  Filled 2016-12-14: qty 4

## 2016-12-14 MED ORDER — HEPARIN SOD (PORK) LOCK FLUSH 100 UNIT/ML IV SOLN
500.0000 [IU] | INTRAVENOUS | Status: AC | PRN
  Administered 2016-12-14: 500 [IU]
  Filled 2016-12-14: qty 5

## 2016-12-14 MED ORDER — MIDAZOLAM HCL 2 MG/2ML IJ SOLN
INTRAMUSCULAR | Status: AC | PRN
  Administered 2016-12-14 (×2): 1 mg via INTRAVENOUS

## 2016-12-14 MED ORDER — SODIUM CHLORIDE 0.9 % IV SOLN
INTRAVENOUS | Status: DC
  Administered 2016-12-14: 11:00:00 via INTRAVENOUS

## 2016-12-14 MED ORDER — NALOXONE HCL 0.4 MG/ML IJ SOLN
INTRAMUSCULAR | Status: AC
  Filled 2016-12-14: qty 1

## 2016-12-14 MED ORDER — FENTANYL CITRATE (PF) 100 MCG/2ML IJ SOLN
INTRAMUSCULAR | Status: AC
  Filled 2016-12-14: qty 4

## 2016-12-14 MED ORDER — FENTANYL CITRATE (PF) 100 MCG/2ML IJ SOLN
INTRAMUSCULAR | Status: AC | PRN
  Administered 2016-12-14 (×2): 50 ug via INTRAVENOUS

## 2016-12-14 NOTE — H&P (Signed)
Referring Physician(s): Sherrill,B  Supervising Physician: Jacqulynn Cadet  Patient Status:  [facility] OP  Chief Complaint: "I'm here for another biopsy"  Subjective: Patient familiar to IR service from prior left retroperitoneal lymph node biopsy on 11/10/16 as well as right chest wall Port-A-Cath 12/05/16. He has a history of metastatic rectal cancer. 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 He presents again today for left supraclavicular lymph node core biopsy for further evaluation. He denies fever, headache, chest pain, dyspnea, cough, abdominal/back pain, nausea, vomiting or any abnormal bleeding.  Past Medical History:  Diagnosis Date  . Allergic rhinitis, seasonal   . Anemia   . Bilateral hydrocele   . Bilateral hydronephrosis   . Chronic constipation   . Edema of right lower extremity   . Rectal cancer ([place]) dx 11/27/2016 via colonscopy w/ bx   oncolgoist-  dr Benay Spice--  rectal adenocarcinoma w/ metastatic retroperitoneal lymphadenopathy  . Wears glasses    Past Surgical History:  Procedure Laterality Date  . COLONOSCOPY WITH ESOPHAGOGASTRODUODENOSCOPY (EGD)  11-27-2016  dr Earlean Shawl   rectal mass  . CYSTOSCOPY WITH BIOPSY N/A 12/11/2016   Procedure: CYSTOSCOPY WITH BIOPSY;  Surgeon: Kathie Rhodes, MD;  Location: [facility];  Service: Urology;  Laterality: N/A;  . IR FLUORO GUIDE PORT INSERTION RIGHT  12/05/2016  . IR US GUIDE VASC ACCESS RIGHT  12/05/2016  . UMBILICAL HERNIA REPAIR  05/2016     Allergies: Patient has no known allergies.  Medications: Prior to Admission medications   Medication Sig Start Date End Date Taking? Authorizing Provider  aspirin 325 MG tablet Take 325 mg by mouth every 3 (three) days.    Yes [provider]  glucosamine-chondroitin 500-400 MG tablet Take 1 tablet by mouth every 3 (three) days.    Yes [provider]  LYSINE PO Take  by mouth as needed. TAKES FOR FEVER BLISTERS   Yes [provider]  lidocaine-prilocaine (EMLA) cream APPLY TO PORT SITE 1 HOUR PRIOR TO USE 11/30/16   [name], NP  Multiple Vitamin (MULTIVITAMIN) tablet Take 1 tablet by mouth every 3 (three) days.     [provider]  Omega-3 Fatty Acids (FISH OIL) 1200 MG CAPS Take by mouth every 3 (three) days.     [provider]  phenazopyridine (PYRIDIUM) 200 MG tablet Take 1 tablet (200 mg total) by mouth 3 (three) times daily as needed for pain. 12/11/16   Kathie Rhodes, MD  Polyethylene Glycol 3350 (MIRALAX PO) Take by mouth daily.    [provider]  prochlorperazine (COMPAZINE) 10 MG tablet Take 1 tablet (10 mg total) by mouth every 6 (six) hours as needed for nausea or vomiting. 11/30/16   [name], NP  saw palmetto 80 MG capsule Take 80 mg by mouth every 3 (three) days.     [provider]     Vital Signs: BP 136/85   Pulse 75   Temp 98.6 F (37 C) (Oral)   Resp 16   Ht 6\' 3"  (1.905 m)   Wt 202 lb (91.6 kg)   SpO2 99%   BMI 25.25 kg/m   Physical Exam patient awake, alert. Chest clear to auscultation bilaterally. Clean, intact right chest wall Port-A-Cath. Heart with regular rate and rhythm. Abdomen soft, positive bowel sounds, nontender. 1-2+ right lower extremity edema, no left lower extremity edema. Mild fullness left supraclavicular region.  Imaging: No results found.  Labs:  CBC:  Recent Labs  11/10/16 0713 11/23/16 1033 12/05/16 0912 12/14/16 1117  WBC 6.2 5.6 5.5 5.5  HGB 13.0 12.0* 11.4* 10.7*  HCT 38.5* 35.9* 34.6* 31.4*  PLT 179 199 159 180    COAGS:  Recent Labs  11/10/16 0713 12/05/16 1134  INR 0.98 1.07  APTT 29 28    BMP:  Recent Labs  11/01/16 0949 11/23/16 1033 12/05/16 0912  NA 142 142 144  K 4.8 4.2 4.1  CL 107  --   --   CO2 29 28 28   GLUCOSE 105* 98 104  BUN 16 15.7 17.3  CALCIUM 10.4 10.4 10.4  CREATININE 1.16 1.3 1.4*    LIVER  FUNCTION TESTS:  Recent Labs  11/01/16 0949 11/23/16 1033 12/05/16 0912  BILITOT 0.4 0.47 0.39  AST 16 16 16   ALT 13 9 11   ALKPHOS 68 69 68  PROT 6.4 6.6 6.4  ALBUMIN 3.9 3.4* 3.3*    Assessment and Plan: PT with history of metastatic rectal cancer. 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 He has also underwent left retroperitoneal lymph node biopsy which revealed metastatic colon cancer on 11/10/16 and right chest Port-A-Cath placement on 12/05/16. He presents again today for left supraclavicular lymph node core biopsy for further evaluation.Risks and benefits discussed with the patient/wife including, but not limited to bleeding, infection, damage to adjacent structures or low yield requiring additional tests.All of the patient's questions were answered, patient is agreeable to proceed.Consent signed and in chart.     Electronically Signed: D. Rowe Robert, PA-C 12/14/2016, 11:55 AM   I spent a total of 20 minutes  at the the patient's bedside AND on the patient's hospital floor or unit, greater than 50% of which was counseling/coordinating care for image guided left supraclavicular lymph node biopsy

## 2016-12-14 NOTE — Procedures (Signed)
Interventional Radiology Procedure Note  Procedure: US guided core biopsy left supraclavicular adenopathy.   Complications: None  Estimated Blood Loss: None  Recommendations:  - DC home - Path pending  Signed,  Criselda Peaches, MD

## 2016-12-14 NOTE — Discharge Instructions (Signed)

## 2016-12-17 ENCOUNTER — Other Ambulatory Visit: Payer: Self-pay | Admitting: Oncology

## 2016-12-18 ENCOUNTER — Telehealth: Payer: Self-pay | Admitting: *Deleted

## 2016-12-18 NOTE — Telephone Encounter (Signed)
Telephone call to pathology. Foundation One testing added to biopsy tissue from 7/2.

## 2016-12-20 ENCOUNTER — Other Ambulatory Visit: Payer: Federal, State, Local not specified - PPO

## 2016-12-20 ENCOUNTER — Ambulatory Visit (HOSPITAL_BASED_OUTPATIENT_CLINIC_OR_DEPARTMENT_OTHER): Payer: Federal, State, Local not specified - PPO

## 2016-12-20 ENCOUNTER — Other Ambulatory Visit (HOSPITAL_BASED_OUTPATIENT_CLINIC_OR_DEPARTMENT_OTHER): Payer: Federal, State, Local not specified - PPO

## 2016-12-20 ENCOUNTER — Ambulatory Visit (HOSPITAL_BASED_OUTPATIENT_CLINIC_OR_DEPARTMENT_OTHER): Payer: Federal, State, Local not specified - PPO | Admitting: Nurse Practitioner

## 2016-12-20 ENCOUNTER — Ambulatory Visit: Payer: Federal, State, Local not specified - PPO | Admitting: Nurse Practitioner

## 2016-12-20 ENCOUNTER — Ambulatory Visit: Payer: Federal, State, Local not specified - PPO

## 2016-12-20 VITALS — BP 131/73 | HR 68 | Temp 97.7°F | Resp 18 | Ht 75.0 in | Wt 202.6 lb

## 2016-12-20 DIAGNOSIS — C674 Malignant neoplasm of posterior wall of bladder: Secondary | ICD-10-CM | POA: Diagnosis not present

## 2016-12-20 DIAGNOSIS — C20 Malignant neoplasm of rectum: Secondary | ICD-10-CM

## 2016-12-20 DIAGNOSIS — C770 Secondary and unspecified malignant neoplasm of lymph nodes of head, face and neck: Secondary | ICD-10-CM

## 2016-12-20 DIAGNOSIS — Z5111 Encounter for antineoplastic chemotherapy: Secondary | ICD-10-CM | POA: Diagnosis not present

## 2016-12-20 LAB — COMPREHENSIVE METABOLIC PANEL
ALBUMIN: 3.7 g/dL (ref 3.5–5.0)
ALK PHOS: 81 U/L (ref 40–150)
ALT: 15 U/L (ref 0–55)
ANION GAP: 9 meq/L (ref 3–11)
AST: 18 U/L (ref 5–34)
BILIRUBIN TOTAL: 0.41 mg/dL (ref 0.20–1.20)
BUN: 12 mg/dL (ref 7.0–26.0)
CALCIUM: 10.3 mg/dL (ref 8.4–10.4)
CO2: 25 mEq/L (ref 22–29)
Chloride: 107 mEq/L (ref 98–109)
Creatinine: 1 mg/dL (ref 0.7–1.3)
EGFR: 77 mL/min/{1.73_m2} — ABNORMAL LOW (ref >90)
Glucose: 97 mg/dl (ref 70–140)
Potassium: 4.3 mEq/L (ref 3.5–5.1)
Sodium: 140 mEq/L (ref 136–145)
TOTAL PROTEIN: 6.5 g/dL (ref 6.4–8.3)

## 2016-12-20 LAB — CBC WITH DIFFERENTIAL/PLATELET
BASO%: 2.4 % — ABNORMAL HIGH (ref 0.0–2.0)
Basophils Absolute: 0.1 10*3/uL (ref 0.0–0.1)
EOS%: 11 % — ABNORMAL HIGH (ref 0.0–7.0)
Eosinophils Absolute: 0.5 10*3/uL (ref 0.0–0.5)
HCT: 35.5 % — ABNORMAL LOW (ref 38.4–49.9)
HGB: 11.9 g/dL — ABNORMAL LOW (ref 13.0–17.1)
LYMPH%: 26.2 % (ref 14.0–49.0)
MCH: 29.6 pg (ref 27.2–33.4)
MCHC: 33.5 g/dL (ref 32.0–36.0)
MCV: 88.4 fL (ref 79.3–98.0)
MONO#: 0.4 10*3/uL (ref 0.1–0.9)
MONO%: 8.3 % (ref 0.0–14.0)
NEUT#: 2.4 10*3/uL (ref 1.5–6.5)
NEUT%: 52.1 % (ref 39.0–75.0)
Platelets: 175 10*3/uL (ref 140–400)
RBC: 4.02 10*6/uL — ABNORMAL LOW (ref 4.20–5.82)
RDW: 13.6 % (ref 11.0–14.6)
WBC: 4.5 10*3/uL (ref 4.0–10.3)
lymph#: 1.2 10*3/uL (ref 0.9–3.3)

## 2016-12-20 LAB — UA PROTEIN, DIPSTICK - CHCC: Protein, ur: NEGATIVE mg/dL

## 2016-12-20 MED ORDER — DEXAMETHASONE SODIUM PHOSPHATE 10 MG/ML IJ SOLN
10.0000 mg | Freq: Once | INTRAMUSCULAR | Status: AC
  Administered 2016-12-20: 10 mg via INTRAVENOUS

## 2016-12-20 MED ORDER — FLUOROURACIL CHEMO INJECTION 2.5 GM/50ML
400.0000 mg/m2 | Freq: Once | INTRAVENOUS | Status: AC
  Administered 2016-12-20: 900 mg via INTRAVENOUS
  Filled 2016-12-20: qty 18

## 2016-12-20 MED ORDER — PALONOSETRON HCL INJECTION 0.25 MG/5ML
INTRAVENOUS | Status: AC
  Filled 2016-12-20: qty 5

## 2016-12-20 MED ORDER — LEUCOVORIN CALCIUM INJECTION 350 MG
400.0000 mg/m2 | Freq: Once | INTRAVENOUS | Status: AC
  Administered 2016-12-20: 892 mg via INTRAVENOUS
  Filled 2016-12-20: qty 44.6

## 2016-12-20 MED ORDER — SODIUM CHLORIDE 0.9 % IV SOLN: 5.0000 mg/kg | Freq: Once | INTRAVENOUS | Status: DC

## 2016-12-20 MED ORDER — PALONOSETRON HCL INJECTION 0.25 MG/5ML
0.2500 mg | Freq: Once | INTRAVENOUS | Status: AC
Start: 2016-12-20 — End: 2016-12-20
  Administered 2016-12-20: 0.25 mg via INTRAVENOUS

## 2016-12-20 MED ORDER — DEXTROSE 5 % IV SOLN
Freq: Once | INTRAVENOUS | Status: AC
  Administered 2016-12-20: 13:00:00 via INTRAVENOUS

## 2016-12-20 MED ORDER — SODIUM CHLORIDE 0.9% FLUSH
10.0000 mL | Freq: Once | INTRAVENOUS | Status: AC
  Administered 2016-12-20: 10 mL via INTRAVENOUS
  Filled 2016-12-20: qty 10

## 2016-12-20 MED ORDER — DEXAMETHASONE SODIUM PHOSPHATE 10 MG/ML IJ SOLN
INTRAMUSCULAR | Status: AC
  Filled 2016-12-20: qty 1

## 2016-12-20 MED ORDER — OXALIPLATIN CHEMO INJECTION 100 MG/20ML
89.0000 mg/m2 | Freq: Once | INTRAVENOUS | Status: AC
  Administered 2016-12-20: 200 mg via INTRAVENOUS
  Filled 2016-12-20: qty 40

## 2016-12-20 MED ORDER — SODIUM CHLORIDE 0.9 % IV SOLN
2400.0000 mg/m2 | INTRAVENOUS | Status: DC
  Administered 2016-12-20: 5350 mg via INTRAVENOUS
  Filled 2016-12-20: qty 107

## 2016-12-20 NOTE — Patient Instructions (Signed)
Implanted Port Home Guide An implanted port is a type of central line that is placed under the skin. Central lines are used to provide IV access when treatment or nutrition needs to be given through a person's veins. Implanted ports are used for long-term IV access. An implanted port may be placed because:  You need IV medicine that would be irritating to the small veins in your hands or arms.  You need long-term IV medicines, such as antibiotics.  You need IV nutrition for a long period.  You need frequent blood draws for lab tests.  You need dialysis.  Implanted ports are usually placed in the chest area, but they can also be placed in the upper arm, the abdomen, or the leg. An implanted port has two main parts:  Reservoir. The reservoir is round and will appear as a small, raised area under your skin. The reservoir is the part where a needle is inserted to give medicines or draw blood.  Catheter. The catheter is a thin, flexible tube that extends from the reservoir. The catheter is placed into a large vein. Medicine that is inserted into the reservoir goes into the catheter and then into the vein.  How will I care for my incision site? Do not get the incision site wet. Bathe or shower as directed by your health care provider. How is my port accessed? Special steps must be taken to access the port:  Before the port is accessed, a numbing cream can be placed on the skin. This helps numb the skin over the port site.  Your health care provider uses a sterile technique to access the port. ? Your health care provider must put on a mask and sterile gloves. ? The skin over your port is cleaned carefully with an antiseptic and allowed to dry. ? The port is gently pinched between sterile gloves, and a needle is inserted into the port.  Only "non-coring" port needles should be used to access the port. Once the port is accessed, a blood return should be checked. This helps ensure that the port  is in the vein and is not clogged.  If your port needs to remain accessed for a constant infusion, a clear (transparent) bandage will be placed over the needle site. The bandage and needle will need to be changed every week, or as directed by your health care provider.  Keep the bandage covering the needle clean and dry. Do not get it wet. Follow your health care provider's instructions on how to take a shower or bath while the port is accessed.  If your port does not need to stay accessed, no bandage is needed over the port.  What is flushing? Flushing helps keep the port from getting clogged. Follow your health care provider's instructions on how and when to flush the port. Ports are usually flushed with saline solution or a medicine called heparin. The need for flushing will depend on how the port is used.  If the port is used for intermittent medicines or blood draws, the port will need to be flushed: ? After medicines have been given. ? After blood has been drawn. ? As part of routine maintenance.  If a constant infusion is running, the port may not need to be flushed.  How long will my port stay implanted? The port can stay in for as long as your health care provider thinks it is needed. When it is time for the port to come out, surgery will be   done to remove it. The procedure is similar to the one performed when the port was put in. When should I seek immediate medical care? When you have an implanted port, you should seek immediate medical care if:  You notice a bad smell coming from the incision site.  You have swelling, redness, or drainage at the incision site.  You have more swelling or pain at the port site or the surrounding area.  You have a fever that is not controlled with medicine.  This information is not intended to replace advice given to you by your health care provider. Make sure you discuss any questions you have with your health care provider. Document  Released: 05/29/2005 Document Revised: 11/04/2015 Document Reviewed: 02/03/2013 Elsevier Interactive Patient Education  2017 Elsevier Inc.  

## 2016-12-20 NOTE — Progress Notes (Addendum)
  [place] OFFICE PROGRESS NOTE   Diagnosis:  Metastatic rectal cancer  INTERVAL HISTORY:   Mr. Conly returns as scheduled. He completed cycle 1 FOLFOX 12/06/2016. He denies nausea/vomiting. No mouth sores. No diarrhea. He resumed cold after about 5 days with no significant problems.  Objective:  Vital signs in last 24 hours: Temperature 97.7, heart rate 68, respirations 18, blood pressure 131/73, weight 202.6 pounds     HEENT: No thrush or ulcers. Lymphatics: Small left scalene/supra clavicular lymph node. Resp: Lungs clear bilaterally. Cardio: Regular rate and rhythm. GI: No hepatomegaly. Vascular: Trace edema right lower leg. Skin: No rash. Port-A-Cath without erythema    Lab Results:  Lab Results  Component Value Date   WBC 4.5 12/20/2016   HGB 11.9 (L) 12/20/2016   HCT 35.5 (L) 12/20/2016   MCV 88.4 12/20/2016   PLT 175 12/20/2016   NEUTROABS 2.4 12/20/2016    Imaging:  No results found.  Medications: I have reviewed the patient's current medications.  Assessment/Plan: 1. Rectal cancer  MRI lumbar spine 10/20/2016 with bilateral hydronephrosis and retroperitoneal adenopathy  CT abdomen/pelvis 10/20/2016 with extensive retroperitoneal process with marked interstitial thickening and lymphadenopathy; bilateral hydronephrosis; similar ill-defined soft tissue density and skin thickening around the umbilicus; diffuse bladder wall thickening slightly asymmetric at the bladder dome but no obvious bladder mass  Biopsy para-aortic lymph node 11/10/2016-metastatic adenocarcinoma consistent with GI primary  11/23/2016 CEA elevated, CA-19-9 normal  Colonoscopy 11/27/2016-fungating infiltrative nonobstructing mass in the distal rectum involving the anal verge. The rectum was fixed and frozen in the pelvis precluding passage of scope beyond the distal sigmoid colon. 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  Upper endoscopy  11/27/2016-normal.  PET scan 12/01/2016 showed minimal activity in the primary rectal carcinoma, diffuse wall thickening; mild activity associate with retroperitoneal lymph nodes; persistent haziness within the retroperitoneum. No evidence of liver metastasis or distant metastasis.  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  Cycle 2 FOLFOX 12/20/2016 2. Retroperitoneal lymphadenopathy secondary to #1 3. Bilateral hydronephrosis status post evaluation by urology 4. Asymmetry of the bladder dome on CT 10/20/2016 status post cystoscopy with biopsy planned 12/11/2016 (Dr. Karsten Ro) biopsy of the posterior bladder wall confirmed metastatic carcinoma consistent with metastatic signet ring cell carcinoma 5. Change in bowel habits, secondary to #1 6. Right leg edema, question due to lymphatic obstruction; venous Doppler negative for DVT 11/23/2016. 7. Port-A-Cath placement scheduled 12/05/2016   Disposition: Mr. Choi appears stable. He has completed 1 cycle of FOLFOX. Overall he seems to have tolerated it well. Plan to proceed with cycle 2 today as scheduled.  We reviewed the bladder wall and left supraclavicular lymph node biopsy results. He understands both showed metastatic signet ring cell carcinoma.  Dr. Benay Spice discussed adding Avastin but decided against this due to the bladder involvement.  Mr. Corporan will return for a follow-up visit and cycle 3 FOLFOX in 2 weeks. He will contact the office in the interim with any problems.  Patient seen with Dr. Benay Spice.    Ned Card ANP/GNP-BC   12/20/2016  12:06 PM  This was a shared visit with Ned Card. Mr. Henery tolerated the first cycle of FOLFOX well. Biopsies of the bladder and a left supraclavicular node are consistent with  metastatic rectal cancer. He will continue FOLFOX chemotherapy.  Julieanne Manson, M.D.

## 2016-12-20 NOTE — Patient Instructions (Signed)
[facility] Discharge Instructions for Patients Receiving Chemotherapy  Today you received the following chemotherapy agents Oxaliplatin, Leucovorin, 5FU  To help prevent nausea and vomiting after your treatment, we encourage you to take your nausea medication as directed.    If you develop nausea and vomiting that is not controlled by your nausea medication, call the clinic.   BELOW ARE SYMPTOMS THAT SHOULD BE REPORTED IMMEDIATELY:  *FEVER GREATER THAN 100.5 F  *CHILLS WITH OR WITHOUT FEVER  NAUSEA AND VOMITING THAT IS NOT CONTROLLED WITH YOUR NAUSEA MEDICATION  *UNUSUAL SHORTNESS OF BREATH  *UNUSUAL BRUISING OR BLEEDING  TENDERNESS IN MOUTH AND THROAT WITH OR WITHOUT PRESENCE OF ULCERS  *URINARY PROBLEMS  *BOWEL PROBLEMS  UNUSUAL RASH Items with * indicate a potential emergency and should be followed up as soon as possible.  Feel free to call the clinic you have any questions or concerns. The clinic phone number is [phone].  Please show the CHEMO ALERT CARD at check-in to the Emergency Department and triage nurse.   

## 2016-12-21 ENCOUNTER — Telehealth: Payer: Self-pay | Admitting: Oncology

## 2016-12-21 NOTE — Telephone Encounter (Signed)
S/w pt, gave appt for 7/25 @ 10.45am, advised to collect a calendar then.

## 2016-12-22 ENCOUNTER — Ambulatory Visit (HOSPITAL_BASED_OUTPATIENT_CLINIC_OR_DEPARTMENT_OTHER): Payer: Federal, State, Local not specified - PPO

## 2016-12-22 DIAGNOSIS — Z452 Encounter for adjustment and management of vascular access device: Secondary | ICD-10-CM

## 2016-12-22 DIAGNOSIS — C20 Malignant neoplasm of rectum: Secondary | ICD-10-CM | POA: Diagnosis not present

## 2016-12-22 DIAGNOSIS — Z95828 Presence of other vascular implants and grafts: Secondary | ICD-10-CM

## 2016-12-22 MED ORDER — SODIUM CHLORIDE 0.9% FLUSH
10.0000 mL | INTRAVENOUS | Status: DC | PRN
  Administered 2016-12-22: 10 mL via INTRAVENOUS
  Filled 2016-12-22: qty 10

## 2016-12-22 MED ORDER — HEPARIN SOD (PORK) LOCK FLUSH 100 UNIT/ML IV SOLN
500.0000 [IU] | Freq: Once | INTRAVENOUS | Status: AC
  Administered 2016-12-22: 500 [IU] via INTRAVENOUS
  Filled 2016-12-22: qty 5

## 2016-12-22 NOTE — Progress Notes (Signed)
Pt in for pump disconnect, reports B calf "soreness." Feels "tight" like he "ran a marathon." Denies pain, warmth or swelling. (RLE edema improving per pt.) Reviewed with Lattie Haw, NP: Push PO fluids, call office for edema, erythema or increased pain. Pt voiced understanding.

## 2016-12-22 NOTE — Patient Instructions (Signed)

## 2016-12-26 ENCOUNTER — Encounter (HOSPITAL_COMMUNITY): Payer: Self-pay

## 2016-12-31 ENCOUNTER — Other Ambulatory Visit: Payer: Self-pay | Admitting: Oncology

## 2017-01-03 ENCOUNTER — Ambulatory Visit: Payer: Federal, State, Local not specified - PPO

## 2017-01-03 ENCOUNTER — Other Ambulatory Visit (HOSPITAL_BASED_OUTPATIENT_CLINIC_OR_DEPARTMENT_OTHER): Payer: Federal, State, Local not specified - PPO

## 2017-01-03 ENCOUNTER — Ambulatory Visit (HOSPITAL_BASED_OUTPATIENT_CLINIC_OR_DEPARTMENT_OTHER): Payer: Federal, State, Local not specified - PPO

## 2017-01-03 ENCOUNTER — Other Ambulatory Visit: Payer: Federal, State, Local not specified - PPO

## 2017-01-03 ENCOUNTER — Ambulatory Visit (HOSPITAL_BASED_OUTPATIENT_CLINIC_OR_DEPARTMENT_OTHER): Payer: Federal, State, Local not specified - PPO | Admitting: Oncology

## 2017-01-03 VITALS — BP 119/77 | HR 61 | Temp 97.9°F | Resp 18 | Ht 75.0 in | Wt 203.2 lb

## 2017-01-03 DIAGNOSIS — C674 Malignant neoplasm of posterior wall of bladder: Secondary | ICD-10-CM

## 2017-01-03 DIAGNOSIS — C770 Secondary and unspecified malignant neoplasm of lymph nodes of head, face and neck: Secondary | ICD-10-CM

## 2017-01-03 DIAGNOSIS — Z5111 Encounter for antineoplastic chemotherapy: Secondary | ICD-10-CM | POA: Diagnosis not present

## 2017-01-03 DIAGNOSIS — C20 Malignant neoplasm of rectum: Secondary | ICD-10-CM

## 2017-01-03 DIAGNOSIS — D6959 Other secondary thrombocytopenia: Secondary | ICD-10-CM | POA: Diagnosis not present

## 2017-01-03 DIAGNOSIS — N1330 Unspecified hydronephrosis: Secondary | ICD-10-CM

## 2017-01-03 DIAGNOSIS — R194 Change in bowel habit: Secondary | ICD-10-CM

## 2017-01-03 LAB — COMPREHENSIVE METABOLIC PANEL
ALT: 42 U/L (ref 0–55)
AST: 37 U/L — AB (ref 5–34)
Albumin: 3.4 g/dL — ABNORMAL LOW (ref 3.5–5.0)
Alkaline Phosphatase: 88 U/L (ref 40–150)
Anion Gap: 6 mEq/L (ref 3–11)
BILIRUBIN TOTAL: 0.53 mg/dL (ref 0.20–1.20)
BUN: 11.6 mg/dL (ref 7.0–26.0)
CO2: 26 meq/L (ref 22–29)
CREATININE: 1 mg/dL (ref 0.7–1.3)
Calcium: 10.2 mg/dL (ref 8.4–10.4)
Chloride: 108 mEq/L (ref 98–109)
EGFR: 82 mL/min/{1.73_m2} — ABNORMAL LOW (ref >90)
GLUCOSE: 100 mg/dL (ref 70–140)
Potassium: 4.2 mEq/L (ref 3.5–5.1)
SODIUM: 140 meq/L (ref 136–145)
TOTAL PROTEIN: 6.1 g/dL — AB (ref 6.4–8.3)

## 2017-01-03 LAB — CBC WITH DIFFERENTIAL/PLATELET
BASO%: 1.2 % (ref 0.0–2.0)
Basophils Absolute: 0 10*3/uL (ref 0.0–0.1)
EOS ABS: 0.3 10*3/uL (ref 0.0–0.5)
EOS%: 9.8 % — AB (ref 0.0–7.0)
HCT: 34.8 % — ABNORMAL LOW (ref 38.4–49.9)
HGB: 11.7 g/dL — ABNORMAL LOW (ref 13.0–17.1)
LYMPH%: 31.4 % (ref 14.0–49.0)
MCH: 30.1 pg (ref 27.2–33.4)
MCHC: 33.6 g/dL (ref 32.0–36.0)
MCV: 89.5 fL (ref 79.3–98.0)
MONO#: 0.3 10*3/uL (ref 0.1–0.9)
MONO%: 8.6 % (ref 0.0–14.0)
NEUT%: 49 % (ref 39.0–75.0)
NEUTROS ABS: 1.7 10*3/uL (ref 1.5–6.5)
NRBC: 0 % (ref 0–0)
Platelets: 86 10*3/uL — ABNORMAL LOW (ref 140–400)
RBC: 3.89 10*6/uL — AB (ref 4.20–5.82)
RDW: 15.9 % — AB (ref 11.0–14.6)
WBC: 3.5 10*3/uL — AB (ref 4.0–10.3)
lymph#: 1.1 10*3/uL (ref 0.9–3.3)

## 2017-01-03 MED ORDER — OXALIPLATIN CHEMO INJECTION 100 MG/20ML
67.0000 mg/m2 | Freq: Once | INTRAVENOUS | Status: AC
  Administered 2017-01-03: 150 mg via INTRAVENOUS
  Filled 2017-01-03: qty 20

## 2017-01-03 MED ORDER — LEUCOVORIN CALCIUM INJECTION 350 MG
400.0000 mg/m2 | Freq: Once | INTRAMUSCULAR | Status: AC
  Administered 2017-01-03: 892 mg via INTRAVENOUS
  Filled 2017-01-03: qty 44.6

## 2017-01-03 MED ORDER — FLUOROURACIL CHEMO INJECTION 2.5 GM/50ML
400.0000 mg/m2 | Freq: Once | INTRAVENOUS | Status: AC
  Administered 2017-01-03: 900 mg via INTRAVENOUS
  Filled 2017-01-03: qty 18

## 2017-01-03 MED ORDER — DEXTROSE 5 % IV SOLN
Freq: Once | INTRAVENOUS | Status: AC
  Administered 2017-01-03: 14:00:00 via INTRAVENOUS

## 2017-01-03 MED ORDER — SODIUM CHLORIDE 0.9 % IV SOLN
2400.0000 mg/m2 | INTRAVENOUS | Status: DC
  Administered 2017-01-03: 5350 mg via INTRAVENOUS
  Filled 2017-01-03: qty 107

## 2017-01-03 MED ORDER — DEXAMETHASONE SODIUM PHOSPHATE 10 MG/ML IJ SOLN
INTRAMUSCULAR | Status: AC
  Filled 2017-01-03: qty 1

## 2017-01-03 MED ORDER — PALONOSETRON HCL INJECTION 0.25 MG/5ML
0.2500 mg | Freq: Once | INTRAVENOUS | Status: AC
Start: 2017-01-03 — End: 2017-01-03
  Administered 2017-01-03: 0.25 mg via INTRAVENOUS

## 2017-01-03 MED ORDER — DEXAMETHASONE SODIUM PHOSPHATE 10 MG/ML IJ SOLN
10.0000 mg | Freq: Once | INTRAMUSCULAR | Status: AC
  Administered 2017-01-03: 10 mg via INTRAVENOUS

## 2017-01-03 MED ORDER — PALONOSETRON HCL INJECTION 0.25 MG/5ML
INTRAVENOUS | Status: AC
  Filled 2017-01-03: qty 5

## 2017-01-03 NOTE — Patient Instructions (Signed)
[facility] Discharge Instructions for Patients Receiving Chemotherapy  Today you received the following chemotherapy agents: Oxaliplatin, Leucovorin and Adrucil.  To help prevent nausea and vomiting after your treatment, we encourage you to take your nausea medication as directed. No Zofran for 3 days. Take Compazine instead.    If you develop nausea and vomiting that is not controlled by your nausea medication, call the clinic.   BELOW ARE SYMPTOMS THAT SHOULD BE REPORTED IMMEDIATELY:  *FEVER GREATER THAN 100.5 F  *CHILLS WITH OR WITHOUT FEVER  NAUSEA AND VOMITING THAT IS NOT CONTROLLED WITH YOUR NAUSEA MEDICATION  *UNUSUAL SHORTNESS OF BREATH  *UNUSUAL BRUISING OR BLEEDING  TENDERNESS IN MOUTH AND THROAT WITH OR WITHOUT PRESENCE OF ULCERS  *URINARY PROBLEMS  *BOWEL PROBLEMS  UNUSUAL RASH Items with * indicate a potential emergency and should be followed up as soon as possible.  Feel free to call the clinic you have any questions or concerns. The clinic phone number is [phone].  Please show the CHEMO ALERT CARD at check-in to the Emergency Department and triage nurse.   

## 2017-01-03 NOTE — Progress Notes (Signed)
Per Dr. Benay Spice: OK to treat with PLT 86k. Oxaliplatin dose reduced.

## 2017-01-03 NOTE — Progress Notes (Signed)
Nicholas Suarez OFFICE PROGRESS NOTE   Diagnosis: Rectal cancer  INTERVAL HISTORY:   Nicholas Suarez completed another cycle of FOLFOX on 12/20/2016. No nausea/vomiting or mouth sores following chemotherapy. Cold sensitivity lasted for 5 days. No neuropathy symptoms today. He has loose stools but no frank diarrhea. He believes the right leg is slightly more swollen. He has been working at home.  Objective:  Vital signs in last 24 hours:  Blood pressure 119/77, pulse 61, temperature 97.9 F (36.6 C), temperature source Oral, resp. rate 18, height 6\' 3"  (1.905 m), weight 203 lb 3.2 oz (92.2 kg), SpO2 99 %.    HEENT: No thrush or ulcers Lymphatics: Firm fullness in the left supraclavicular fossa without a discrete lymph node Resp: Lungs clear bilaterally Cardio: Regular rate and rhythm GI: No hepatomegaly, nontender, no mass Vascular: Trace edema throughout the right leg    Portacath/PICC-without erythema  Lab Results:  Lab Results  Component Value Date   WBC 3.5 (L) 01/03/2017   HGB 11.7 (L) 01/03/2017   HCT 34.8 (L) 01/03/2017   MCV 89.5 01/03/2017   PLT 86 (L) 01/03/2017   NEUTROABS 1.7 01/03/2017    CMP     Component Value Date/Time   NA 140 01/03/2017 1032   K 4.2 01/03/2017 1032   CL 107 11/01/2016 0949   CO2 26 01/03/2017 1032   GLUCOSE 100 01/03/2017 1032   BUN 11.6 01/03/2017 1032   CREATININE 1.0 01/03/2017 1032   CALCIUM 10.2 01/03/2017 1032   PROT 6.1 (L) 01/03/2017 1032   ALBUMIN 3.4 (L) 01/03/2017 1032   AST 37 (H) 01/03/2017 1032   ALT 42 01/03/2017 1032   ALKPHOS 88 01/03/2017 1032   BILITOT 0.53 01/03/2017 1032    Lab Results  Component Value Date   CEA1 699.35 (H) 12/05/2016     Medications: I have reviewed the patient's current medications.  Assessment/Plan: 1. Rectal cancer  MRI lumbar spine 10/20/2016 with bilateral hydronephrosis and retroperitoneal adenopathy  CT abdomen/pelvis 10/20/2016 with extensive  retroperitoneal process with marked interstitial thickening and lymphadenopathy; bilateral hydronephrosis; similar ill-defined soft tissue density and skin thickening around the umbilicus; diffuse bladder wall thickening slightly asymmetric at the bladder dome but no obvious bladder mass  Biopsy para-aortic lymph node 11/10/2016-metastatic adenocarcinoma consistent with GI primary  11/23/2016 CEA elevated, CA-19-9 normal  Colonoscopy 11/27/2016-fungating infiltrative nonobstructing mass in the distal rectum involving the anal verge. The rectum was fixed and frozen in the pelvis precluding passage of scope beyond the distal sigmoid colon. 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  Upper endoscopy 11/27/2016-normal.  PET scan 12/01/2016 showed minimal activity in the primary rectal carcinoma, diffuse wall thickening; mild activity associate with retroperitoneal lymph nodes; persistent haziness within the retroperitoneum. No evidence of liver metastasis or distant metastasis.  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  Cycle 2 FOLFOX 12/20/2016  Cycle 3 FOLFOX 01/03/2017 (oxaliplatin dose reduced secondary to thrombocytopenia)  2. Retroperitoneal lymphadenopathy secondary to #1 3. Bilateral hydronephrosis status post evaluation by urology 4. Asymmetry of the bladder dome on CT 10/20/2016 status post cystoscopy with biopsy planned 12/11/2016 (Dr. Karsten Ro) biopsy of the posterior bladder wall confirmed metastatic carcinoma consistent with metastatic signet ring cell carcinoma 5. Change in bowel habits, secondary to #1 6. Right leg edema, question due to lymphatic obstruction;venous Doppler negative for DVT 11/23/2016. 7. Port-A-Cath placement scheduled 12/05/2016 8.  Thrombocytopenia  secondary to chemotherapy  Disposition:  Nicholas Suarez has competed 2 cycles of FOLFOX. He has tolerated the chemotherapy well today. He has developed mild thrombocytopenia secondary to chemotherapy. The oxaliplatin will be dose reduced with chemotherapy today. He understands the risk of developing more significant thrombocytopenia with the chance of bleeding. He will contact us for bleeding or bruising. Nicholas Suarez will return for an office visit and chemotherapy in 2 weeks.  The plan is to obtain a restaging CT evaluation after cycle 5. We will check a CEA when he returns in 2 weeks.  Donneta Romberg, MD  01/03/2017  2:08 PM

## 2017-01-05 ENCOUNTER — Ambulatory Visit (HOSPITAL_BASED_OUTPATIENT_CLINIC_OR_DEPARTMENT_OTHER): Payer: Federal, State, Local not specified - PPO

## 2017-01-05 VITALS — BP 102/64 | HR 88 | Temp 97.7°F | Resp 18

## 2017-01-05 DIAGNOSIS — C770 Secondary and unspecified malignant neoplasm of lymph nodes of head, face and neck: Secondary | ICD-10-CM

## 2017-01-05 DIAGNOSIS — Z452 Encounter for adjustment and management of vascular access device: Secondary | ICD-10-CM | POA: Diagnosis not present

## 2017-01-05 DIAGNOSIS — C20 Malignant neoplasm of rectum: Secondary | ICD-10-CM

## 2017-01-05 DIAGNOSIS — C674 Malignant neoplasm of posterior wall of bladder: Secondary | ICD-10-CM | POA: Diagnosis not present

## 2017-01-05 MED ORDER — SODIUM CHLORIDE 0.9% FLUSH
10.0000 mL | INTRAVENOUS | Status: DC | PRN
  Administered 2017-01-05: 10 mL
  Filled 2017-01-05: qty 10

## 2017-01-05 MED ORDER — HEPARIN SOD (PORK) LOCK FLUSH 100 UNIT/ML IV SOLN
500.0000 [IU] | Freq: Once | INTRAVENOUS | Status: AC | PRN
  Administered 2017-01-05: 500 [IU]
  Filled 2017-01-05: qty 5

## 2017-01-14 ENCOUNTER — Encounter: Payer: Self-pay | Admitting: Oncology

## 2017-01-17 ENCOUNTER — Ambulatory Visit: Payer: Federal, State, Local not specified - PPO

## 2017-01-17 ENCOUNTER — Ambulatory Visit: Payer: Federal, State, Local not specified - PPO | Admitting: Nutrition

## 2017-01-17 ENCOUNTER — Telehealth: Payer: Self-pay | Admitting: Oncology

## 2017-01-17 ENCOUNTER — Ambulatory Visit (HOSPITAL_BASED_OUTPATIENT_CLINIC_OR_DEPARTMENT_OTHER): Payer: Federal, State, Local not specified - PPO

## 2017-01-17 ENCOUNTER — Ambulatory Visit (HOSPITAL_BASED_OUTPATIENT_CLINIC_OR_DEPARTMENT_OTHER): Payer: Federal, State, Local not specified - PPO | Admitting: Nurse Practitioner

## 2017-01-17 ENCOUNTER — Other Ambulatory Visit (HOSPITAL_BASED_OUTPATIENT_CLINIC_OR_DEPARTMENT_OTHER): Payer: Federal, State, Local not specified - PPO

## 2017-01-17 VITALS — BP 110/73 | HR 64 | Temp 98.4°F | Resp 18 | Ht 75.0 in | Wt 201.3 lb

## 2017-01-17 DIAGNOSIS — C20 Malignant neoplasm of rectum: Secondary | ICD-10-CM

## 2017-01-17 DIAGNOSIS — C770 Secondary and unspecified malignant neoplasm of lymph nodes of head, face and neck: Secondary | ICD-10-CM | POA: Diagnosis not present

## 2017-01-17 DIAGNOSIS — D701 Agranulocytosis secondary to cancer chemotherapy: Secondary | ICD-10-CM

## 2017-01-17 DIAGNOSIS — Z95828 Presence of other vascular implants and grafts: Secondary | ICD-10-CM

## 2017-01-17 DIAGNOSIS — D6959 Other secondary thrombocytopenia: Secondary | ICD-10-CM | POA: Diagnosis not present

## 2017-01-17 DIAGNOSIS — C674 Malignant neoplasm of posterior wall of bladder: Secondary | ICD-10-CM

## 2017-01-17 DIAGNOSIS — R609 Edema, unspecified: Secondary | ICD-10-CM

## 2017-01-17 LAB — COMPREHENSIVE METABOLIC PANEL
ALT: 44 U/L (ref 0–55)
AST: 41 U/L — AB (ref 5–34)
Albumin: 3.4 g/dL — ABNORMAL LOW (ref 3.5–5.0)
Alkaline Phosphatase: 76 U/L (ref 40–150)
Anion Gap: 6 mEq/L (ref 3–11)
BUN: 14.5 mg/dL (ref 7.0–26.0)
CALCIUM: 10.1 mg/dL (ref 8.4–10.4)
CHLORIDE: 107 meq/L (ref 98–109)
CO2: 25 meq/L (ref 22–29)
Creatinine: 1 mg/dL (ref 0.7–1.3)
EGFR: 83 mL/min/{1.73_m2} — ABNORMAL LOW (ref >90)
GLUCOSE: 103 mg/dL (ref 70–140)
POTASSIUM: 4.3 meq/L (ref 3.5–5.1)
SODIUM: 138 meq/L (ref 136–145)
Total Bilirubin: 0.68 mg/dL (ref 0.20–1.20)
Total Protein: 6 g/dL — ABNORMAL LOW (ref 6.4–8.3)

## 2017-01-17 LAB — CBC WITH DIFFERENTIAL/PLATELET
BASO%: 1.3 % (ref 0.0–2.0)
BASOS ABS: 0 10*3/uL (ref 0.0–0.1)
EOS%: 10.3 % — AB (ref 0.0–7.0)
Eosinophils Absolute: 0.3 10*3/uL (ref 0.0–0.5)
HCT: 34.8 % — ABNORMAL LOW (ref 38.4–49.9)
HGB: 11.6 g/dL — ABNORMAL LOW (ref 13.0–17.1)
LYMPH%: 33.7 % (ref 14.0–49.0)
MCH: 30.5 pg (ref 27.2–33.4)
MCHC: 33.3 g/dL (ref 32.0–36.0)
MCV: 91.6 fL (ref 79.3–98.0)
MONO#: 0.3 10*3/uL (ref 0.1–0.9)
MONO%: 10.9 % (ref 0.0–14.0)
NEUT#: 1.4 10*3/uL — ABNORMAL LOW (ref 1.5–6.5)
NEUT%: 43.8 % (ref 39.0–75.0)
Platelets: 68 10*3/uL — ABNORMAL LOW (ref 140–400)
RBC: 3.8 10*6/uL — AB (ref 4.20–5.82)
RDW: 17.5 % — ABNORMAL HIGH (ref 11.0–14.6)
WBC: 3.1 10*3/uL — ABNORMAL LOW (ref 4.0–10.3)
lymph#: 1.1 10*3/uL (ref 0.9–3.3)

## 2017-01-17 LAB — CEA (IN HOUSE-CHCC): CEA (CHCC-IN HOUSE): 47.88 ng/mL — AB (ref 0.00–5.00)

## 2017-01-17 MED ORDER — LEUCOVORIN CALCIUM INJECTION 350 MG
400.0000 mg/m2 | Freq: Once | INTRAVENOUS | Status: AC
  Administered 2017-01-17: 892 mg via INTRAVENOUS
  Filled 2017-01-17: qty 44.6

## 2017-01-17 MED ORDER — DEXTROSE 5 % IV SOLN
Freq: Once | INTRAVENOUS | Status: AC
  Administered 2017-01-17: 13:00:00 via INTRAVENOUS

## 2017-01-17 MED ORDER — SODIUM CHLORIDE 0.9% FLUSH
10.0000 mL | Freq: Once | INTRAVENOUS | Status: AC
  Administered 2017-01-17: 10 mL
  Filled 2017-01-17: qty 10

## 2017-01-17 MED ORDER — SODIUM CHLORIDE 0.9 % IV SOLN
2400.0000 mg/m2 | INTRAVENOUS | Status: DC
  Filled 2017-01-17: qty 107

## 2017-01-17 MED ORDER — FLUOROURACIL CHEMO INJECTION 2.5 GM/50ML
400.0000 mg/m2 | Freq: Once | INTRAVENOUS | Status: DC
  Filled 2017-01-17: qty 18

## 2017-01-17 NOTE — Progress Notes (Signed)
Platelets 68  Per Ned Card NP ok to tx today with 5FU and leucovorin/ No oxaliplatin & no pre meds

## 2017-01-17 NOTE — Progress Notes (Signed)
Brief nutrition visit with patient who is receiving treatment for rectal cancer. Patient reports he is eating well.  He wonders what foods he should be eating or not eating during therapy. He has lost approximately 12 pounds since June. Provided brief education on high-calorie, high-protein diet for weight maintenance. Provided fact sheets and contact information.  **Disclaimer: This note was dictated with voice recognition software. Similar sounding words can inadvertently be transcribed and this note may contain transcription errors which may not have been corrected upon publication of note.**

## 2017-01-17 NOTE — Patient Instructions (Signed)
[facility] Discharge Instructions for Patients Receiving Chemotherapy  Today you received the following chemotherapy agents, Leucovorin and Adrucil   To help prevent nausea and vomiting after your treatment, we encourage you to take your nausea medication as directed. No Zofran for 3 days. Take Compazine instead.   If you develop nausea and vomiting that is not controlled by your nausea medication, call the clinic.   BELOW ARE SYMPTOMS THAT SHOULD BE REPORTED IMMEDIATELY:  *FEVER GREATER THAN 100.5 F  *CHILLS WITH OR WITHOUT FEVER  NAUSEA AND VOMITING THAT IS NOT CONTROLLED WITH YOUR NAUSEA MEDICATION  *UNUSUAL SHORTNESS OF BREATH  *UNUSUAL BRUISING OR BLEEDING  TENDERNESS IN MOUTH AND THROAT WITH OR WITHOUT PRESENCE OF ULCERS  *URINARY PROBLEMS  *BOWEL PROBLEMS  UNUSUAL RASH Items with * indicate a potential emergency and should be followed up as soon as possible.  Feel free to call the clinic you have any questions or concerns. The clinic phone number is [phone].  Please show the [place] at check-in to the Emergency Department and triage nurse.

## 2017-01-17 NOTE — Progress Notes (Addendum)
Nicholas Suarez OFFICE PROGRESS NOTE   Diagnosis:  Rectal cancer  INTERVAL HISTORY:   Nicholas Suarez returns as scheduled. He completed cycle 3 FOLFOX 01/03/2017. He denies nausea. No mouth sores. He notes a single sore at the left medial nasal passage. No diarrhea. Bowels overall are moving regularly. Urinary symptoms have largely resolved. No significant cold sensitivity following treatment. No persistent neuropathy symptoms.  Objective:  Vital signs in last 24 hours:  Blood pressure 110/73, pulse 64, temperature 98.4 F (36.9 C), temperature source Oral, resp. rate 18, height '6\' 3"'  (1.905 m), weight 201 lb 4.8 oz (91.3 kg), SpO2 100 %.    HEENT: No thrush or ulcers. Left medial nasal passage with a small scabbed lesion. Lymphatics: No discreet left supra clavicular adenopathy. Resp: Lungs clear bilaterally. Cardio: Regular rate and rhythm. GI: No hepatomegaly. Vascular: Trace edema throughout the right leg. Neuro: Vibratory sense mildly decreased over the finger to his per tuning fork exam.  Skin: Lower extremities with small scattered abrasions. Port-A-Cath without erythema.    Lab Results:  Lab Results  Component Value Date   WBC 3.1 (L) 01/17/2017   HGB 11.6 (L) 01/17/2017   HCT 34.8 (L) 01/17/2017   MCV 91.6 01/17/2017   PLT 68 (L) 01/17/2017   NEUTROABS 1.4 (L) 01/17/2017    Imaging:  No results found.  Medications: I have reviewed the patient's current medications.  Assessment/Plan: 1. Rectal cancer  MRI lumbar spine 10/20/2016 with bilateral hydronephrosis and retroperitoneal adenopathy  CT abdomen/pelvis 10/20/2016 with extensive retroperitoneal process with marked interstitial thickening and lymphadenopathy; bilateral hydronephrosis; similar ill-defined soft tissue density and skin thickening around the umbilicus; diffuse bladder wall thickening slightly asymmetric at the bladder dome but no obvious bladder mass  Biopsy para-aortic lymph node  11/10/2016-metastatic adenocarcinoma consistent with GI primary  11/23/2016 CEA elevated, CA-19-9 normal  Colonoscopy 11/27/2016-fungating infiltrative nonobstructing mass in the distal rectum involving the anal verge. The rectum was fixed and frozen in the pelvis precluding passage of scope beyond the distal sigmoid colon. 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  Foundation 1-microsatellite stable; tumor mutational burden 5; NOTCH3 amplification; NOTCH3 rearrangement exon 14  Upper endoscopy 11/27/2016-normal.  PET scan 06/22/2018showed minimal activity in the primary rectal carcinoma, diffuse wall thickening; mild activity associate with retroperitoneal lymph nodes; persistent haziness within the retroperitoneum. No evidence of liver metastasis or distant metastasis.  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  Cycle 2 FOLFOX 12/20/2016  Cycle 3 FOLFOX 01/03/2017 (oxaliplatin dose reduced secondary to thrombocytopenia)   Cycle 4 FOLFOX 01/17/2017 (oxaliplatin held due to thrombocytopenia) 2. Retroperitoneal lymphadenopathy secondary to #1 3. Bilateral hydronephrosis status post evaluation by urology 4. Asymmetry of the bladder dome on CT 10/20/2016 status post cystoscopy with biopsy planned 12/11/2016 (Dr. 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 Change in bowel habits, secondary to #1-improved. 6. Right leg edema, question due to lymphatic obstruction;venous Doppler negative for DVT 11/23/2016. 7. Port-A-Cath placement 12/05/2016 8. Thrombocytopenia secondary to chemotherapy; oxaliplatin dose reduced with cycle 3 FOLFOX. Progressive thrombocytopenia 01/17/2017; oxaliplatin held  01/17/2017.   Disposition: Nicholas Suarez appears stable. He has completed 3 cycles of FOLFOX. Plan to proceed with cycle 4 today as scheduled. We reviewed today's labs. He has progressive thrombocytopenia and mild neutropenia. Oxaliplatin will be held with today's treatment. Neutropenic and thrombocytopenic  precautions reviewed. He understands to contact the office with fever, chills, other signs of infection, spontaneous bruising/bleeding.  The plan is to obtain a restaging CT evaluation after cycle 5.  He will return for a follow-up visit and cycle 5 FOLFOX in 2 weeks. He will contact the office in the interim as outlined above or with any other problems.  Patient seen with Dr. Benay Spice. 25 minutes were spent face-to-face at today's visit with the majority of that time involved in counseling/coordination of care.    Ned Card ANP/GNP-BC   01/17/2017  12:48 PM This was a shared visit with Ned Card. Nicholas Suarez was interviewed and examined. His clinical status has improved with FOLFOX chemotherapy. The right leg edema appears improved. He has thrombocytopenia despite a dose reduction of oxaliplatin. Oxaliplatin will be deleted from the chemotherapy regimen today.  Julieanne Manson, M.D.

## 2017-01-17 NOTE — Telephone Encounter (Signed)
Gave patient avs and calendars.  °

## 2017-01-18 NOTE — Progress Notes (Signed)
EMR downtime occurred during treatment. Supplemental documentation used.

## 2017-01-19 ENCOUNTER — Ambulatory Visit (HOSPITAL_BASED_OUTPATIENT_CLINIC_OR_DEPARTMENT_OTHER): Payer: Federal, State, Local not specified - PPO

## 2017-01-19 DIAGNOSIS — Z452 Encounter for adjustment and management of vascular access device: Secondary | ICD-10-CM | POA: Diagnosis not present

## 2017-01-19 DIAGNOSIS — Z95828 Presence of other vascular implants and grafts: Secondary | ICD-10-CM

## 2017-01-19 DIAGNOSIS — C674 Malignant neoplasm of posterior wall of bladder: Secondary | ICD-10-CM | POA: Diagnosis not present

## 2017-01-19 DIAGNOSIS — C770 Secondary and unspecified malignant neoplasm of lymph nodes of head, face and neck: Secondary | ICD-10-CM

## 2017-01-19 DIAGNOSIS — C20 Malignant neoplasm of rectum: Secondary | ICD-10-CM

## 2017-01-19 MED ORDER — SODIUM CHLORIDE 0.9% FLUSH
10.0000 mL | Freq: Once | INTRAVENOUS | Status: AC
  Administered 2017-01-19: 10 mL
  Filled 2017-01-19: qty 10

## 2017-01-19 MED ORDER — HEPARIN SOD (PORK) LOCK FLUSH 100 UNIT/ML IV SOLN
500.0000 [IU] | Freq: Once | INTRAVENOUS | Status: AC
  Administered 2017-01-19: 500 [IU]
  Filled 2017-01-19: qty 5

## 2017-01-19 NOTE — Patient Instructions (Signed)

## 2017-01-27 ENCOUNTER — Emergency Department (HOSPITAL_BASED_OUTPATIENT_CLINIC_OR_DEPARTMENT_OTHER)
Admission: EM | Admit: 2017-01-27 | Discharge: 2017-01-27 | Disposition: A | Discharge status: Home / Self Care | Payer: Medicare Other | Source: Home / Self Care | Attending: Emergency Medicine | Admitting: Emergency Medicine

## 2017-01-27 ENCOUNTER — Encounter (HOSPITAL_COMMUNITY): Payer: Self-pay

## 2017-01-27 ENCOUNTER — Emergency Department (HOSPITAL_COMMUNITY)
Admission: EM | Admit: 2017-01-27 | Discharge: 2017-01-27 | Disposition: A | Discharge status: Home / Self Care | Payer: Medicare Other | Source: Home / Self Care | Attending: Emergency Medicine | Admitting: Emergency Medicine

## 2017-01-27 ENCOUNTER — Emergency Department (HOSPITAL_COMMUNITY): Payer: Medicare Other

## 2017-01-27 DIAGNOSIS — R7881 Bacteremia: Secondary | ICD-10-CM | POA: Diagnosis not present

## 2017-01-27 DIAGNOSIS — M79609 Pain in unspecified limb: Secondary | ICD-10-CM | POA: Diagnosis not present

## 2017-01-27 DIAGNOSIS — Z79899 Other long term (current) drug therapy: Secondary | ICD-10-CM

## 2017-01-27 DIAGNOSIS — L03115 Cellulitis of right lower limb: Secondary | ICD-10-CM | POA: Insufficient documentation

## 2017-01-27 DIAGNOSIS — Z87891 Personal history of nicotine dependence: Secondary | ICD-10-CM | POA: Insufficient documentation

## 2017-01-27 DIAGNOSIS — R509 Fever, unspecified: Secondary | ICD-10-CM

## 2017-01-27 DIAGNOSIS — D012 Carcinoma in situ of rectum: Secondary | ICD-10-CM

## 2017-01-27 LAB — CBC WITH DIFFERENTIAL/PLATELET
Basophils Absolute: 0 10*3/uL (ref 0.0–0.1)
Basophils Relative: 0 %
EOS ABS: 0.1 10*3/uL (ref 0.0–0.7)
Eosinophils Relative: 1 %
HEMATOCRIT: 36.4 % — AB (ref 39.0–52.0)
HEMOGLOBIN: 12.5 g/dL — AB (ref 13.0–17.0)
LYMPHS ABS: 0.9 10*3/uL (ref 0.7–4.0)
LYMPHS PCT: 12 %
MCH: 31.3 pg (ref 26.0–34.0)
MCHC: 34.3 g/dL (ref 30.0–36.0)
MCV: 91.2 fL (ref 78.0–100.0)
MONOS PCT: 8 %
Monocytes Absolute: 0.6 10*3/uL (ref 0.1–1.0)
NEUTROS ABS: 5.9 10*3/uL (ref 1.7–7.7)
Neutrophils Relative %: 79 %
Platelets: 123 10*3/uL — ABNORMAL LOW (ref 150–400)
RBC: 3.99 MIL/uL — AB (ref 4.22–5.81)
RDW: 17.5 % — ABNORMAL HIGH (ref 11.5–15.5)
WBC: 7.6 10*3/uL (ref 4.0–10.5)

## 2017-01-27 LAB — COMPREHENSIVE METABOLIC PANEL
ALK PHOS: 57 U/L (ref 38–126)
ALT: 28 U/L (ref 17–63)
ANION GAP: 5 (ref 5–15)
AST: 29 U/L (ref 15–41)
Albumin: 3.7 g/dL (ref 3.5–5.0)
BILIRUBIN TOTAL: 0.7 mg/dL (ref 0.3–1.2)
BUN: 14 mg/dL (ref 6–20)
CALCIUM: 10 mg/dL (ref 8.9–10.3)
CO2: 26 mmol/L (ref 22–32)
CREATININE: 1.11 mg/dL (ref 0.61–1.24)
Chloride: 106 mmol/L (ref 101–111)
GFR calc non Af Amer: >60 mL/min (ref >60)
GLUCOSE: 111 mg/dL — AB (ref 65–99)
Potassium: 4.1 mmol/L (ref 3.5–5.1)
SODIUM: 137 mmol/L (ref 135–145)
TOTAL PROTEIN: 6.4 g/dL — AB (ref 6.5–8.1)

## 2017-01-27 MED ORDER — AMOXICILLIN-POT CLAVULANATE 875-125 MG PO TABS: 1.0000 | ORAL_TABLET | Freq: Two times a day (BID) | ORAL | 0 refills | Status: DC

## 2017-01-27 MED ORDER — HEPARIN SOD (PORK) LOCK FLUSH 100 UNIT/ML IV SOLN
500.0000 [IU] | Freq: Once | INTRAVENOUS | Status: AC
  Administered 2017-01-27: 500 [IU]
  Filled 2017-01-27: qty 5

## 2017-01-27 MED ORDER — ACETAMINOPHEN 500 MG PO TABS
1000.0000 mg | ORAL_TABLET | Freq: Once | ORAL | Status: AC
  Administered 2017-01-27: 1000 mg via ORAL
  Filled 2017-01-27: qty 2

## 2017-01-27 MED ORDER — CEFTRIAXONE SODIUM 2 G IJ SOLR
2.0000 g | Freq: Once | INTRAMUSCULAR | Status: AC
  Administered 2017-01-27: 2 g via INTRAVENOUS
  Filled 2017-01-27: qty 2

## 2017-01-27 NOTE — ED Triage Notes (Signed)
Patient c/o fever at home 100.3 orally and has had right leg swelling since May Patient states the swelling has not gotten  any bigger, but the pain is worse in the right calf area.

## 2017-01-27 NOTE — Discharge Instructions (Signed)
Return to ER with any new, worsening symptoms. Follow-up with Dr. Learta Codding as scheduled on Wednesday.

## 2017-01-27 NOTE — Progress Notes (Signed)
*  PRELIMINARY RESULTS* Vascular Ultrasound Right lower extremity venous duplex has been completed.  Preliminary findings: No evidence of deep vein thrombosis or baker's cysts in the right lower extremity.  Preliminary results given to Dr. Jeneen Rinks @ 13:50   [place] 01/27/2017, 1:53 PM

## 2017-01-27 NOTE — ED Provider Notes (Signed)
[facility] DEPT Provider Note   CSN: [id number] Arrival date & time: 01/27/17  1032     History   Chief Complaint Chief Complaint  Patient presents with  . Fever  . leg swellilng    HPI Nicholas Suarez is a 66 y.o. male. Chief complaint is leg pain and fever  HPI this is a 66 year old male with a history of rectal cancer. He is undergoing chemotherapy. Dr. Learta Codding is his oncologist.  Chemotherapy is Q2 weeks. Due on this Wednesday. He had a low white blood cell count and low platelet count last week. He is not receiving Neulasta rescue.  He had some leg pain in his right leg several weeks ago and underwent a negative upper ultrasound. He has continued discomfort with the leg states it feels weak and heavy versus the left. Now has a rash in the left lower leg and fever of 101 at home.  Past Medical History:  Diagnosis Date  . Allergic rhinitis, seasonal   . Anemia   . Bilateral hydrocele   . Bilateral hydronephrosis   . Chronic constipation   . Edema of right lower extremity   . Rectal cancer ([place]) dx 11/27/2016 via colonscopy w/ bx   oncolgoist-  dr Benay Spice--  rectal adenocarcinoma w/ metastatic retroperitoneal lymphadenopathy  . Wears glasses     Patient Active Problem List   Diagnosis Date Noted  . Port catheter in place 01/17/2017  . Goals of care, counseling/discussion 11/30/2016  . Rectal cancer ([place]) 11/27/2016  . Adenocarcinoma ([place]) 11/17/2016  . Right leg swelling 11/14/2016  . Retroperitoneal lymphadenopathy 11/01/2016  . Hyperlipidemia LDL goal <130 11/01/2016  . Hydronephrosis 11/01/2016  . Allergic rhinitis 02/18/2015  . Asthma   . Routine general medical examination at a health care facility 05/23/2012    Past Surgical History:  Procedure Laterality Date  . COLONOSCOPY WITH ESOPHAGOGASTRODUODENOSCOPY (EGD)  11-27-2016  dr Earlean Shawl   rectal mass  . CYSTOSCOPY WITH BIOPSY N/A 12/11/2016   Procedure: CYSTOSCOPY WITH BIOPSY;  Surgeon: Kathie Rhodes,  MD;  Location: [facility];  Service: Urology;  Laterality: N/A;  . IR FLUORO GUIDE PORT INSERTION RIGHT  12/05/2016  . IR US GUIDE VASC ACCESS RIGHT  12/05/2016  . UMBILICAL HERNIA REPAIR  05/2016       Home Medications    Prior to Admission medications   Medication Sig Start Date End Date Taking? Authorizing Provider  amoxicillin-clavulanate (AUGMENTIN) 875-125 MG tablet Take 1 tablet by mouth 2 (two) times daily. 01/27/17   Tanna Furry, MD  aspirin 325 MG tablet Take 325 mg by mouth every 3 (three) days.     [provider]  glucosamine-chondroitin 500-400 MG tablet Take 1 tablet by mouth every 3 (three) days.     [provider]  lidocaine-prilocaine (EMLA) cream APPLY TO PORT SITE 1 HOUR PRIOR TO USE 11/30/16   [name], NP  Multiple Vitamin (MULTIVITAMIN) tablet Take 1 tablet by mouth every 3 (three) days.     [provider]  Omega-3 Fatty Acids (FISH OIL) 1200 MG CAPS Take by mouth every 3 (three) days.     [provider]  Polyethylene Glycol 3350 (MIRALAX PO) Take by mouth daily.    [provider]  prochlorperazine (COMPAZINE) 10 MG tablet Take 1 tablet (10 mg total) by mouth every 6 (six) hours as needed for nausea or vomiting. 11/30/16   [name], NP  saw palmetto 80 MG capsule Take 80 mg by mouth  every 3 (three) days.     [provider]    Family History Family History  Problem Relation Age of Onset  . Breast cancer Mother   . Alcohol abuse Father   . Heart disease Other   . Cancer Other        Breast Cancer  . Arthritis Other   . Alcohol abuse Other   . Drug abuse Other   . Stroke Neg Hx   . Kidney disease Neg Hx   . Hypertension Neg Hx   . Hyperlipidemia Neg Hx   . Early death Neg Hx   . Colon cancer Neg Hx   . Esophageal cancer Neg Hx   . Rectal cancer Neg Hx   . Stomach cancer Neg Hx     Social History Social History  Substance Use Topics  . Smoking status: Former Smoker     Packs/day: 1.00    Years: 10.00    Types: Cigarettes, Pipe, Cigars    Quit date: 06/12/1988  . Smokeless tobacco: Former Systems developer    Types: Chew    Quit date: 06/12/1993  . Alcohol use No     Allergies   Patient has no known allergies.   Review of Systems Review of Systems  Constitutional: Positive for fever. Negative for appetite change, chills, diaphoresis and fatigue.  HENT: Negative for mouth sores, sore throat and trouble swallowing.   Eyes: Negative for visual disturbance.  Respiratory: Negative for cough, chest tightness, shortness of breath and wheezing.   Cardiovascular: Negative for chest pain.  Gastrointestinal: Negative for abdominal distention, abdominal pain, diarrhea, nausea and vomiting.  Endocrine: Negative for polydipsia, polyphagia and polyuria.  Genitourinary: Negative for dysuria, frequency and hematuria.  Musculoskeletal: Negative for gait problem.  Skin: Positive for rash. Negative for color change and pallor.  Neurological: Positive for weakness. Negative for dizziness, syncope, light-headedness and headaches.  Hematological: Does not bruise/bleed easily.  Psychiatric/Behavioral: Negative for behavioral problems and confusion.     Physical Exam Updated Vital Signs BP 118/69 (BP Location: Right Arm)   Pulse (!) 109   Temp 98.3 F (36.8 C) (Oral)   Resp 17   Ht 6\' 3"  (1.905 m)   Wt 91.2 kg (201 lb)   SpO2 100%   BMI 25.12 kg/m   Physical Exam  Constitutional: He is oriented to person, place, and time. He appears well-developed and well-nourished. No distress.  HENT:  Head: Normocephalic.  Eyes: Pupils are equal, round, and reactive to light. Conjunctivae are normal. No scleral icterus.  Neck: Normal range of motion. Neck supple. No thyromegaly present.  Cardiovascular: Normal rate and regular rhythm.  Exam reveals no gallop and no friction rub.   No murmur heard. Pulmonary/Chest: Effort normal and breath sounds normal. No respiratory distress. He  has no wheezes. He has no rales.  Abdominal: Soft. Bowel sounds are normal. He exhibits no distension. There is no tenderness. There is no rebound.  Musculoskeletal: Normal range of motion.  Neurological: He is alert and oriented to person, place, and time.  Symmetric strength and sensation in bilateral lower extremities without deficit.  Skin: Skin is warm and dry. Rash noted.  Right lower leg with patchy cellulitis from mid upper leg to medial lower leg. No palpable cording. Normal sensation pulses and distal capillary refill.  Psychiatric: He has a normal mood and affect. His behavior is normal.     ED Treatments / Results  Labs (all labs ordered are listed, but only abnormal results are displayed)  Labs Reviewed  CBC WITH DIFFERENTIAL/PLATELET - Abnormal; Notable for the following:       Result Value   RBC 3.99 (*)    Hemoglobin 12.5 (*)    HCT 36.4 (*)    RDW 17.5 (*)    Platelets 123 (*)    All other components within normal limits  COMPREHENSIVE METABOLIC PANEL - Abnormal; Notable for the following:    Glucose, Bld 111 (*)    Total Protein 6.4 (*)    All other components within normal limits  CULTURE, BLOOD (SINGLE)  CULTURE, BLOOD (SINGLE)    EKG  EKG Interpretation None       Radiology Dg Chest 2 View  Result Date: 01/27/2017 CLINICAL DATA:  Has port on left side was put in July. Pt has rectal cancer. Having swelling in his legs. exsmoker quit in the 90's. Asthma. Bronchitis 15 years ago. Pneumonia 10 years ago. Currently has a fever. EXAM: CHEST  2 VIEW COMPARISON:  04/28/2014 FINDINGS: The cardiac silhouette is normal in size and configuration. No mediastinal or hilar masses. No evidence of adenopathy. Clear lungs.  No pleural effusion or pneumothorax. There is a right anterior chest wall power Port-A-Cath, new since prior study. Tip lies in the lower superior vena cava. Skeletal structures are unremarkable. IMPRESSION: No active cardiopulmonary disease.  Electronically Signed   By: Lajean Manes M.D.   On: 01/27/2017 13:00    Procedures Procedures (including critical care time)  Medications Ordered in ED Medications  acetaminophen (TYLENOL) tablet 1,000 mg (1,000 mg Oral Given 01/27/17 1358)  cefTRIAXone (ROCEPHIN) 2 g in dextrose 5 % 50 mL IVPB (0 g Intravenous Stopped 01/27/17 1446)     Initial Impression / Assessment and Plan / ED Course  I have reviewed the triage vital signs and the nursing notes.  Pertinent labs & imaging results that were available during my care of the patient were reviewed by me and considered in my medical decision making (see chart for details).    Negative Doppler ultrasound. He has adequate white blood cell count without neutropenia. Given IV Rocephin. Culture obtained peripherally, and from his Infuse-a-Port in his right chest. Feeling well looking well recheck temp 98 3. Procrit for discharge. He has appointment with Dr. Learta Codding in 4 days. Return here with any new or worsening symptoms. We will contact with any positive cultures. I did make him aware that if a culture was positive he may be called and asked to return.  Final Clinical Impressions(s) / ED Diagnoses   Final diagnoses:  Febrile illness  Cellulitis of right lower extremity    New Prescriptions New Prescriptions   AMOXICILLIN-CLAVULANATE (AUGMENTIN) 875-125 MG TABLET    Take 1 tablet by mouth 2 (two) times daily.     Tanna Furry, MD 01/27/17 [phone]

## 2017-01-28 ENCOUNTER — Other Ambulatory Visit: Payer: Self-pay | Admitting: Oncology

## 2017-01-28 ENCOUNTER — Inpatient Hospital Stay (HOSPITAL_COMMUNITY)
Admission: EM | Admit: 2017-01-28 | Discharge: 2017-02-01 | DRG: 602 | Disposition: A | Discharge status: Home Health | Payer: Medicare Other | Attending: Internal Medicine | Admitting: Internal Medicine

## 2017-01-28 ENCOUNTER — Encounter (HOSPITAL_COMMUNITY): Payer: Self-pay | Admitting: Emergency Medicine

## 2017-01-28 ENCOUNTER — Telehealth: Payer: Self-pay | Admitting: Infectious Diseases

## 2017-01-28 DIAGNOSIS — B9561 Methicillin susceptible Staphylococcus aureus infection as the cause of diseases classified elsewhere: Secondary | ICD-10-CM | POA: Diagnosis present

## 2017-01-28 DIAGNOSIS — Z7982 Long term (current) use of aspirin: Secondary | ICD-10-CM

## 2017-01-28 DIAGNOSIS — D696 Thrombocytopenia, unspecified: Secondary | ICD-10-CM | POA: Diagnosis not present

## 2017-01-28 DIAGNOSIS — A4901 Methicillin susceptible Staphylococcus aureus infection, unspecified site: Secondary | ICD-10-CM | POA: Diagnosis not present

## 2017-01-28 DIAGNOSIS — C20 Malignant neoplasm of rectum: Secondary | ICD-10-CM | POA: Diagnosis present

## 2017-01-28 DIAGNOSIS — C770 Secondary and unspecified malignant neoplasm of lymph nodes of head, face and neck: Secondary | ICD-10-CM | POA: Diagnosis not present

## 2017-01-28 DIAGNOSIS — Z803 Family history of malignant neoplasm of breast: Secondary | ICD-10-CM | POA: Diagnosis not present

## 2017-01-28 DIAGNOSIS — R7881 Bacteremia: Secondary | ICD-10-CM

## 2017-01-28 DIAGNOSIS — C801 Malignant (primary) neoplasm, unspecified: Secondary | ICD-10-CM | POA: Diagnosis not present

## 2017-01-28 DIAGNOSIS — D649 Anemia, unspecified: Secondary | ICD-10-CM

## 2017-01-28 DIAGNOSIS — Z87891 Personal history of nicotine dependence: Secondary | ICD-10-CM

## 2017-01-28 DIAGNOSIS — C786 Secondary malignant neoplasm of retroperitoneum and peritoneum: Secondary | ICD-10-CM | POA: Diagnosis present

## 2017-01-28 DIAGNOSIS — Z9221 Personal history of antineoplastic chemotherapy: Secondary | ICD-10-CM | POA: Diagnosis not present

## 2017-01-28 DIAGNOSIS — D61810 Antineoplastic chemotherapy induced pancytopenia: Secondary | ICD-10-CM | POA: Diagnosis present

## 2017-01-28 DIAGNOSIS — Z811 Family history of alcohol abuse and dependence: Secondary | ICD-10-CM | POA: Diagnosis not present

## 2017-01-28 DIAGNOSIS — L03115 Cellulitis of right lower limb: Principal | ICD-10-CM | POA: Diagnosis present

## 2017-01-28 DIAGNOSIS — K5909 Other constipation: Secondary | ICD-10-CM | POA: Diagnosis present

## 2017-01-28 DIAGNOSIS — T451X5A Adverse effect of antineoplastic and immunosuppressive drugs, initial encounter: Secondary | ICD-10-CM | POA: Diagnosis present

## 2017-01-28 DIAGNOSIS — Z95828 Presence of other vascular implants and grafts: Secondary | ICD-10-CM | POA: Diagnosis not present

## 2017-01-28 DIAGNOSIS — Z85048 Personal history of other malignant neoplasm of rectum, rectosigmoid junction, and anus: Secondary | ICD-10-CM | POA: Diagnosis not present

## 2017-01-28 DIAGNOSIS — C674 Malignant neoplasm of posterior wall of bladder: Secondary | ICD-10-CM | POA: Diagnosis not present

## 2017-01-28 DIAGNOSIS — L0390 Cellulitis, unspecified: Secondary | ICD-10-CM | POA: Diagnosis present

## 2017-01-28 LAB — BASIC METABOLIC PANEL
Anion gap: 6 (ref 5–15)
BUN: 20 mg/dL (ref 6–20)
CHLORIDE: 107 mmol/L (ref 101–111)
CO2: 24 mmol/L (ref 22–32)
Calcium: 9.7 mg/dL (ref 8.9–10.3)
Creatinine, Ser: 1.02 mg/dL (ref 0.61–1.24)
GFR calc Af Amer: >60 mL/min (ref >60)
GLUCOSE: 94 mg/dL (ref 65–99)
POTASSIUM: 4.5 mmol/L (ref 3.5–5.1)
Sodium: 137 mmol/L (ref 135–145)

## 2017-01-28 LAB — BLOOD CULTURE ID PANEL (REFLEXED)
ACINETOBACTER BAUMANNII: NOT DETECTED
CANDIDA TROPICALIS: NOT DETECTED
Candida albicans: NOT DETECTED
Candida glabrata: NOT DETECTED
Candida krusei: NOT DETECTED
Candida parapsilosis: NOT DETECTED
Carbapenem resistance: NOT DETECTED
ENTEROCOCCUS SPECIES: NOT DETECTED
Enterobacter cloacae complex: NOT DETECTED
Enterobacteriaceae species: NOT DETECTED
Escherichia coli: NOT DETECTED
HAEMOPHILUS INFLUENZAE: NOT DETECTED
Klebsiella oxytoca: NOT DETECTED
Klebsiella pneumoniae: NOT DETECTED
LISTERIA MONOCYTOGENES: NOT DETECTED
METHICILLIN RESISTANCE: NOT DETECTED
NEISSERIA MENINGITIDIS: NOT DETECTED
PROTEUS SPECIES: NOT DETECTED
Pseudomonas aeruginosa: NOT DETECTED
SERRATIA MARCESCENS: NOT DETECTED
STAPHYLOCOCCUS AUREUS BCID: DETECTED — AB
STAPHYLOCOCCUS SPECIES: DETECTED — AB
STREPTOCOCCUS PYOGENES: NOT DETECTED
STREPTOCOCCUS SPECIES: NOT DETECTED
Streptococcus agalactiae: NOT DETECTED
Streptococcus pneumoniae: NOT DETECTED
VANCOMYCIN RESISTANCE: NOT DETECTED

## 2017-01-28 LAB — CBC WITH DIFFERENTIAL/PLATELET
BASOS ABS: 0 10*3/uL (ref 0.0–0.1)
BASOS PCT: 0 %
EOS ABS: 0.4 10*3/uL (ref 0.0–0.7)
Eosinophils Relative: 4 %
HEMATOCRIT: 37.1 % — AB (ref 39.0–52.0)
HEMOGLOBIN: 12.5 g/dL — AB (ref 13.0–17.0)
Lymphocytes Relative: 11 %
Lymphs Abs: 1.1 10*3/uL (ref 0.7–4.0)
MCH: 30.9 pg (ref 26.0–34.0)
MCHC: 33.7 g/dL (ref 30.0–36.0)
MCV: 91.8 fL (ref 78.0–100.0)
MONO ABS: 0.9 10*3/uL (ref 0.1–1.0)
Monocytes Relative: 9 %
NEUTROS ABS: 7.7 10*3/uL (ref 1.7–7.7)
NEUTROS PCT: 76 %
Platelets: 125 10*3/uL — ABNORMAL LOW (ref 150–400)
RBC: 4.04 MIL/uL — ABNORMAL LOW (ref 4.22–5.81)
RDW: 17.7 % — AB (ref 11.5–15.5)
WBC: 10 10*3/uL (ref 4.0–10.5)

## 2017-01-28 LAB — I-STAT CG4 LACTIC ACID, ED: LACTIC ACID, VENOUS: 1.04 mmol/L (ref 0.5–1.9)

## 2017-01-28 MED ORDER — ASPIRIN EC 325 MG PO TBEC
325.0000 mg | DELAYED_RELEASE_TABLET | ORAL | Status: DC
  Administered 2017-01-29 – 2017-02-01 (×2): 325 mg via ORAL
  Filled 2017-01-28 (×2): qty 1

## 2017-01-28 MED ORDER — PROCHLORPERAZINE MALEATE 10 MG PO TABS: 10.0000 mg | ORAL_TABLET | Freq: Four times a day (QID) | ORAL | Status: DC | PRN

## 2017-01-28 MED ORDER — VANCOMYCIN HCL 10 G IV SOLR
1250.0000 mg | Freq: Two times a day (BID) | INTRAVENOUS | Status: DC
  Administered 2017-01-28: 1250 mg via INTRAVENOUS
  Filled 2017-01-28 (×2): qty 1250

## 2017-01-28 MED ORDER — SODIUM CHLORIDE 0.9 % IV BOLUS (SEPSIS)
1000.0000 mL | Freq: Once | INTRAVENOUS | Status: AC
Start: 2017-01-28 — End: 2017-01-28
  Administered 2017-01-28: 1000 mL via INTRAVENOUS

## 2017-01-28 MED ORDER — VANCOMYCIN HCL IN DEXTROSE 1-5 GM/200ML-% IV SOLN: 1000.0000 mg | Freq: Every day | INTRAVENOUS | Status: DC

## 2017-01-28 MED ORDER — VANCOMYCIN HCL IN DEXTROSE 1-5 GM/200ML-% IV SOLN
1000.0000 mg | Freq: Once | INTRAVENOUS | Status: AC
  Administered 2017-01-28: 1000 mg via INTRAVENOUS
  Filled 2017-01-28: qty 200

## 2017-01-28 MED ORDER — CEFAZOLIN SODIUM-DEXTROSE 1-4 GM/50ML-% IV SOLN
1.0000 g | Freq: Once | INTRAVENOUS | Status: AC
  Administered 2017-01-28: 1 g via INTRAVENOUS
  Filled 2017-01-28: qty 50

## 2017-01-28 MED ORDER — ACETAMINOPHEN 325 MG PO TABS
650.0000 mg | ORAL_TABLET | ORAL | Status: DC | PRN
  Administered 2017-01-28 – 2017-01-31 (×5): 650 mg via ORAL
  Filled 2017-01-28 (×5): qty 2

## 2017-01-28 MED ORDER — ENOXAPARIN SODIUM 40 MG/0.4ML ~~LOC~~ SOLN
40.0000 mg | SUBCUTANEOUS | Status: DC
  Administered 2017-01-28 – 2017-01-29 (×2): 40 mg via SUBCUTANEOUS
  Filled 2017-01-28 (×2): qty 0.4

## 2017-01-28 MED ORDER — CEFAZOLIN SODIUM-DEXTROSE 1-4 GM/50ML-% IV SOLN
1.0000 g | Freq: Three times a day (TID) | INTRAVENOUS | Status: DC
  Administered 2017-01-28 – 2017-01-29 (×2): 1 g via INTRAVENOUS
  Filled 2017-01-28 (×3): qty 50

## 2017-01-28 NOTE — ED Notes (Signed)
Call report but nurse busy at this time with another pt will call me back.

## 2017-01-28 NOTE — ED Triage Notes (Signed)
Pt seen at [facility] yesterday for fevers and RLE pain, pt received call from Dr. Johnnye Sima that blood cultures positive for staph, given IV antibiotics and sent home with po antibiotics. Told to return to [facility]. Pt has started po antibiotics and taken ibuprofen, fevers 100.3 at home. Pt c/o pain in RLE.

## 2017-01-28 NOTE — H&P (Signed)
History and Physical    Nicholas Suarez NLG:[id number] DOB: May 09, 1951 DOA: 01/28/2017  PCP: Janith Lima, MD Patient coming from: home  I have personally briefly reviewed patient's old medical records in [place]  Chief Complaint: right leg pain  HPI: Nicholas Suarez is a 66 y.o. male with medical history significant of rectal cancer undergoing chemotherapy admitted with right lower extremity cellulitis. Patient came to the emergency room on 01/27/2017 with complaints of fever and right leg swelling. He had an ultrasound of the right lower extremity which was negative for any DVT. He was given IV Rocephin, blood cultures were obtained and was discharged home on Augmentin. He was asked to follow-up with Dr. Ammie Dalton. But today his 1 out of 2 blood cultures came back positive for MSSA bacteremia. So Dr. Johnnye Sima called him to come back to the emergency room to get admitted to the hospital. 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 he is not sure if he had some kind of bites insect bites. He denied any nausea vomiting or diarrhea. He does not have any urinary complaints. He gets chemotherapy once in 2 weeks. ED Course: He received vancomycin and Ancef. Blood cultures were drawn. Lactic acid level was 1.04. White count was 10 hemoglobin 12.5 and platelet count was 125. His BUN was 20 creatinine 1.02 sodium 131 potassium 4.5.  Review of Systems: As per HPI otherwise 10 point review of systems negative.    Past Medical History:  Diagnosis Date  . Allergic rhinitis, seasonal   . Anemia   . Bilateral hydrocele   . Bilateral hydronephrosis   . Chronic constipation   . Edema of right lower extremity   . 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 Wears glasses     Past Surgical History:  Procedure Laterality Date  . COLONOSCOPY WITH  ESOPHAGOGASTRODUODENOSCOPY (EGD)  11-27-2016  dr Earlean Shawl   rectal mass  . CYSTOSCOPY WITH BIOPSY N/A 12/11/2016   Procedure: CYSTOSCOPY WITH BIOPSY;  Surgeon: Kathie Rhodes, MD;  Location: [facility];  Service: Urology;  Laterality: N/A;  . IR FLUORO GUIDE PORT INSERTION RIGHT  12/05/2016  . IR US GUIDE VASC ACCESS RIGHT  12/05/2016  . UMBILICAL HERNIA REPAIR  05/2016     reports that he quit smoking about 28 years ago. His smoking use included Cigarettes, Pipe, and Cigars. He has a 10.00 pack-year smoking history. He quit smokeless tobacco use about 23 years ago. His smokeless tobacco use included Chew. He reports that he does not drink alcohol or use drugs.  No Known Allergies  Family History  Problem Relation Age of Onset  . Breast cancer Mother   . Alcohol abuse Father   . Heart disease Other   . Cancer Other        Breast Cancer  . Arthritis Other   . Alcohol abuse Other   . Drug abuse Other   . Stroke Neg Hx   . Kidney disease Neg Hx   . Hypertension Neg Hx   . Hyperlipidemia Neg Hx   . Early death Neg Hx   . Colon cancer Neg Hx   . Esophageal cancer Neg Hx   . Rectal cancer Neg Hx   . Stomach cancer Neg Hx     Prior to Admission medications   Medication Sig Start Date End Date Taking? Authorizing Provider  amoxicillin-clavulanate (  AUGMENTIN) 875-125 MG tablet Take 1 tablet by mouth 2 (two) times daily. 01/27/17  Yes Tanna Furry, MD  aspirin 325 MG tablet Take 325 mg by mouth every 3 (three) days.    Yes [provider]  glucosamine-chondroitin 500-400 MG tablet Take 1 tablet by mouth every 3 (three) days.    Yes [provider]  ibuprofen (ADVIL,MOTRIN) 200 MG tablet Take 400 mg by mouth every 6 (six) hours as needed for moderate pain.   Yes [provider]  lidocaine-prilocaine (EMLA) cream APPLY TO PORT SITE 1 HOUR PRIOR TO USE 11/30/16  Yes [name], NP  Multiple Vitamin (MULTIVITAMIN) tablet Take 1 tablet by mouth every 3  (three) days.    Yes [provider]  Omega-3 Fatty Acids (FISH OIL) 1200 MG CAPS Take by mouth every 3 (three) days.    Yes [provider]  Polyethylene Glycol 3350 (MIRALAX PO) Take by mouth daily.   Yes [provider]  prochlorperazine (COMPAZINE) 10 MG tablet Take 1 tablet (10 mg total) by mouth every 6 (six) hours as needed for nausea or vomiting. 11/30/16  Yes [name], NP  saw palmetto 80 MG capsule Take 80 mg by mouth every 3 (three) days.    Yes [provider]    Physical Exam: Vitals:   01/28/17 1314 01/28/17 1400 01/28/17 1539 01/28/17 1752  BP: 105/75 127/78 107/63 108/65  Pulse: 89 80 83 88  Resp: 18  16 16   Temp: 97.7 F (36.5 C)     TempSrc: Oral     SpO2: 100% 97% 100% 100%    Constitutional: NAD, calm, comfortable Vitals:   01/28/17 1314 01/28/17 1400 01/28/17 1539 01/28/17 1752  BP: 105/75 127/78 107/63 108/65  Pulse: 89 80 83 88  Resp: 18  16 16   Temp: 97.7 F (36.5 C)     TempSrc: Oral     SpO2: 100% 97% 100% 100%   Eyes: PERRL, lids and conjunctivae normal ENMT: Mucous membranes are moist. Posterior pharynx clear of any exudate or lesions.Normal dentition.  Neck: normal, supple, no masses, no thyromegaly Respiratory: clear to auscultation bilaterally, no wheezing, no crackles. Normal respiratory effort. No accessory muscle use.  Cardiovascular: Regular rate and rhythm, no murmurs / rubs / gallops. No extremity edema. 2+ pedal pulses. No carotid bruits.  Abdomen: no tenderness, no masses palpated. No hepatosplenomegaly. Bowel sounds positive.  Musculoskeletal: no clubbing / cyanosis. No joint deformity upper and lower extremities. Good ROM, no contractures. Normal muscle tone.  Skin: scattered erythematous skin lesions on right lower extremity.slightly warm to touch.edema present. Neurologic: CN 2-12 grossly intact. Sensation intact, DTR normal. Strength 5/5 in all 4.  Psychiatric: Normal judgment and insight.  Alert and oriented x 3. Normal mood.    Labs on Admission: I have personally reviewed following labs and imaging studies  CBC:  Recent Labs Lab 01/27/17 1250 01/28/17 1406  WBC 7.6 10.0  NEUTROABS 5.9 7.7  HGB 12.5* 12.5*  HCT 36.4* 37.1*  MCV 91.2 91.8  PLT 123* 476*   Basic Metabolic Panel:  Recent Labs Lab 01/27/17 1250 01/28/17 1406  NA 137 137  K 4.1 4.5  CL 106 107  CO2 26 24  GLUCOSE 111* 94  BUN 14 20  CREATININE 1.11 1.02  CALCIUM 10.0 9.7   GFR: Estimated Creatinine Clearance: 85.1 mL/min (by C-G formula based on SCr of 1.02 mg/dL). Liver Function Tests:  Recent Labs Lab 01/27/17 1250  AST 29  ALT 28  ALKPHOS 57  BILITOT 0.7  PROT 6.4*  ALBUMIN 3.7   No results for input(s): LIPASE, AMYLASE in the last 168 hours. No results for input(s): AMMONIA in the last 168 hours. Coagulation Profile: No results for input(s): INR, PROTIME in the last 168 hours. Cardiac Enzymes: No results for input(s): CKTOTAL, CKMB, CKMBINDEX, TROPONINI in the last 168 hours. BNP (last 3 results) No results for input(s): PROBNP in the last 8760 hours. HbA1C: No results for input(s): HGBA1C in the last 72 hours. CBG: No results for input(s): GLUCAP in the last 168 hours. Lipid Profile: No results for input(s): CHOL, HDL, LDLCALC, TRIG, CHOLHDL, LDLDIRECT in the last 72 hours. Thyroid Function Tests: No results for input(s): TSH, T4TOTAL, FREET4, T3FREE, THYROIDAB in the last 72 hours. Anemia Panel: No results for input(s): VITAMINB12, FOLATE, FERRITIN, TIBC, IRON, RETICCTPCT in the last 72 hours. Urine analysis:    Component Value Date/Time   COLORURINE YELLOW 11/01/2016 0949   APPEARANCEUR CLEAR 11/01/2016 0949   LABSPEC <=1.005 (A) 11/01/2016 0949   PHURINE 6.0 11/01/2016 0949   GLUCOSEU NEGATIVE 11/01/2016 0949   HGBUR TRACE-INTACT (A) 11/01/2016 0949   BILIRUBINUR NEGATIVE 11/01/2016 0949   KETONESUR NEGATIVE 11/01/2016 0949   PROTEINUR Negative 12/20/2016  1036   UROBILINOGEN 0.2 11/01/2016 0949   NITRITE NEGATIVE 11/01/2016 0949   LEUKOCYTESUR NEGATIVE 11/01/2016 0949    Radiological Exams on Admission: Dg Chest 2 View  Result Date: 01/27/2017 CLINICAL DATA:  Has port on left side was put in July. Pt has rectal cancer. Having swelling in his legs. exsmoker quit in the 90's. Asthma. Bronchitis 15 years ago. Pneumonia 10 years ago. Currently has a fever. EXAM: CHEST  2 VIEW COMPARISON:  04/28/2014 FINDINGS: The cardiac silhouette is normal in size and configuration. No mediastinal or hilar masses. No evidence of adenopathy. Clear lungs.  No pleural effusion or pneumothorax. There is a right anterior chest wall power Port-A-Cath, new since prior study. Tip lies in the lower superior vena cava. Skeletal structures are unremarkable. IMPRESSION: No active cardiopulmonary disease. Electronically Signed   By: Lajean Manes M.D.   On: 01/27/2017 13:00    EKG: Independently reviewed.   Assessment/Plan Active Problems:   Cellulitis   Right lower extremity cellulitis with MSSA bacteremia I will continue Ancef and vancomycin. Pharmacy to dose vancomycin and follow levels. I have sent a message to cardiology to do a TEE tomorrow to rule out endocarditis. Appreciate Dr. Algis Downs input.  Rectal carcinoma patient is on chemotherapy every 2 weeks. I have placed patient on Dr. Michel Bickers list.    DVT prophylaxis lovenox. Code Status: full Family Communication:  Disposition Plan:home  Consults called: dr hatcher and dr sherril Admission status: medsurg   Georgette Shell MD [facility]  If 7PM-7AM, please contact night-coverage [email or web address] Password [email or web address]  01/28/2017, 6:01 PM

## 2017-01-28 NOTE — Consult Note (Signed)
[facility] for Infectious Disease  Date of Admission:  01/28/2017  Date of Consult:  01/28/2017  Reason for Consult: MSSA bacteremia Referring Physician: CHAMP  Impression/Recommendation MSSA bacteremia RLE cellulitis Last Tet vax 5 years prior  would continue ancef Check TEE Repeat BCx in AM  Port  Rectal CA (last CTX 8-8) Let Dr Benay Spice know of adm (sched for next CTX on 8-22).   Thank you so much for this interesting consult,   Bobby Rumpf (pager) [phone] [email or web address]  Nicholas Suarez is an 66 y.o. male.  HPI: 66 yo M with hx of abd mesh placed in 05-2016 followed by multiple procedures resulting in dx of rectal CA (inoperable per pt) June 2018. He has received CTX, last on 8-8.  He has been working on and under houses recently and has had multiple wounds on his RLE. He developed back pain on 8-18 as well as fatigue. He took his temp (102) and came to ED. He was then found to have a rash on his R thigh, dx with cellulitis and d/c home with augmentin.  Over last 24h, he has felt fatigued and had chills.  Today he felt better however his BCx from ED resulted MSSA. I called him back to ED for admsission.    Past Medical History:  Diagnosis Date  . Allergic rhinitis, seasonal   . Anemia   . Bilateral hydrocele   . Bilateral hydronephrosis   . Chronic constipation   . Edema of right lower extremity   . 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 Wears glasses     Past Surgical History:  Procedure Laterality Date  . COLONOSCOPY WITH ESOPHAGOGASTRODUODENOSCOPY (EGD)  11-27-2016  dr Earlean Shawl   rectal mass  . CYSTOSCOPY WITH BIOPSY N/A 12/11/2016   Procedure: CYSTOSCOPY WITH BIOPSY;  Surgeon: Kathie Rhodes, MD;  Location: [facility];  Service: Urology;  Laterality: N/A;  . IR FLUORO GUIDE PORT INSERTION RIGHT   12/05/2016  . IR US GUIDE VASC ACCESS RIGHT  12/05/2016  . UMBILICAL HERNIA REPAIR  05/2016     No Known Allergies  Medications: Scheduled:   Abtx:  Anti-infectives    Start     Dose/Rate Route Frequency Ordered Stop   01/28/17 1345  ceFAZolin (ANCEF) IVPB 1 g/50 mL premix     1 g 100 mL/hr over 30 Minutes Intravenous  Once 01/28/17 1344 01/28/17 1457   01/28/17 1345  vancomycin (VANCOCIN) IVPB 1000 mg/200 mL premix     1,000 mg 200 mL/hr over 60 Minutes Intravenous  Once 01/28/17 1344        Total days of antibiotics: 0 ancef          Social History:  reports that he quit smoking about 28 years ago. His smoking use included Cigarettes, Pipe, and Cigars. He has a 10.00 pack-year smoking history. He quit smokeless tobacco use about 23 years ago. His smokeless tobacco use included Chew. He reports that he does not drink alcohol or use drugs.  Family History  Problem Relation Age of Onset  . Breast cancer Mother   . Alcohol abuse Father   . Heart disease Other   . Cancer Other        Breast Cancer  . Arthritis Other   . Alcohol abuse Other   . Drug abuse Other   . Stroke Neg Hx   .  Kidney disease Neg Hx   . Hypertension Neg Hx   . Hyperlipidemia Neg Hx   . Early death Neg Hx   . Colon cancer Neg Hx   . Esophageal cancer Neg Hx   . Rectal cancer Neg Hx   . Stomach cancer Neg Hx     History obtained from the patient General ROS: +f/c, no dysphagia, no problems with port (pain/swelling), +constipation, normal urine. see HPI.  Please see HPI. 12 point ROS o/w (-)  Blood pressure 105/75, pulse 89, temperature 97.7 F (36.5 C), temperature source Oral, resp. rate 18, SpO2 100 %. General appearance: alert, cooperative and no distress Eyes: negative findings: conjunctivae and sclerae normal and pupils equal, round, reactive to light and accomodation Throat: lips, mucosa, and tongue normal; teeth and gums normal Neck: no adenopathy and supple, symmetrical, trachea  midline Lungs: clear to auscultation bilaterally Chest wall: R chest port with no tenderness or erythema.  Heart: regular rate and rhythm Abdomen: normal findings: bowel sounds normal and soft, non-tender Rectal: no masses or tenderness felt at verge.  Extremities: edema mild R ankle.  Skin: scabbed, erythematous lesion on R calf. erythema over R thigh anterior and posterior.    Results for orders placed or performed during the hospital encounter of 01/28/17 (from the past 48 hour(s))  Basic metabolic panel     Status: None   Collection Time: 01/28/17  2:06 PM  Result Value Ref Range   Sodium 137 135 - 145 mmol/L   Potassium 4.5 3.5 - 5.1 mmol/L   Chloride 107 101 - 111 mmol/L   CO2 24 22 - 32 mmol/L   Glucose, Bld 94 65 - 99 mg/dL   BUN 20 6 - 20 mg/dL   Creatinine, Ser 1.02 0.61 - 1.24 mg/dL   Calcium 9.7 8.9 - 10.3 mg/dL   GFR calc non Af Amer >60 >60 mL/min   GFR calc Af Amer >60 >60 mL/min    Comment: (NOTE) The eGFR has been calculated using the CKD EPI equation. This calculation has not been validated in all clinical situations. eGFR's persistently <60 mL/min signify possible Chronic Kidney Disease.    Anion gap 6 5 - 15  CBC with Differential     Status: Abnormal   Collection Time: 01/28/17  2:06 PM  Result Value Ref Range   WBC 10.0 4.0 - 10.5 K/uL   RBC 4.04 (L) 4.22 - 5.81 MIL/uL   Hemoglobin 12.5 (L) 13.0 - 17.0 g/dL   HCT 37.1 (L) 39.0 - 52.0 %   MCV 91.8 78.0 - 100.0 fL   MCH 30.9 26.0 - 34.0 pg   MCHC 33.7 30.0 - 36.0 g/dL   RDW 17.7 (H) 11.5 - 15.5 %   Platelets 125 (L) 150 - 400 K/uL   Neutrophils Relative % 76 %   Neutro Abs 7.7 1.7 - 7.7 K/uL   Lymphocytes Relative 11 %   Lymphs Abs 1.1 0.7 - 4.0 K/uL   Monocytes Relative 9 %   Monocytes Absolute 0.9 0.1 - 1.0 K/uL   Eosinophils Relative 4 %   Eosinophils Absolute 0.4 0.0 - 0.7 K/uL   Basophils Relative 0 %   Basophils Absolute 0.0 0.0 - 0.1 K/uL  I-Stat CG4 Lactic Acid, ED     Status: None    Collection Time: 01/28/17  2:40 PM  Result Value Ref Range   Lactic Acid, Venous 1.04 0.5 - 1.9 mmol/L      Component Value Date/Time   SDES  01/27/2017 1251    BLOOD LEFT ANTECUBITAL Performed at [facility], [place] [address] [place], [place] [address]    SPECREQUEST  01/27/2017 1251    BOTTLES DRAWN AEROBIC AND ANAEROBIC Blood Culture adequate volume   CULT PENDING 01/27/2017 1251   REPTSTATUS PENDING 01/27/2017 1251   Dg Chest 2 View  Result Date: 01/27/2017 CLINICAL DATA:  Has port on left side was put in July. Pt has rectal cancer. Having swelling in his legs. exsmoker quit in the 90's. Asthma. Bronchitis 15 years ago. Pneumonia 10 years ago. Currently has a fever. EXAM: CHEST  2 VIEW COMPARISON:  04/28/2014 FINDINGS: The cardiac silhouette is normal in size and configuration. No mediastinal or hilar masses. No evidence of adenopathy. Clear lungs.  No pleural effusion or pneumothorax. There is a right anterior chest wall power Port-A-Cath, new since prior study. Tip lies in the lower superior vena cava. Skeletal structures are unremarkable. IMPRESSION: No active cardiopulmonary disease. Electronically Signed   By: Lajean Manes M.D.   On: 01/27/2017 13:00   Recent Results (from the past 240 hour(s))  Culture, blood (single)     Status: None (Preliminary result)   Collection Time: 01/27/17 12:50 PM  Result Value Ref Range Status   Specimen Description BLOOD PORTA CATH  Final   Special Requests   Final    BOTTLES DRAWN AEROBIC AND ANAEROBIC Blood Culture adequate volume   Culture  Setup Time   Final    GRAM POSITIVE COCCI IN CLUSTERS AEROBIC BOTTLE ONLY CRITICAL RESULT CALLED TO, READ BACK BY AND VERIFIED WITH: S GOUGE,RN AT 1123 01/28/17 BY L BENFIELD Performed at [facility], [address] [place], [place] [address]    Culture GRAM POSITIVE COCCI  Final   Report Status PENDING  Incomplete  Blood Culture ID Panel (Reflexed)     Status: Abnormal   Collection Time:  01/27/17 12:50 PM  Result Value Ref Range Status   Enterococcus species NOT DETECTED NOT DETECTED Final   Vancomycin resistance NOT DETECTED NOT DETECTED Final   Listeria monocytogenes NOT DETECTED NOT DETECTED Final   Staphylococcus species DETECTED (A) NOT DETECTED Final    Comment: CRITICAL RESULT CALLED TO, READ BACK BY AND VERIFIED WITH: S GOUGE,RN AT 1123 01/28/17 BY L BENFIELD    Staphylococcus aureus DETECTED (A) NOT DETECTED Final    Comment: CRITICAL RESULT CALLED TO, READ BACK BY AND VERIFIED WITH: S GOUGE,RN AT 1123 01/28/17 BY L BENFIELD    Methicillin resistance NOT DETECTED NOT DETECTED Final   Streptococcus species NOT DETECTED NOT DETECTED Final   Streptococcus agalactiae NOT DETECTED NOT DETECTED Final   Streptococcus pneumoniae NOT DETECTED NOT DETECTED Final   Streptococcus pyogenes NOT DETECTED NOT DETECTED Final   Acinetobacter baumannii NOT DETECTED NOT DETECTED Final   Enterobacteriaceae species NOT DETECTED NOT DETECTED Final   Enterobacter cloacae complex NOT DETECTED NOT DETECTED Final   Escherichia coli NOT DETECTED NOT DETECTED Final   Klebsiella oxytoca NOT DETECTED NOT DETECTED Final   Klebsiella pneumoniae NOT DETECTED NOT DETECTED Final   Proteus species NOT DETECTED NOT DETECTED Final   Serratia marcescens NOT DETECTED NOT DETECTED Final   Carbapenem resistance NOT DETECTED NOT DETECTED Final   Haemophilus influenzae NOT DETECTED NOT DETECTED Final   Neisseria meningitidis NOT DETECTED NOT DETECTED Final   Pseudomonas aeruginosa NOT DETECTED NOT DETECTED Final   Candida albicans NOT DETECTED NOT DETECTED Final   Candida glabrata NOT DETECTED NOT DETECTED Final  Candida krusei NOT DETECTED NOT DETECTED Final   Candida parapsilosis NOT DETECTED NOT DETECTED Final   Candida tropicalis NOT DETECTED NOT DETECTED Final    Comment: Performed at [facility], [place] [address] [place], [place] [address]  Culture, blood (single)     Status: None  (Preliminary result)   Collection Time: 01/27/17 12:51 PM  Result Value Ref Range Status   Specimen Description   Final    BLOOD LEFT ANTECUBITAL Performed at [facility], [place] [address] [place], [place] [address]    Special Requests   Final    BOTTLES DRAWN AEROBIC AND ANAEROBIC Blood Culture adequate volume   Culture PENDING  Incomplete   Report Status PENDING  Incomplete      01/28/2017, 3:32 PM     LOS: 0 days    Records and images were personally reviewed where available.  Bobby Rumpf, MD [facility] [place] [facility] [phone] 01/28/2017, 3:32 PM

## 2017-01-28 NOTE — ED Provider Notes (Signed)
[facility] DEPT Provider Note   CSN: [id number] Arrival date & time: 01/28/17  1212     History   Chief Complaint Chief Complaint  Patient presents with  . positive blood cultures    HPI Nicholas Suarez is a 66 y.o. male.  Pt presents to the ED today because Dr. Johnnye Sima called him and told him to come back as he had 1 of 2 positive blood cultures.  The pt was seen in the ED yesterday at [facility] and was diagnosed with cellulitis.  He was given IV abx yesterday and put on oral abx.  The pt still had a fever last night.  He does have a hx of rectal cancer and gets chemo via his port.  He is followed by Dr. Benay Spice.  Last infusion on 8/10.      Past Medical History:  Diagnosis Date  . Allergic rhinitis, seasonal   . Anemia   . Bilateral hydrocele   . Bilateral hydronephrosis   . Chronic constipation   . Edema of right lower extremity   . 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 Wears glasses     Patient Active Problem List   Diagnosis Date Noted  . Port catheter in place 01/17/2017  . Goals of care, counseling/discussion 11/30/2016  . Rectal cancer ([place]) 11/27/2016  . Adenocarcinoma ([place]) 11/17/2016  . Right leg swelling 11/14/2016  . Retroperitoneal lymphadenopathy 11/01/2016  . Hyperlipidemia LDL goal <130 11/01/2016  . Hydronephrosis 11/01/2016  . Allergic rhinitis 02/18/2015  . Asthma   . Routine general medical examination at a health care facility 05/23/2012    Past Surgical History:  Procedure Laterality Date  . COLONOSCOPY WITH ESOPHAGOGASTRODUODENOSCOPY (EGD)  11-27-2016  dr Earlean Shawl   rectal mass  . CYSTOSCOPY WITH BIOPSY N/A 12/11/2016   Procedure: CYSTOSCOPY WITH BIOPSY;  Surgeon: Kathie Rhodes, MD;  Location: [facility];  Service: Urology;  Laterality: N/A;  . IR FLUORO GUIDE PORT INSERTION RIGHT  12/05/2016  . IR US  GUIDE VASC ACCESS RIGHT  12/05/2016  . UMBILICAL HERNIA REPAIR  05/2016       Home Medications    Prior to Admission medications   Medication Sig Start Date End Date Taking? Authorizing Provider  amoxicillin-clavulanate (AUGMENTIN) 875-125 MG tablet Take 1 tablet by mouth 2 (two) times daily. 01/27/17   Tanna Furry, MD  aspirin 325 MG tablet Take 325 mg by mouth every 3 (three) days.     [provider]  glucosamine-chondroitin 500-400 MG tablet Take 1 tablet by mouth every 3 (three) days.     [provider]  lidocaine-prilocaine (EMLA) cream APPLY TO PORT SITE 1 HOUR PRIOR TO USE 11/30/16   [name], NP  Multiple Vitamin (MULTIVITAMIN) tablet Take 1 tablet by mouth every 3 (three) days.     [provider]  Omega-3 Fatty Acids (FISH OIL) 1200 MG CAPS Take by mouth every 3 (three) days.     [provider]  Polyethylene Glycol 3350 (MIRALAX PO) Take by mouth daily.    [provider]  prochlorperazine (COMPAZINE) 10 MG tablet Take 1 tablet (10 mg total) by mouth every 6 (six) hours as needed for nausea or vomiting. 11/30/16   [name], NP  saw palmetto 80 MG capsule Take 80 mg by mouth every 3 (three) days.     [provider]  Family History Family History  Problem Relation Age of Onset  . Breast cancer Mother   . Alcohol abuse Father   . Heart disease Other   . Cancer Other        Breast Cancer  . Arthritis Other   . Alcohol abuse Other   . Drug abuse Other   . Stroke Neg Hx   . Kidney disease Neg Hx   . Hypertension Neg Hx   . Hyperlipidemia Neg Hx   . Early death Neg Hx   . Colon cancer Neg Hx   . Esophageal cancer Neg Hx   . Rectal cancer Neg Hx   . Stomach cancer Neg Hx     Social History Social History  Substance Use Topics  . Smoking status: Former Smoker    Packs/day: 1.00    Years: 10.00    Types: Cigarettes, Pipe, Cigars    Quit date: 06/12/1988  . Smokeless tobacco: Former Systems developer    Types:  Chew    Quit date: 06/12/1993  . Alcohol use No     Allergies   Patient has no known allergies.   Review of Systems Review of Systems  Musculoskeletal:       Redness to right leg  All other systems reviewed and are negative.    Physical Exam Updated Vital Signs BP 105/75 (BP Location: Right Arm)   Pulse 89   Temp 97.7 F (36.5 C) (Oral)   Resp 18   SpO2 100%   Physical Exam  Constitutional: He is oriented to person, place, and time. He appears well-developed and well-nourished.  HENT:  Head: Normocephalic and atraumatic.  Right Ear: External ear normal.  Left Ear: External ear normal.  Nose: Nose normal.  Mouth/Throat: Oropharynx is clear and moist.  Eyes: Pupils are equal, round, and reactive to light. Conjunctivae and EOM are normal.  Neck: Normal range of motion. Neck supple.  Cardiovascular: Normal rate, regular rhythm, normal heart sounds and intact distal pulses.   Pulmonary/Chest: Effort normal and breath sounds normal.  Abdominal: Soft. Bowel sounds are normal.  Musculoskeletal: Normal range of motion.  Neurological: He is alert and oriented to person, place, and time.  Skin:  Cellulitis to right knee and right thigh.  Lymphangitis up to thigh.  Psychiatric: He has a normal mood and affect. His behavior is normal. Judgment and thought content normal.  Nursing note and vitals reviewed.    ED Treatments / Results  Labs (all labs ordered are listed, but only abnormal results are displayed) Labs Reviewed  CBC WITH DIFFERENTIAL/PLATELET - Abnormal; Notable for the following:       Result Value   RBC 4.04 (*)    Hemoglobin 12.5 (*)    HCT 37.1 (*)    RDW 17.7 (*)    Platelets 125 (*)    All other components within normal limits  CULTURE, BLOOD (ROUTINE X 2)  CULTURE, BLOOD (ROUTINE X 2)  BASIC METABOLIC PANEL  I-STAT CG4 LACTIC ACID, ED    EKG  EKG Interpretation None       Radiology Dg Chest 2 View  Result Date: 01/27/2017 CLINICAL DATA:   Has port on left side was put in July. Pt has rectal cancer. Having swelling in his legs. exsmoker quit in the 90's. Asthma. Bronchitis 15 years ago. Pneumonia 10 years ago. Currently has a fever. EXAM: CHEST  2 VIEW COMPARISON:  04/28/2014 FINDINGS: The cardiac silhouette is normal in size and configuration. No mediastinal or hilar masses. No  evidence of adenopathy. Clear lungs.  No pleural effusion or pneumothorax. There is a right anterior chest wall power Port-A-Cath, new since prior study. Tip lies in the lower superior vena cava. Skeletal structures are unremarkable. IMPRESSION: No active cardiopulmonary disease. Electronically Signed   By: Lajean Manes M.D.   On: 01/27/2017 13:00    Procedures Procedures (including critical care time)  Medications Ordered in ED Medications  vancomycin (VANCOCIN) IVPB 1000 mg/200 mL premix (not administered)  sodium chloride 0.9 % bolus 1,000 mL (1,000 mLs Intravenous New Bag/Given 01/28/17 1427)  ceFAZolin (ANCEF) IVPB 1 g/50 mL premix (1 g Intravenous New Bag/Given 01/28/17 1427)     Initial Impression / Assessment and Plan / ED Course  I have reviewed the triage vital signs and the nursing notes.  Pertinent labs & imaging results that were available during my care of the patient were reviewed by me and considered in my medical decision making (see chart for details).   Pt with cellulitis and on chemo.  WBC is ok, but culture did show 1 + blood cx.  Pt d/w Dr. Rodena Piety ([facility]) for admission.   Final Clinical Impressions(s) / ED Diagnoses   Final diagnoses:  Cellulitis of right lower extremity  Bacteremia  Rectal cancer [facility])    New Prescriptions New Prescriptions   No medications on file     Isla Pence, MD 01/28/17 1523

## 2017-01-28 NOTE — ED Provider Notes (Signed)
Positive blood culture, one of 2. Discuss with Dr. Benay Spice, who will follow with the patient.   Davonna Belling, MD 01/28/17 1151

## 2017-01-28 NOTE — ED Notes (Signed)
Spoke with Magda Paganini from Lab regarding blood culture obtained yesterday from port. +gram cocci auerous, ED MD informed , Dr Alvino Chapel

## 2017-01-28 NOTE — Progress Notes (Signed)
Pharmacy Antibiotic Note  Nicholas Suarez is a 66 y.o. male admitted on 01/28/2017 with bacteremia.  Pharmacy has been consulted for Vancomycin dosing. Known MSSA bactermia from 8/18 ED visit, received Ceftriaxone 2gm x1 & home with prescription for Augmentin. Patient called at home to return for positive blood culture, ID recommendation of Ancef 1gm q8 begun, admitting MD ordered concurrent Vancomycin, anticipate Vancomycin will be stopped 8/20  Plan: Vancomycin 1gm x1 in ED, then 1250mg  IV every 12 hours.  Goal trough 10-15 mcg/mL.  Ancef 1gm q8h    Temp (24hrs), Avg:97.7 F (36.5 C), Min:97.7 F (36.5 C), Max:97.7 F (36.5 C)   Recent Labs Lab 01/27/17 1250 01/28/17 1406 01/28/17 1440  WBC 7.6 10.0  --   CREATININE 1.11 1.02  --   LATICACIDVEN  --   --  1.04    Estimated Creatinine Clearance: 85.1 mL/min (by C-G formula based on SCr of 1.02 mg/dL).    No Known Allergies  Antimicrobials this admission: 8/19 Ancef >>  8/19 Vancomycin >>   Dose adjustments this admission:  Microbiology results: 8/18 BCx: 1/2 BCID MSSA 8/19 BCx: sent   Thank you for allowing pharmacy to be a part of this patient's care.  Minda Ditto 01/28/2017 6:39 PM

## 2017-01-28 NOTE — Telephone Encounter (Signed)
Called and left message on pt's phone that he needs to come back to hospital for 1/2 BCx+ MSSA.  Also asked that he call the hospital operator at [phone] and ask to speak to me.

## 2017-01-29 DIAGNOSIS — D649 Anemia, unspecified: Secondary | ICD-10-CM

## 2017-01-29 DIAGNOSIS — B9561 Methicillin susceptible Staphylococcus aureus infection as the cause of diseases classified elsewhere: Secondary | ICD-10-CM

## 2017-01-29 DIAGNOSIS — C674 Malignant neoplasm of posterior wall of bladder: Secondary | ICD-10-CM

## 2017-01-29 DIAGNOSIS — R7881 Bacteremia: Secondary | ICD-10-CM

## 2017-01-29 DIAGNOSIS — C770 Secondary and unspecified malignant neoplasm of lymph nodes of head, face and neck: Secondary | ICD-10-CM

## 2017-01-29 DIAGNOSIS — D696 Thrombocytopenia, unspecified: Secondary | ICD-10-CM

## 2017-01-29 DIAGNOSIS — L03115 Cellulitis of right lower limb: Principal | ICD-10-CM

## 2017-01-29 DIAGNOSIS — C20 Malignant neoplasm of rectum: Secondary | ICD-10-CM

## 2017-01-29 LAB — BASIC METABOLIC PANEL
Anion gap: 5 (ref 5–15)
BUN: 12 mg/dL (ref 6–20)
CALCIUM: 9.4 mg/dL (ref 8.9–10.3)
CO2: 25 mmol/L (ref 22–32)
Chloride: 108 mmol/L (ref 101–111)
Creatinine, Ser: 1.06 mg/dL (ref 0.61–1.24)
GFR calc Af Amer: >60 mL/min (ref >60)
GLUCOSE: 151 mg/dL — AB (ref 65–99)
Potassium: 4.1 mmol/L (ref 3.5–5.1)
SODIUM: 138 mmol/L (ref 135–145)

## 2017-01-29 LAB — CBC
HEMATOCRIT: 32.3 % — AB (ref 39.0–52.0)
HEMOGLOBIN: 11 g/dL — AB (ref 13.0–17.0)
MCH: 30.8 pg (ref 26.0–34.0)
MCHC: 34.1 g/dL (ref 30.0–36.0)
MCV: 90.5 fL (ref 78.0–100.0)
Platelets: 120 10*3/uL — ABNORMAL LOW (ref 150–400)
RBC: 3.57 MIL/uL — ABNORMAL LOW (ref 4.22–5.81)
RDW: 17.5 % — ABNORMAL HIGH (ref 11.5–15.5)
WBC: 7.2 10*3/uL (ref 4.0–10.5)

## 2017-01-29 MED ORDER — CEFAZOLIN SODIUM-DEXTROSE 2-4 GM/100ML-% IV SOLN
2.0000 g | Freq: Three times a day (TID) | INTRAVENOUS | Status: DC
  Administered 2017-01-29 – 2017-02-01 (×10): 2 g via INTRAVENOUS
  Filled 2017-01-29 (×11): qty 100

## 2017-01-29 MED ORDER — SODIUM CHLORIDE 0.9% FLUSH: 10.0000 mL | INTRAVENOUS | Status: DC | PRN

## 2017-01-29 NOTE — Progress Notes (Addendum)
[facility] PROGRESS NOTE    Progress Note  Nicholas Suarez  ZJI:[id number] DOB: 1950-11-01 DOA: 01/28/2017 PCP: Janith Lima, MD     Brief Narrative:   Nicholas Suarez is an 66 y.o. male past medical history abdominal mesh placement December 2017 followed by multiple rector procedures rectal cancer  with marked lymphadenopathy on chemotherapy with a Port-A-Cath in place with the last therapy on 01/17/2017, called in by infectious disease doctor for 1 out of 2 blood cultures positive for staph. He was here in the ED the day prior to admission and diagnosed with cellulitis.  Assessment/Plan:   Cellulitis and possible MSSA Bactermia: Blood culture ID panel performed on 01/27/2017 was positive for staph. Surveillance culture repeated on 01/29/2017. Total empirically on IV vancomycin and Ancef. His remaining afebrile with no leukocytosis. He does have a Port-A-Cath on the right side, which we have consulted IR for it to removed. Discuss with cardiology scheduled for a 21 2018.  Adenocarcinoma (HCC/  Rectal cancer [facility]) Follow-up with Dr. Malachy Mood is an outpatient.  Normocytic anemia Hemoglobin follow-up with hematology as an outpatient.  Thrombocytopenia ([place]) Likely due to chemotherapy continue to monitor.  DVT prophylaxis: Lovenox3 Family Communication:none Disposition Plan/Barrier to D/C: Unable to determined. Code Status:     Code Status Orders        Start     Ordered   01/28/17 1639  Full code  Continuous     01/28/17 1639    Code Status History    Date Active Date Inactive Code Status Order ID Comments User Context   This patient has a current code status but no historical code status.    Advance Directive Documentation     Most Recent Value  Type of Advance Directive  Healthcare Power of Attorney, Living will  Pre-existing out of facility DNR order (yellow form or pink MOST form)  -  "MOST" Form in Place?  -        IV Access:    Peripheral  IV   Procedures and diagnostic studies:   Dg Chest 2 View  Result Date: 01/27/2017 CLINICAL DATA:  Has port on left side was put in July. Pt has rectal cancer. Having swelling in his legs. exsmoker quit in the 90's. Asthma. Bronchitis 15 years ago. Pneumonia 10 years ago. Currently has a fever. EXAM: CHEST  2 VIEW COMPARISON:  04/28/2014 FINDINGS: The cardiac silhouette is normal in size and configuration. No mediastinal or hilar masses. No evidence of adenopathy. Clear lungs.  No pleural effusion or pneumothorax. There is a right anterior chest wall power Port-A-Cath, new since prior study. Tip lies in the lower superior vena cava. Skeletal structures are unremarkable. IMPRESSION: No active cardiopulmonary disease. Electronically Signed   By: Lajean Manes M.D.   On: 01/27/2017 13:00     Medical Consultants:    None.  Anti-Infectives:   IV vancomycin and Ancef.  Subjective:    Nicholas Suarez   Objective:    Vitals:   01/28/17 1851 01/28/17 2012 01/28/17 2110 01/29/17 0451  BP: (!) 147/68  113/62 115/67  Pulse: 82  93 83  Resp: 16  18 18   Temp:   99.3 F (37.4 C) 98.9 F (37.2 C)  TempSrc:   Oral Oral  SpO2: 100%  99% 99%  Weight:  91.2 kg (201 lb)    Height:  6\' 3"  (1.905 m)      Intake/Output Summary (Last 24 hours) at 01/29/17 1751 Last data filed at  01/29/17 0600  Gross per 24 hour  Intake              350 ml  Output             1000 ml  Net             -650 ml   Filed Weights   01/28/17 2012  Weight: 91.2 kg (201 lb)    Exam: General exam: In no acute distress. Respiratory system: Good air movement and clear to auscultation. Cardiovascular system: Regular rate and rhythm with positive S1-S2 no murmurs rubs gallops Gastrointestinal system: Positive bowel sounds soft nontender nondistended Central nervous system: Awake alert and oriented 3 Extremities: No lower lower edema.. Skin: Skin area has been demarcated Psychiatry: Judgment and insight appear  normal.   Data Reviewed:    Labs: Basic Metabolic Panel:  Recent Labs Lab 01/27/17 1250 01/28/17 1406  NA 137 137  K 4.1 4.5  CL 106 107  CO2 26 24  GLUCOSE 111* 94  BUN 14 20  CREATININE 1.11 1.02  CALCIUM 10.0 9.7   GFR Estimated Creatinine Clearance: 85.1 mL/min (by C-G formula based on SCr of 1.02 mg/dL). Liver Function Tests:  Recent Labs Lab 01/27/17 1250  AST 29  ALT 28  ALKPHOS 57  BILITOT 0.7  PROT 6.4*  ALBUMIN 3.7   No results for input(s): LIPASE, AMYLASE in the last 168 hours. No results for input(s): AMMONIA in the last 168 hours. Coagulation profile No results for input(s): INR, PROTIME in the last 168 hours.  CBC:  Recent Labs Lab 01/27/17 1250 01/28/17 1406  WBC 7.6 10.0  NEUTROABS 5.9 7.7  HGB 12.5* 12.5*  HCT 36.4* 37.1*  MCV 91.2 91.8  PLT 123* 125*   Cardiac Enzymes: No results for input(s): CKTOTAL, CKMB, CKMBINDEX, TROPONINI in the last 168 hours. BNP (last 3 results) No results for input(s): PROBNP in the last 8760 hours. CBG: No results for input(s): GLUCAP in the last 168 hours. D-Dimer: No results for input(s): DDIMER in the last 72 hours. Hgb A1c: No results for input(s): HGBA1C in the last 72 hours. Lipid Profile: No results for input(s): CHOL, HDL, LDLCALC, TRIG, CHOLHDL, LDLDIRECT in the last 72 hours. Thyroid function studies: No results for input(s): TSH, T4TOTAL, T3FREE, THYROIDAB in the last 72 hours.  Invalid input(s): FREET3 Anemia work up: No results for input(s): VITAMINB12, FOLATE, FERRITIN, TIBC, IRON, RETICCTPCT in the last 72 hours. Sepsis Labs:  Recent Labs Lab 01/27/17 1250 01/28/17 1406 01/28/17 1440  WBC 7.6 10.0  --   LATICACIDVEN  --   --  1.04   Microbiology Recent Results (from the past 240 hour(s))  Culture, blood (single)     Status: None (Preliminary result)   Collection Time: 01/27/17 12:50 PM  Result Value Ref Range Status   Specimen Description BLOOD PORTA CATH  Final    Special Requests   Final    BOTTLES DRAWN AEROBIC AND ANAEROBIC Blood Culture adequate volume   Culture  Setup Time   Final    GRAM POSITIVE COCCI IN CLUSTERS AEROBIC BOTTLE ONLY CRITICAL RESULT CALLED TO, READ BACK BY AND VERIFIED WITH: S GOUGE,RN AT 1123 01/28/17 BY L BENFIELD Performed at [facility], [place] [address] [place], [place] [address]    Culture [facility] POSITIVE COCCI  Final   Report Status PENDING  Incomplete  Blood Culture ID Panel (Reflexed)     Status: Abnormal   Collection Time: 01/27/17 12:50 PM  Result Value Ref Range Status   Enterococcus species NOT DETECTED NOT DETECTED Final   Vancomycin resistance NOT DETECTED NOT DETECTED Final   Listeria monocytogenes NOT DETECTED NOT DETECTED Final   Staphylococcus species DETECTED (A) NOT DETECTED Final    Comment: CRITICAL RESULT CALLED TO, READ BACK BY AND VERIFIED WITH: S GOUGE,RN AT 1123 01/28/17 BY L BENFIELD    Staphylococcus aureus DETECTED (A) NOT DETECTED Final    Comment: CRITICAL RESULT CALLED TO, READ BACK BY AND VERIFIED WITH: S GOUGE,RN AT 1123 01/28/17 BY L BENFIELD    Methicillin resistance NOT DETECTED NOT DETECTED Final   Streptococcus species NOT DETECTED NOT DETECTED Final   Streptococcus agalactiae NOT DETECTED NOT DETECTED Final   Streptococcus pneumoniae NOT DETECTED NOT DETECTED Final   Streptococcus pyogenes NOT DETECTED NOT DETECTED Final   Acinetobacter baumannii NOT DETECTED NOT DETECTED Final   Enterobacteriaceae species NOT DETECTED NOT DETECTED Final   Enterobacter cloacae complex NOT DETECTED NOT DETECTED Final   Escherichia coli NOT DETECTED NOT DETECTED Final   Klebsiella oxytoca NOT DETECTED NOT DETECTED Final   Klebsiella pneumoniae NOT DETECTED NOT DETECTED Final   Proteus species NOT DETECTED NOT DETECTED Final   Serratia marcescens NOT DETECTED NOT DETECTED Final   Carbapenem resistance NOT DETECTED NOT DETECTED Final   Haemophilus influenzae NOT DETECTED NOT DETECTED Final    Neisseria meningitidis NOT DETECTED NOT DETECTED Final   Pseudomonas aeruginosa NOT DETECTED NOT DETECTED Final   Candida albicans NOT DETECTED NOT DETECTED Final   Candida glabrata NOT DETECTED NOT DETECTED Final   Candida krusei NOT DETECTED NOT DETECTED Final   Candida parapsilosis NOT DETECTED NOT DETECTED Final   Candida tropicalis NOT DETECTED NOT DETECTED Final    Comment: Performed at [facility], [address] [place], [place] [address]  Culture, blood (single)     Status: None (Preliminary result)   Collection Time: 01/27/17 12:51 PM  Result Value Ref Range Status   Specimen Description BLOOD LEFT ANTECUBITAL  Final   Special Requests   Final    BOTTLES DRAWN AEROBIC AND ANAEROBIC Blood Culture adequate volume   Culture   Final    NO GROWTH < 24 HOURS Performed at [facility], [place] [address] [place], [place] [address]    Report Status PENDING  Incomplete     Medications:   . aspirin EC  325 mg Oral Q72H  . enoxaparin (LOVENOX) injection  40 mg Subcutaneous Q24H   Continuous Infusions: .  ceFAZolin (ANCEF) IV 1 g (01/29/17 0530)      LOS: 1 day   Charlynne Cousins  [facility] Pager [phone]  *Please refer to [email or web address], password [email or web address] to get updated schedule on who will round on this patient, as hospitalists switch teams weekly. If 7PM-7AM, please contact night-coverage at [email or web address], password [email or web address] for any overnight needs.  01/29/2017, 8:19 AM

## 2017-01-29 NOTE — Progress Notes (Signed)
    [facility] for Infectious Disease    Date of Admission:  01/28/2017   Total days of antibiotics 3        Day 2 cefazolin          ID: Nicholas Suarez is a 66 y.o. male with a history of metastatic rectal cancer s/p FOLFOX chemotherapy,completed cycle 4 on 01/17/2017. On 01/27/2017 he developed right leg cellulitis with fever, initially discharged on amox/clav but then found to have MSSA bacteremia ,including bcx from Port-A-Cath 01/27/2017 +MSSA Principal Problem:   Cellulitis Active Problems:   Adenocarcinoma ([place])   Rectal cancer ([place])   Normocytic anemia   Thrombocytopenia ([facility])   MSSA bacteremia   Bacteremia    Subjective: Afebrile  TEE scheduled for 8/21 Medications:  . aspirin EC  325 mg Oral Q72H  . enoxaparin (LOVENOX) injection  40 mg Subcutaneous Q24H    Objective: Vital signs in last 24 hours: Temp:  [98.9 F (37.2 C)-99.3 F (37.4 C)] 99.3 F (37.4 C) (08/20 1415) Pulse Rate:  [82-93] 86 (08/20 1415) Resp:  [16-18] 18 (08/20 1415) BP: (108-147)/(62-68) 113/64 (08/20 1415) SpO2:  [98 %-100 %] 98 % (08/20 1415) Weight:  [201 lb (91.2 kg)] 201 lb (91.2 kg) (08/19 2012)  Physical Exam  Constitutional: He is oriented to person, place, and time. He appears well-developed and well-nourished. No distress.  HENT:  Mouth/Throat: Oropharynx is clear and moist. No oropharyngeal exudate.  Cardiovascular: Normal rate, regular rhythm and normal heart sounds. Exam reveals no gallop and no friction rub.  No murmur heard.  Pulmonary/Chest: Effort normal and breath sounds normal. No respiratory distress. He has no wheezes.  Chest wall: right porta cath is nontender Abdominal: Soft. Bowel sounds are normal. He exhibits no distension. There is no tenderness. No tenderness along umbilicus (hx of mesh) Ext: right thigh has scattered petechaie and still warmth/redness to lateral superior tibial region Skin: Skin is warm and dry. No rash noted. No erythema.  Psychiatric:  He has a normal mood and affect. His behavior is normal.     Lab Results  Recent Labs  01/28/17 1406 01/29/17 0835  WBC 10.0 7.2  HGB 12.5* 11.0*  HCT 37.1* 32.3*  NA 137 138  K 4.5 4.1  CL 107 108  CO2 24 25  BUN 20 12  CREATININE 1.02 1.06   Liver Panel  Recent Labs  01/27/17 1250  PROT 6.4*  ALBUMIN 3.7  AST 29  ALT 28  ALKPHOS 57  BILITOT 0.7   Sedimentation Rate No results for input(s): ESRSEDRATE in the last 72 hours. C-Reactive Protein No results for input(s): CRP in the last 72 hours.  Microbiology: 8/20 blood cx pending 8/19 blood cx NGTD 8/18 1 of 4 blood cx MSSA  Studies/Results: No results found.   Assessment/Plan: 66yo M immunocompromised host getting chemo via portacath admitted for cellulitis and found to have MSSA bacteremia - recommend removal of portacath as concern that bacteria has already created biofilm about the apparatus - await TEE to help decide on short course vs. Long course of IV abtx - repeat blood cx today and the day prior are pending  Right leg erythema/cellulitis = concern for nidus of infection? Septic thrombus? U/S pending  Baxter Flattery [facility] for Infectious Diseases Cell: [phone] Pager: [phone]  01/29/2017, 4:01 PM

## 2017-01-29 NOTE — Progress Notes (Signed)
    [facility] has been requested to perform a transesophageal echocardiogram on Nicholas Suarez for rule out endocarditis.  After careful review of history and examination, the risks and benefits of transesophageal echocardiogram have been explained including risks of esophageal damage, perforation (1:10,000 risk), bleeding, pharyngeal hematoma as well as other potential complications associated with conscious sedation including aspiration, arrhythmia, respiratory failure and death. Alternatives to treatment were discussed, questions were answered. Patient is willing to proceed.   TEE scheduled for 01/30/17 at 10:00 to be done by Dr. Acie Fredrickson.  Daune Perch, NP  01/29/2017 9:31 AM

## 2017-01-29 NOTE — Progress Notes (Signed)
IP PROGRESS NOTE  Subjective:   Mr. Ferrell is known to me with a history of metastatic rectal cancer. He is being treated with FOLFOX chemotherapy. He completed cycle 4 on 01/17/2017. He reports feeling well until 01/27/2017 when he developed pain and a rash in the right lower extremities. He had a fever and was seen in the emergency room. He was diagnosed with a right lower extremity cellulitis. He was placed on Augmentin. The leg now feels better. A blood culture from the Port-A-Cath 01/27/2017 returned positive for staph aureus. He was admitted yesterday for further evaluation. He reports the right leg feels better. No other complaint.  Objective: Vital signs in last 24 hours: Blood pressure 115/67, pulse 83, temperature 98.9 F (37.2 C), temperature source Oral, resp. rate 18, height '6\' 3"'  (1.905 m), weight 201 lb (91.2 kg), SpO2 99 %.  Intake/Output from previous day: 08/19 0701 - 08/20 0700 In: 350 [IV Piggyback:350] Out: 1000 [Urine:1000]  Physical Exam:  HEENT: No thrush or ulcers Lungs: Clear bilaterally Cardiac: Regular rate and rhythm Abdomen: No hepatosplenomegaly, no mass, nontender Extremities: No leg edema Skin: Confluent erythema at the mid lateral right leg, scabbed ulcer at the right pretibial area  Portacath/PICC-without erythema or tenderness  Lab Results:  Recent Labs  01/28/17 1406 01/29/17 0835  WBC 10.0 7.2  HGB 12.5* 11.0*  HCT 37.1* 32.3*  PLT 125* 120*    BMET  Recent Labs  01/28/17 1406 01/29/17 0835  NA 137 138  K 4.5 4.1  CL 107 108  CO2 24 25  GLUCOSE 94 151*  BUN 20 12  CREATININE 1.02 1.06  CALCIUM 9.7 9.4    Lab Results  Component Value Date   CEA1 47.88 (H) 01/17/2017     Medications: I have reviewed the patient's current medications.  Assessment/Plan: 1. Rectal cancer  MRI lumbar spine 10/20/2016 with bilateral hydronephrosis and retroperitoneal adenopathy  CT abdomen/pelvis 10/20/2016 with extensive  retroperitoneal process with marked interstitial thickening and lymphadenopathy; bilateral hydronephrosis; similar ill-defined soft tissue density and skin thickening around the umbilicus; diffuse bladder wall thickening slightly asymmetric at the bladder dome but no obvious bladder mass  Biopsy para-aortic lymph node 11/10/2016-metastatic adenocarcinoma consistent with GI primary  11/23/2016 CEA elevated, CA-19-9 normal  Colonoscopy 11/27/2016-fungating infiltrative nonobstructing mass in the distal rectum involving the anal verge. The rectum was fixed and frozen in the pelvis precluding passage of scope beyond the distal sigmoid colon. 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  Foundation 1-microsatellite stable; tumor mutational burden 5; NOTCH3 amplification; NOTCH3 rearrangement exon 14  Upper endoscopy 11/27/2016-normal.  PET scan 06/22/2018showed minimal activity in the primary rectal carcinoma, diffuse wall thickening; mild activity associate with retroperitoneal lymph nodes; persistent haziness within the retroperitoneum. No evidence of liver metastasis or distant metastasis.  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  Cycle 2 FOLFOX 12/20/2016  Cycle 3 FOLFOX 01/03/2017 (oxaliplatin dose reduced secondary to thrombocytopenia)   Cycle 4 FOLFOX 01/17/2017 (oxaliplatin held due to thrombocytopenia) 2. Retroperitoneal lymphadenopathy secondary to #1 3. Bilateral hydronephrosis status post evaluation by urology 4. Asymmetry of the bladder dome on CT 10/20/2016 status post cystoscopy with biopsy planned 12/11/2016 (Dr. 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 Change in bowel habits,  secondary to #1-improved. 6. Right leg edema, question due to lymphatic obstruction;venous Doppler negative  for DVT 11/23/2016. 7. Port-A-Cath placement 12/05/2016 8. Thrombocytopenia secondary to chemotherapy; oxaliplatin dose reduced with cycle 3 FOLFOX. Progressive thrombocytopenia 01/17/2017; oxaliplatin held 01/17/2017. 9. Right lower extremity cellulitis 01/27/2017-blood culture positive for methicillin sensitive staph aureus   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 He is being treated with FOLFOX chemotherapy. He has completed 4 cycles of chemotherapy with clinical and CEA improvement. The plan is to schedule a restaging CT after cycle 5.  He developed a right leg cellulitis 01/27/2017 with staph aureus bacteremia. The Port-A-Cath does not appear clinically infected.  Mr. Grieger appears well.  Recommendations: 1. Management of cellulitis/bacteremia per infectious disease 2. Plan to continue FOLFOX chemotherapy, we will reschedule the 01/31/2017 chemotherapy for 02/07/2017 3. Please call Oncology as needed    LOS: 1 day   Donneta Romberg, MD   01/29/2017, 1:47 PM

## 2017-01-29 NOTE — Progress Notes (Signed)
Patient scheduled for TEE @ 1000 on Tuesday, 8.21.18. Spoke with Laveda Abbe, RN about setting up [facility] for transport. Patient needs to arrive @ 0830 for procedure.

## 2017-01-30 ENCOUNTER — Ambulatory Visit (HOSPITAL_COMMUNITY): Admission: RE | Admit: 2017-01-30 | Payer: Medicare Other | Source: Ambulatory Visit | Admitting: Cardiovascular Disease

## 2017-01-30 ENCOUNTER — Encounter (HOSPITAL_COMMUNITY)
Admission: EM | Disposition: A | Discharge status: Home Health | Payer: Self-pay | Source: Home / Self Care | Attending: Internal Medicine

## 2017-01-30 ENCOUNTER — Encounter (HOSPITAL_COMMUNITY): Payer: Self-pay | Admitting: *Deleted

## 2017-01-30 ENCOUNTER — Inpatient Hospital Stay (HOSPITAL_COMMUNITY): Payer: Medicare Other

## 2017-01-30 DIAGNOSIS — R7881 Bacteremia: Secondary | ICD-10-CM

## 2017-01-30 HISTORY — PX: TEE WITHOUT CARDIOVERSION: SHX5443

## 2017-01-30 LAB — CULTURE, BLOOD (SINGLE): Special Requests: ADEQUATE

## 2017-01-30 SURGERY — ECHOCARDIOGRAM, TRANSESOPHAGEAL
Anesthesia: Moderate Sedation

## 2017-01-30 MED ORDER — MIDAZOLAM HCL 10 MG/2ML IJ SOLN
INTRAMUSCULAR | Status: DC | PRN
  Administered 2017-01-30: 1 mg via INTRAVENOUS
  Administered 2017-01-30 (×2): 2 mg via INTRAVENOUS

## 2017-01-30 MED ORDER — BUTAMBEN-TETRACAINE-BENZOCAINE 2-2-14 % EX AERO
INHALATION_SPRAY | CUTANEOUS | Status: DC | PRN
  Administered 2017-01-30: 2 via TOPICAL

## 2017-01-30 MED ORDER — ENOXAPARIN SODIUM 40 MG/0.4ML ~~LOC~~ SOLN
40.0000 mg | SUBCUTANEOUS | Status: DC
  Administered 2017-01-31: 40 mg via SUBCUTANEOUS
  Filled 2017-01-30: qty 0.4

## 2017-01-30 MED ORDER — FENTANYL CITRATE (PF) 100 MCG/2ML IJ SOLN
INTRAMUSCULAR | Status: DC | PRN
  Administered 2017-01-30 (×3): 25 ug via INTRAVENOUS

## 2017-01-30 NOTE — Progress Notes (Signed)
[place] [facility] for Infectious Disease    Date of Admission:  01/28/2017   Total days of antibiotics 4        Day 3 cefazolin           ID: Nicholas Suarez is a 66 y.o. male with history of metastatic rectal cancer s/p FOLFOX chemotherapy,completed cycle 4 on 01/17/2017. On 01/27/2017 he developed right leg cellulitis with fever, initially discharged on amox/clav but then found to have MSSA bacteremia ,including bcx from Port-A-Cath 01/27/2017 +MSSA Principal Problem:   Cellulitis Active Problems:   Adenocarcinoma ([place])   Rectal cancer ([place])   Normocytic anemia   Thrombocytopenia ([facility])   MSSA bacteremia   Bacteremia    Subjective: Underwent TEE which did not show any vegetations. Remains afebrile. Sleepy post procedure  Medications:  . aspirin EC  325 mg Oral Q72H  . [START ON 01/31/2017] enoxaparin (LOVENOX) injection  40 mg Subcutaneous Q24H    Objective: Vital signs in last 24 hours: Temp:  [97.8 F (36.6 C)-98.2 F (36.8 C)] 97.8 F (36.6 C) (08/21 1216) Pulse Rate:  [76-88] 80 (08/21 1216) Resp:  [11-22] 18 (08/21 1216) BP: (102-130)/(55-88) 122/72 (08/21 1216) SpO2:  [95 %-100 %] 100 % (08/21 1216) Physical Exam  Constitutional: He is oriented to person, place, and time. He appears well-developed and well-nourished. No distress.  HENT:  Mouth/Throat: Oropharynx is clear and moist. No oropharyngeal exudate.  Cardiovascular: Normal rate, regular rhythm and normal heart sounds. Exam reveals no gallop and no friction rub.  No murmur heard.  Pulmonary/Chest: Effort normal and breath sounds normal. No respiratory distress. He has no wheezes.  Chest wall = no tenderness at port site  Neurological: He is alert and oriented to person, place, and time.  Skin: Skin is warm and dry. No rash noted. No erythema.  Psychiatric: He has a normal mood and affect. His behavior is normal.    Lab Results  Recent Labs  01/28/17 1406 01/29/17 0835  WBC 10.0 7.2  HGB 12.5*  11.0*  HCT 37.1* 32.3*  NA 137 138  K 4.5 4.1  CL 107 108  CO2 24 25  BUN 20 12  CREATININE 1.02 1.06    Microbiology: 8/20 blood cx pending 8/19 blood cx NGTD 8/18 1 of 4 blood cx MSSA   Assessment/Plan: MSSA bacteremia = likely isolated to portacath. Recommend removal of portacath and can place picc line the next day. Plan to treat for simple bacteremia with a plan of 2 wk using day 1 as day of portacath removal.       [place] [facility] Team Staphylococcus aureus bacteremia   Staphylococcus aureus bacteremia (SAB) is associated with a high rate of complications and mortality.  Specific aspects of clinical management are critical to optimizing the outcome of patients with SAB.  Therefore, the [facility] Antimicrobial Management Team [facility]) has initiated an intervention aimed at improving the management of SAB at [facility].  To do so, Infectious Diseases physicians are providing an evidence-based consult for the management of all patients with SAB.     Yes No Comments  Perform follow-up blood cultures (even if the patient is afebrile) to ensure clearance of bacteremia [x]  []  9/19 NGTD  Remove vascular catheter and obtain follow-up blood cultures after the removal of the catheter []  []  NEED TO REMOVE PORT  Perform echocardiography to evaluate for endocarditis (transthoracic ECHO is 40-50% sensitive, TEE is > 90% sensitive) [x]  []  TEE on 8/21 is NEGATIVE  Change antibiotic therapy to ___cefazolin 2gm IV q 8hr____ []  []    Estimated duration of IV antibiotic therapy:  2 wk []  []  Consult case management for probably prolonged outpatient IV antibiotic therapy     [facility] for Infectious Diseases Cell: [phone] Pager: [phone]  01/30/2017, 4:21 PM

## 2017-01-30 NOTE — Progress Notes (Signed)
  Echocardiogram Echocardiogram Transesophageal has been performed.  Nicholas Suarez 01/30/2017, 11:50 AM

## 2017-01-30 NOTE — Interval H&P Note (Signed)
History and Physical Interval Note:  01/30/2017 10:33 AM  Nicholas Suarez  has presented today for surgery, with the diagnosis of bacteremia  The various methods of treatment have been discussed with the patient and family. After consideration of risks, benefits and other options for treatment, the patient has consented to  Procedure(s): TRANSESOPHAGEAL ECHOCARDIOGRAM (TEE) (N/A) as a surgical intervention .  The patient's history has been reviewed, patient examined, no change in status, stable for surgery.  I have reviewed the patient's chart and labs.  Questions were answered to the patient's satisfaction.     Mertie Moores

## 2017-01-30 NOTE — Consult Note (Signed)
Chief Complaint: Patient was seen in consultation today for bacteremia  Referring Physician(s): Dr. Aileen Fass  Supervising Physician: Marybelle Killings  Patient Status: [facility] - In-pt  History of Present Illness: Nicholas Suarez is a 66 y.o. male with past medical history of rectal adenocarcinoma with metastasis on chemotherapy via Port-A-Cath originally placed by Dr. Vernard Gambles 12/05/16 presents with lower extremity cellulitis.  Blood cultures positive for MSSA.  Patient admitted for IV abx.   IR consulted for Port-A-Cath removal in the setting of bacteremia.   Past Medical History:  Diagnosis Date  . Allergic rhinitis, seasonal   . Anemia   . Bilateral hydrocele   . Bilateral hydronephrosis   . Chronic constipation   . Edema of right lower extremity   . Rectal cancer ([place]) dx 11/27/2016 via colonscopy w/ bx   oncolgoist-  dr Benay Spice--  rectal adenocarcinoma w/ metastatic retroperitoneal lymphadenopathy  . Wears glasses     Past Surgical History:  Procedure Laterality Date  . COLONOSCOPY WITH ESOPHAGOGASTRODUODENOSCOPY (EGD)  11-27-2016  dr Earlean Shawl   rectal mass  . CYSTOSCOPY WITH BIOPSY N/A 12/11/2016   Procedure: CYSTOSCOPY WITH BIOPSY;  Surgeon: Kathie Rhodes, MD;  Location: [facility];  Service: Urology;  Laterality: N/A;  . IR FLUORO GUIDE PORT INSERTION RIGHT  12/05/2016  . IR US GUIDE VASC ACCESS RIGHT  12/05/2016  . UMBILICAL HERNIA REPAIR  05/2016    Allergies: Patient has no known allergies.  Medications: Prior to Admission medications   Medication Sig Start Date End Date Taking? Authorizing Provider  amoxicillin-clavulanate (AUGMENTIN) 875-125 MG tablet Take 1 tablet by mouth 2 (two) times daily. 01/27/17  Yes Tanna Furry, MD  aspirin 325 MG tablet Take 325 mg by mouth every 3 (three) days.    Yes [provider]  glucosamine-chondroitin 500-400 MG tablet Take 1 tablet by mouth every 3 (three) days.    Yes [provider]  ibuprofen  (ADVIL,MOTRIN) 200 MG tablet Take 400 mg by mouth every 6 (six) hours as needed for moderate pain.   Yes [provider]  lidocaine-prilocaine (EMLA) cream APPLY TO PORT SITE 1 HOUR PRIOR TO USE 11/30/16  Yes [name], NP  Multiple Vitamin (MULTIVITAMIN) tablet Take 1 tablet by mouth every 3 (three) days.    Yes [provider]  Omega-3 Fatty Acids (FISH OIL) 1200 MG CAPS Take by mouth every 3 (three) days.    Yes [provider]  Polyethylene Glycol 3350 (MIRALAX PO) Take by mouth daily.   Yes [provider]  prochlorperazine (COMPAZINE) 10 MG tablet Take 1 tablet (10 mg total) by mouth every 6 (six) hours as needed for nausea or vomiting. 11/30/16  Yes [name], NP  saw palmetto 80 MG capsule Take 80 mg by mouth every 3 (three) days.    Yes [provider]     Family History  Problem Relation Age of Onset  . Breast cancer Mother   . Alcohol abuse Father   . Heart disease Other   . Cancer Other        Breast Cancer  . Arthritis Other   . Alcohol abuse Other   . Drug abuse Other   . Stroke Neg Hx   . Kidney disease Neg Hx   . Hypertension Neg Hx   . Hyperlipidemia Neg Hx   . Early death Neg Hx   . Colon cancer Neg Hx   . Esophageal cancer Neg Hx   . Rectal cancer  Neg Hx   . Stomach cancer Neg Hx     Social History   Social History  . Marital status: Married    Spouse name: N/A  . Number of children: N/A  . Years of education: N/A   Social History Main Topics  . Smoking status: Former Smoker    Packs/day: 1.00    Years: 10.00    Types: Cigarettes, Pipe, Cigars    Quit date: 06/12/1988  . Smokeless tobacco: Former Systems developer    Types: Chew    Quit date: 06/12/1993  . Alcohol use No  . Drug use: No  . Sexual activity: Yes   Other Topics Concern  . None   Social History Narrative  . None    Review of Systems: A 12 point ROS discussed and pertinent positives are indicated in the HPI above.  All other systems are  negative.  Review of Systems  Constitutional: Positive for fever. Negative for fatigue.  Respiratory: Negative for cough and shortness of breath.   Cardiovascular: Negative for chest pain.  Skin: Positive for rash.  Psychiatric/Behavioral: Negative for behavioral problems and confusion.    Vital Signs: BP 122/72 (BP Location: Right Arm)   Pulse 80   Temp 97.8 F (36.6 C) (Oral)   Resp 18   Ht 6\' 3"  (1.905 m)   Wt 201 lb (91.2 kg)   SpO2 100%   BMI 25.12 kg/m   Physical Exam  Constitutional: He is oriented to person, place, and time. He appears well-developed.  Cardiovascular: Normal rate, regular rhythm and normal heart sounds.   Pulmonary/Chest: Effort normal and breath sounds normal. No respiratory distress.  Neurological: He is alert and oriented to person, place, and time.  Skin: Skin is warm and dry.  Psychiatric: He has a normal mood and affect. His behavior is normal. Judgment and thought content normal.  Nursing note and vitals reviewed.   Mallampati Score:  MD Evaluation Airway: WNL Heart: WNL Abdomen: WNL Chest/ Lungs: WNL ASA  Classification: 3 Mallampati/Airway Score: Two  Imaging: Dg Chest 2 View  Result Date: 01/27/2017 CLINICAL DATA:  Has port on left side was put in July. Pt has rectal cancer. Having swelling in his legs. exsmoker quit in the 90's. Asthma. Bronchitis 15 years ago. Pneumonia 10 years ago. Currently has a fever. EXAM: CHEST  2 VIEW COMPARISON:  04/28/2014 FINDINGS: The cardiac silhouette is normal in size and configuration. No mediastinal or hilar masses. No evidence of adenopathy. Clear lungs.  No pleural effusion or pneumothorax. There is a right anterior chest wall power Port-A-Cath, new since prior study. Tip lies in the lower superior vena cava. Skeletal structures are unremarkable. IMPRESSION: No active cardiopulmonary disease. Electronically Signed   By: Lajean Manes M.D.   On: 01/27/2017 13:00    Labs:  CBC:  Recent Labs   01/17/17 1047 01/27/17 1250 01/28/17 1406 01/29/17 0835  WBC 3.1* 7.6 10.0 7.2  HGB 11.6* 12.5* 12.5* 11.0*  HCT 34.8* 36.4* 37.1* 32.3*  PLT 68* 123* 125* 120*    COAGS:  Recent Labs  11/10/16 0713 12/05/16 1134 12/14/16 1117  INR 0.98 1.07 1.07  APTT 29 28 29     BMP:  Recent Labs  11/01/16 0949  01/17/17 1047 01/27/17 1250 01/28/17 1406 01/29/17 0835  NA 142  < > 138 137 137 138  K 4.8  < > 4.3 4.1 4.5 4.1  CL 107  --   --  106 107 108  CO2 29  < >  25 26 24 25   GLUCOSE 105*  < > 103 111* 94 151*  BUN 16  < > 14.5 14 20 12   CALCIUM 10.4  < > 10.1 10.0 9.7 9.4  CREATININE 1.16  < > 1.0 1.11 1.02 1.06  GFRNONAA  --   --   --  >60 >60 >60  GFRAA  --   --   --  >60 >60 >60  < > = values in this interval not displayed.  LIVER FUNCTION TESTS:  Recent Labs  12/20/16 1036 01/03/17 1032 01/17/17 1047 01/27/17 1250  BILITOT 0.41 0.53 0.68 0.7  AST 18 37* 41* 29  ALT 15 42 44 28  ALKPHOS 81 88 76 57  PROT 6.5 6.1* 6.0* 6.4*  ALBUMIN 3.7 3.4* 3.4* 3.7    TUMOR MARKERS: No results for input(s): AFPTM, CEA, CA199, CHROMGRNA in the last 8760 hours.  Assessment and Plan: Bacteremia Patient with past medical history of metastatic rectal cancer presents with complaint of fever, celluitis. Patient blood cultured positive for MSSA.  IR consulted for Port-A-Cath removal at the request of Dr. Aileen Fass. Anticipate procedure tomorrow.  Patient will be made NPO after midnight.  Blood thinners held.  Risks and benefits discussed with the patient including, but not limited to bleeding, infection. All of the patient's questions were answered, patient is agreeable to proceed. Consent signed and in chart.  Thank you for this interesting consult.  I greatly enjoyed meeting CALTON HARSHFIELD and look forward to participating in their care.  A copy of this report was sent to the requesting provider on this date.  Electronically Signed: Docia Barrier,  PA 01/30/2017, 5:09 PM   I spent a total of 40 Minutes    in face to face in clinical consultation, greater than 50% of which was counseling/coordinating care for bacteremia.

## 2017-01-30 NOTE — CV Procedure (Signed)
    Transesophageal Echocardiogram Note  Nicholas Suarez [id number] Apr 15, 1951  Procedure: Transesophageal Echocardiogram Indications: bacteremia   Procedure Details Consent: Obtained Time Out: Verified patient identification, verified procedure, site/side was marked, verified correct patient position, special equipment/implants available, Radiology Safety Procedures followed,  medications/allergies/relevent history reviewed, required imaging and test results available.  Performed  Medications:  During this procedure the patient is administered a total of Versed 5  mg and Fentanyl 75 mcg  to achieve and maintain moderate conscious sedation.  The patient's heart rate, blood pressure, and oxygen saturation are monitored continuously during the procedure. The period of conscious sedation is 30 minutes, of which I was present face-to-face 100% of this time.  Left Ventrical:  Normal LV function   Mitral Valve: normal   Aortic Valve: normal   Tricuspid Valve: normal   Pulmonic Valve: normal   Left Atrium/ Left atrial appendage: no thrombi   Atrial septum: intact by color doppler   Aorta: normal    Complications: No apparent complications Patient did tolerate procedure well.   Thayer Headings, Brooke Bonito., MD, [facility] 01/30/2017, 10:57 AM

## 2017-01-30 NOTE — Progress Notes (Addendum)
MSSA bacteremia ,includingbcx fromPort-A-Cath 01/27/2017 +[facility]    Progress Note  Nicholas Suarez  [id number]:[id number] DOB: May 19, 1951 DOA: 01/28/2017 PCP: Janith Lima, MD     Brief Narrative:   Nicholas Suarez is an 66 y.o. male past medical history abdominal mesh placement December 2017 followed by multiple rector procedures rectal cancer  with marked lymphadenopathy on chemotherapy with a Port-A-Cath in place with the last therapy on 01/17/2017, called in by infectious disease doctor for 1 out of 2 blood cultures positive for staph. He was here in the ED the day prior to admission and diagnosed with cellulitis. MSSA bacteremia ,includingbcx fromPort-A-Cath 01/27/2017 +MSSA  Assessment/Plan:   Cellulitis and possible MSSA Bactermia: Blood culture ID panel performed on 01/27/2017 was positive for staph. TEE performed on 8.21.2018 Surveillance culture repeated on 01/29/2017. Empirically on IV Ancef. He has. remaining afebrile with no leukocytosis. He does have a Port-A-Cath on the right side, which we have consulted IR for it to removed.8.21 2018. Will need to be d/c with PICC line afer benig 24 hrs without central lines t  Adenocarcinoma (HCC/  Rectal cancer [facility]) Follow-up with Dr. Malachy Mood is an outpatient.  Normocytic anemia Hemoglobin follow-up with hematology as an outpatient.  Thrombocytopenia ([place]) Likely due to chemotherapy continue to monitor.  DVT prophylaxis: Lovenox3 Family Communication:none Disposition Plan/Barrier to D/C: Unable to determined. Code Status:     Code Status Orders        Start     Ordered   01/28/17 1639  Full code  Continuous     01/28/17 1639    Code Status History    Date Active Date Inactive Code Status Order ID Comments User Context   This patient has a current code status but no historical code status.    Advance Directive Documentation     Most Recent Value  Type of Advance Directive  Healthcare  Power of Attorney, Living will  Pre-existing out of facility DNR order (yellow form or pink MOST form)  -  "MOST" Form in Place?  -        IV Access:    Peripheral IV   Procedures and diagnostic studies:   No results found.   Medical Consultants:    None.  Anti-Infectives:   IV vancomycin and Ancef.  Subjective:    Nicholas Suarez No complains  Objective:    Vitals:   01/30/17 1112 01/30/17 1113 01/30/17 1120 01/30/17 1127  BP: 117/72  120/85 113/80  Pulse: 78 82 88 79  Resp: 13 16 16 17   Temp: 97.8 F (36.6 C)     TempSrc: Oral     SpO2: 97% 96% 100% 97%  Weight:      Height:        Intake/Output Summary (Last 24 hours) at 01/30/17 1132 Last data filed at 01/30/17 1000  Gross per 24 hour  Intake             1010 ml  Output             2100 ml  Net            -1090 ml   Filed Weights   01/28/17 2012  Weight: 91.2 kg (201 lb)    Exam: General exam: In no acute distress. Respiratory system: Good air movement and clear to auscultation. Cardiovascular system: Regular rate and rhythm with positive S1-S2 no murmurs rubs gallops Gastrointestinal system: Positive bowel sounds soft nontender nondistended Central nervous system: Awake  alert and oriented 3 Extremities: No lower lower edema.. Skin: Skin area has been demarcated Psychiatry: Judgment and insight appear normal.   Data Reviewed:    Labs: Basic Metabolic Panel:  Recent Labs Lab 01/27/17 1250 01/28/17 1406 01/29/17 0835  NA 137 137 138  K 4.1 4.5 4.1  CL 106 107 108  CO2 26 24 25   GLUCOSE 111* 94 151*  BUN 14 20 12   CREATININE 1.11 1.02 1.06  CALCIUM 10.0 9.7 9.4   GFR Estimated Creatinine Clearance: 81.9 mL/min (by C-G formula based on SCr of 1.06 mg/dL). Liver Function Tests:  Recent Labs Lab 01/27/17 1250  AST 29  ALT 28  ALKPHOS 57  BILITOT 0.7  PROT 6.4*  ALBUMIN 3.7   No results for input(s): LIPASE, AMYLASE in the last 168 hours. No results for input(s):  AMMONIA in the last 168 hours. Coagulation profile No results for input(s): INR, PROTIME in the last 168 hours.  CBC:  Recent Labs Lab 01/27/17 1250 01/28/17 1406 01/29/17 0835  WBC 7.6 10.0 7.2  NEUTROABS 5.9 7.7  --   HGB 12.5* 12.5* 11.0*  HCT 36.4* 37.1* 32.3*  MCV 91.2 91.8 90.5  PLT 123* 125* 120*   Cardiac Enzymes: No results for input(s): CKTOTAL, CKMB, CKMBINDEX, TROPONINI in the last 168 hours. BNP (last 3 results) No results for input(s): PROBNP in the last 8760 hours. CBG: No results for input(s): GLUCAP in the last 168 hours. D-Dimer: No results for input(s): DDIMER in the last 72 hours. Hgb A1c: No results for input(s): HGBA1C in the last 72 hours. Lipid Profile: No results for input(s): CHOL, HDL, LDLCALC, TRIG, CHOLHDL, LDLDIRECT in the last 72 hours. Thyroid function studies: No results for input(s): TSH, T4TOTAL, T3FREE, THYROIDAB in the last 72 hours.  Invalid input(s): FREET3 Anemia work up: No results for input(s): VITAMINB12, FOLATE, FERRITIN, TIBC, IRON, RETICCTPCT in the last 72 hours. Sepsis Labs:  Recent Labs Lab 01/27/17 1250 01/28/17 1406 01/28/17 1440 01/29/17 0835  WBC 7.6 10.0  --  7.2  LATICACIDVEN  --   --  1.04  --    Microbiology Recent Results (from the past 240 hour(s))  Culture, blood (single)     Status: Abnormal   Collection Time: 01/27/17 12:50 PM  Result Value Ref Range Status   Specimen Description BLOOD PORTA CATH  Final   Special Requests   Final    BOTTLES DRAWN AEROBIC AND ANAEROBIC Blood Culture adequate volume   Culture  Setup Time   Final    GRAM POSITIVE COCCI IN CLUSTERS AEROBIC BOTTLE ONLY CRITICAL RESULT CALLED TO, READ BACK BY AND VERIFIED WITH: S GOUGE,RN AT 1123 01/28/17 BY L BENFIELD Performed at [facility], [place] [address] [place], [place] [address]    Culture STAPHYLOCOCCUS AUREUS (A)  Final   Report Status 01/30/2017 FINAL  Final   Organism ID, Bacteria STAPHYLOCOCCUS AUREUS  Final       Susceptibility   Staphylococcus aureus - MIC*    CIPROFLOXACIN <=0.5 SENSITIVE Sensitive     ERYTHROMYCIN <=0.25 SENSITIVE Sensitive     GENTAMICIN <=0.5 SENSITIVE Sensitive     OXACILLIN <=0.25 SENSITIVE Sensitive     TETRACYCLINE <=1 SENSITIVE Sensitive     VANCOMYCIN <=0.5 SENSITIVE Sensitive     TRIMETH/SULFA <=10 SENSITIVE Sensitive     CLINDAMYCIN <=0.25 SENSITIVE Sensitive     RIFAMPIN <=0.5 SENSITIVE Sensitive     Inducible Clindamycin NEGATIVE Sensitive     * STAPHYLOCOCCUS AUREUS  Blood  Culture ID Panel (Reflexed)     Status: Abnormal   Collection Time: 01/27/17 12:50 PM  Result Value Ref Range Status   Enterococcus species NOT DETECTED NOT DETECTED Final   Vancomycin resistance NOT DETECTED NOT DETECTED Final   Listeria monocytogenes NOT DETECTED NOT DETECTED Final   Staphylococcus species DETECTED (A) NOT DETECTED Final    Comment: CRITICAL RESULT CALLED TO, READ BACK BY AND VERIFIED WITH: S GOUGE,RN AT 1123 01/28/17 BY L BENFIELD    Staphylococcus aureus DETECTED (A) NOT DETECTED Final    Comment: CRITICAL RESULT CALLED TO, READ BACK BY AND VERIFIED WITH: S GOUGE,RN AT 1123 01/28/17 BY L BENFIELD    Methicillin resistance NOT DETECTED NOT DETECTED Final   Streptococcus species NOT DETECTED NOT DETECTED Final   Streptococcus agalactiae NOT DETECTED NOT DETECTED Final   Streptococcus pneumoniae NOT DETECTED NOT DETECTED Final   Streptococcus pyogenes NOT DETECTED NOT DETECTED Final   Acinetobacter baumannii NOT DETECTED NOT DETECTED Final   Enterobacteriaceae species NOT DETECTED NOT DETECTED Final   Enterobacter cloacae complex NOT DETECTED NOT DETECTED Final   Escherichia coli NOT DETECTED NOT DETECTED Final   Klebsiella oxytoca NOT DETECTED NOT DETECTED Final   Klebsiella pneumoniae NOT DETECTED NOT DETECTED Final   Proteus species NOT DETECTED NOT DETECTED Final   Serratia marcescens NOT DETECTED NOT DETECTED Final   Carbapenem resistance NOT DETECTED NOT DETECTED  Final   Haemophilus influenzae NOT DETECTED NOT DETECTED Final   Neisseria meningitidis NOT DETECTED NOT DETECTED Final   Pseudomonas aeruginosa NOT DETECTED NOT DETECTED Final   Candida albicans NOT DETECTED NOT DETECTED Final   Candida glabrata NOT DETECTED NOT DETECTED Final   Candida krusei NOT DETECTED NOT DETECTED Final   Candida parapsilosis NOT DETECTED NOT DETECTED Final   Candida tropicalis NOT DETECTED NOT DETECTED Final    Comment: Performed at [facility], [place]. [address], [place], [place] [address]  Culture, blood (single)     Status: None (Preliminary result)   Collection Time: 01/27/17 12:51 PM  Result Value Ref Range Status   Specimen Description BLOOD LEFT ANTECUBITAL  Final   Special Requests   Final    BOTTLES DRAWN AEROBIC AND ANAEROBIC Blood Culture adequate volume   Culture   Final    NO GROWTH 3 DAYS Performed at [facility], [address], [place], [place] [address]    Report Status PENDING  Incomplete  Culture, blood (routine x 2)     Status: None (Preliminary result)   Collection Time: 01/28/17  2:07 PM  Result Value Ref Range Status   Specimen Description LEFT ANTECUBITAL  Final   Special Requests   Final    BOTTLES DRAWN AEROBIC AND ANAEROBIC Blood Culture adequate volume   Culture   Final    NO GROWTH 2 DAYS Performed at [facility], [place] [address] [place], [place] [address]    Report Status PENDING  Incomplete  Culture, blood (routine x 2)     Status: None (Preliminary result)   Collection Time: 01/28/17  2:07 PM  Result Value Ref Range Status   Specimen Description PORTA CATH  Final   Special Requests   Final    BOTTLES DRAWN AEROBIC AND ANAEROBIC Blood Culture adequate volume   Culture   Final    NO GROWTH 2 DAYS Performed at [facility], [address] [place], [place] [address]    Report Status PENDING  Incomplete  Culture, blood (Routine  X 2) w Reflex to ID Panel     Status: None (Preliminary result)    Collection Time: 01/29/17  9:54 AM  Result Value Ref Range Status   Specimen Description BLOOD RIGHT ANTECUBITAL  Final   Special Requests   Final    BOTTLES DRAWN AEROBIC AND ANAEROBIC Blood Culture adequate volume   Culture   Final    NO GROWTH < 24 HOURS Performed at [facility], [place] [address] [place], [place] [address]    Report Status PENDING  Incomplete  Culture, blood (Routine X 2) w Reflex to ID Panel     Status: None (Preliminary result)   Collection Time: 01/29/17 10:58 AM  Result Value Ref Range Status   Specimen Description BLOOD LEFT ARM  Final   Special Requests   Final    BOTTLES DRAWN AEROBIC AND ANAEROBIC Blood Culture adequate volume   Culture   Final    NO GROWTH < 24 HOURS Performed at [facility], [place] [address], [place], [place] [address]    Report Status PENDING  Incomplete     Medications:   . aspirin EC  325 mg Oral Q72H  . enoxaparin (LOVENOX) injection  40 mg Subcutaneous Q24H   Continuous Infusions: .  ceFAZolin (ANCEF) IV Stopped (01/30/17 0545)      LOS: 2 days   Charlynne Cousins  [facility] Pager [phone]  *Please refer to [email or web address], password [email or web address] to get updated schedule on who will round on this patient, as hospitalists switch teams weekly. If 7PM-7AM, please contact night-coverage at [email or web address], password [email or web address] for any overnight needs.  01/30/2017, 11:32 AM

## 2017-01-30 NOTE — H&P (View-Only) (Signed)
[facility] PROGRESS NOTE    Progress Note  Nicholas Suarez  PJA:[id number] DOB: [birth year] DOA: 01/28/2017 PCP: Janith Lima, MD     Brief Narrative:   Nicholas Suarez is an 66 y.o. male past medical history abdominal mesh placement December 2017 followed by multiple rector procedures rectal cancer  with marked lymphadenopathy on chemotherapy with a Port-A-Cath in place with the last therapy on 01/17/2017, called in by infectious disease doctor for 1 out of 2 blood cultures positive for staph. He was here in the ED the day prior to admission and diagnosed with cellulitis.  Assessment/Plan:   Cellulitis and possible MSSA Bactermia: Blood culture ID panel performed on 01/27/2017 was positive for staph. Surveillance culture repeated on 01/29/2017. Total empirically on IV vancomycin and Ancef. His remaining afebrile with no leukocytosis. He does have a Port-A-Cath on the right side. We'll schedule a transesophageal echocardiogram, discuss with cardiology scheduled for a 21 2018.  Adenocarcinoma (HCC/  Rectal cancer [facility]) Follow-up with Dr. Malachy Mood is an outpatient.  Normocytic anemia Hemoglobin follow-up with hematology as an outpatient.  Thrombocytopenia ([place]) Likely due to chemotherapy continue to monitor.  DVT prophylaxis: Lovenox3 Family Communication:none Disposition Plan/Barrier to D/C: Unable to determined. Code Status:     Code Status Orders        Start     Ordered   01/28/17 1639  Full code  Continuous     01/28/17 1639    Code Status History    Date Active Date Inactive Code Status Order ID Comments User Context   This patient has a current code status but no historical code status.    Advance Directive Documentation     Most Recent Value  Type of Advance Directive  Healthcare Power of Attorney, Living will  Pre-existing out of facility DNR order (yellow form or pink MOST form)  -  "MOST" Form in Place?  -        IV Access:    Peripheral  IV   Procedures and diagnostic studies:   Dg Chest 2 View  Result Date: 01/27/2017 CLINICAL DATA:  Has port on left side was put in July. Pt has rectal cancer. Having swelling in his legs. exsmoker quit in the 90's. Asthma. Bronchitis 15 years ago. Pneumonia 10 years ago. Currently has a fever. EXAM: CHEST  2 VIEW COMPARISON:  04/28/2014 FINDINGS: The cardiac silhouette is normal in size and configuration. No mediastinal or hilar masses. No evidence of adenopathy. Clear lungs.  No pleural effusion or pneumothorax. There is a right anterior chest wall power Port-A-Cath, new since prior study. Tip lies in the lower superior vena cava. Skeletal structures are unremarkable. IMPRESSION: No active cardiopulmonary disease. Electronically Signed   By: Lajean Manes M.D.   On: 01/27/2017 13:00     Medical Consultants:    None.  Anti-Infectives:   IV vancomycin and Ancef.  Subjective:    DEBORAH DONDERO   Objective:    Vitals:   01/28/17 1851 01/28/17 2012 01/28/17 2110 01/29/17 0451  BP: (!) 147/68  113/62 115/67  Pulse: 82  93 83  Resp: 16  18 18   Temp:   99.3 F (37.4 C) 98.9 F (37.2 C)  TempSrc:   Oral Oral  SpO2: 100%  99% 99%  Weight:  91.2 kg (201 lb)    Height:  6\' 3"  (1.905 m)      Intake/Output Summary (Last 24 hours) at 01/29/17 3419 Last data filed at 01/29/17 0600  Gross  per 24 hour  Intake              350 ml  Output             1000 ml  Net             -650 ml   Filed Weights   01/28/17 2012  Weight: 91.2 kg (201 lb)    Exam: General exam: In no acute distress. Respiratory system: Good air movement and clear to auscultation. Cardiovascular system: Regular rate and rhythm with positive S1-S2 no murmurs rubs gallops Gastrointestinal system: Positive bowel sounds soft nontender nondistended Central nervous system: Awake alert and oriented 3 Extremities: No lower lower edema.. Skin: Skin area has been demarcated Psychiatry: Judgment and insight appear  normal.   Data Reviewed:    Labs: Basic Metabolic Panel:  Recent Labs Lab 01/27/17 1250 01/28/17 1406  NA 137 137  K 4.1 4.5  CL 106 107  CO2 26 24  GLUCOSE 111* 94  BUN 14 20  CREATININE 1.11 1.02  CALCIUM 10.0 9.7   GFR Estimated Creatinine Clearance: 85.1 mL/min (by C-G formula based on SCr of 1.02 mg/dL). Liver Function Tests:  Recent Labs Lab 01/27/17 1250  AST 29  ALT 28  ALKPHOS 57  BILITOT 0.7  PROT 6.4*  ALBUMIN 3.7   No results for input(s): LIPASE, AMYLASE in the last 168 hours. No results for input(s): AMMONIA in the last 168 hours. Coagulation profile No results for input(s): INR, PROTIME in the last 168 hours.  CBC:  Recent Labs Lab 01/27/17 1250 01/28/17 1406  WBC 7.6 10.0  NEUTROABS 5.9 7.7  HGB 12.5* 12.5*  HCT 36.4* 37.1*  MCV 91.2 91.8  PLT 123* 125*   Cardiac Enzymes: No results for input(s): CKTOTAL, CKMB, CKMBINDEX, TROPONINI in the last 168 hours. BNP (last 3 results) No results for input(s): PROBNP in the last 8760 hours. CBG: No results for input(s): GLUCAP in the last 168 hours. D-Dimer: No results for input(s): DDIMER in the last 72 hours. Hgb A1c: No results for input(s): HGBA1C in the last 72 hours. Lipid Profile: No results for input(s): CHOL, HDL, LDLCALC, TRIG, CHOLHDL, LDLDIRECT in the last 72 hours. Thyroid function studies: No results for input(s): TSH, T4TOTAL, T3FREE, THYROIDAB in the last 72 hours.  Invalid input(s): FREET3 Anemia work up: No results for input(s): VITAMINB12, FOLATE, FERRITIN, TIBC, IRON, RETICCTPCT in the last 72 hours. Sepsis Labs:  Recent Labs Lab 01/27/17 1250 01/28/17 1406 01/28/17 1440  WBC 7.6 10.0  --   LATICACIDVEN  --   --  1.04   Microbiology Recent Results (from the past 240 hour(s))  Culture, blood (single)     Status: None (Preliminary result)   Collection Time: 01/27/17 12:50 PM  Result Value Ref Range Status   Specimen Description BLOOD PORTA CATH  Final    Special Requests   Final    BOTTLES DRAWN AEROBIC AND ANAEROBIC Blood Culture adequate volume   Culture  Setup Time   Final    GRAM POSITIVE COCCI IN CLUSTERS AEROBIC BOTTLE ONLY CRITICAL RESULT CALLED TO, READ BACK BY AND VERIFIED WITH: S GOUGE,RN AT 1123 01/28/17 BY L BENFIELD Performed at [facility], [place] [address] [place], [place] [address]    Culture [facility] POSITIVE COCCI  Final   Report Status PENDING  Incomplete  Blood Culture ID Panel (Reflexed)     Status: Abnormal   Collection Time: 01/27/17 12:50 PM  Result Value Ref Range  Status   Enterococcus species NOT DETECTED NOT DETECTED Final   Vancomycin resistance NOT DETECTED NOT DETECTED Final   Listeria monocytogenes NOT DETECTED NOT DETECTED Final   Staphylococcus species DETECTED (A) NOT DETECTED Final    Comment: CRITICAL RESULT CALLED TO, READ BACK BY AND VERIFIED WITH: S GOUGE,RN AT 1123 01/28/17 BY L BENFIELD    Staphylococcus aureus DETECTED (A) NOT DETECTED Final    Comment: CRITICAL RESULT CALLED TO, READ BACK BY AND VERIFIED WITH: S GOUGE,RN AT 1123 01/28/17 BY L BENFIELD    Methicillin resistance NOT DETECTED NOT DETECTED Final   Streptococcus species NOT DETECTED NOT DETECTED Final   Streptococcus agalactiae NOT DETECTED NOT DETECTED Final   Streptococcus pneumoniae NOT DETECTED NOT DETECTED Final   Streptococcus pyogenes NOT DETECTED NOT DETECTED Final   Acinetobacter baumannii NOT DETECTED NOT DETECTED Final   Enterobacteriaceae species NOT DETECTED NOT DETECTED Final   Enterobacter cloacae complex NOT DETECTED NOT DETECTED Final   Escherichia coli NOT DETECTED NOT DETECTED Final   Klebsiella oxytoca NOT DETECTED NOT DETECTED Final   Klebsiella pneumoniae NOT DETECTED NOT DETECTED Final   Proteus species NOT DETECTED NOT DETECTED Final   Serratia marcescens NOT DETECTED NOT DETECTED Final   Carbapenem resistance NOT DETECTED NOT DETECTED Final   Haemophilus influenzae NOT DETECTED NOT DETECTED Final    Neisseria meningitidis NOT DETECTED NOT DETECTED Final   Pseudomonas aeruginosa NOT DETECTED NOT DETECTED Final   Candida albicans NOT DETECTED NOT DETECTED Final   Candida glabrata NOT DETECTED NOT DETECTED Final   Candida krusei NOT DETECTED NOT DETECTED Final   Candida parapsilosis NOT DETECTED NOT DETECTED Final   Candida tropicalis NOT DETECTED NOT DETECTED Final    Comment: Performed at [facility], [address] [place], [place] [address]  Culture, blood (single)     Status: None (Preliminary result)   Collection Time: 01/27/17 12:51 PM  Result Value Ref Range Status   Specimen Description BLOOD LEFT ANTECUBITAL  Final   Special Requests   Final    BOTTLES DRAWN AEROBIC AND ANAEROBIC Blood Culture adequate volume   Culture   Final    NO GROWTH < 24 HOURS Performed at [facility], [place] [address] [place], [place] [address]    Report Status PENDING  Incomplete     Medications:   . aspirin EC  325 mg Oral Q72H  . enoxaparin (LOVENOX) injection  40 mg Subcutaneous Q24H   Continuous Infusions: .  ceFAZolin (ANCEF) IV 1 g (01/29/17 0530)      LOS: 1 day   Charlynne Cousins  [facility] Pager [phone]  *Please refer to [email or web address], password [email or web address] to get updated schedule on who will round on this patient, as hospitalists switch teams weekly. If 7PM-7AM, please contact night-coverage at [email or web address], password [email or web address] for any overnight needs.  01/29/2017, 8:19 AM

## 2017-01-30 NOTE — Interval H&P Note (Signed)
History and Physical Interval Note:  01/30/2017 10:34 AM  Nicholas Suarez  has presented today for surgery, with the diagnosis of bacteremia  The various methods of treatment have been discussed with the patient and family. After consideration of risks, benefits and other options for treatment, the patient has consented to  Procedure(s): TRANSESOPHAGEAL ECHOCARDIOGRAM (TEE) (N/A) as a surgical intervention .  The patient's history has been reviewed, patient examined, no change in status, stable for surgery.  I have reviewed the patient's chart and labs.  Questions were answered to the patient's satisfaction.     Mertie Moores

## 2017-01-30 NOTE — H&P (View-Only) (Signed)
    [facility] for Infectious Disease    Date of Admission:  01/28/2017   Total days of antibiotics 3        Day 2 cefazolin          ID: Nicholas Suarez is a 67 y.o. male with a history of metastatic rectal cancer s/p FOLFOX chemotherapy,completed cycle 4 on 01/17/2017. On 01/27/2017 he developed right leg cellulitis with fever, initially discharged on amox/clav but then found to have MSSA bacteremia ,including bcx from Port-A-Cath 01/27/2017 +MSSA Principal Problem:   Cellulitis Active Problems:   Adenocarcinoma ([place])   Rectal cancer ([place])   Normocytic anemia   Thrombocytopenia (HCC)   MSSA bacteremia   Bacteremia    Subjective: Afebrile  TEE scheduled for 8/21 Medications:  . aspirin EC  325 mg Oral Q72H  . enoxaparin (LOVENOX) injection  40 mg Subcutaneous Q24H    Objective: Vital signs in last 24 hours: Temp:  [98.9 F (37.2 C)-99.3 F (37.4 C)] 99.3 F (37.4 C) (08/20 1415) Pulse Rate:  [82-93] 86 (08/20 1415) Resp:  [16-18] 18 (08/20 1415) BP: (108-147)/(62-68) 113/64 (08/20 1415) SpO2:  [98 %-100 %] 98 % (08/20 1415) Weight:  [201 lb (91.2 kg)] 201 lb (91.2 kg) (08/19 2012)  Physical Exam  Constitutional: He is oriented to person, place, and time. He appears well-developed and well-nourished. No distress.  HENT:  Mouth/Throat: Oropharynx is clear and moist. No oropharyngeal exudate.  Cardiovascular: Normal rate, regular rhythm and normal heart sounds. Exam reveals no gallop and no friction rub.  No murmur heard.  Pulmonary/Chest: Effort normal and breath sounds normal. No respiratory distress. He has no wheezes.  Chest wall: right porta cath is nontender Abdominal: Soft. Bowel sounds are normal. He exhibits no distension. There is no tenderness. No tenderness along umbilicus (hx of mesh) Ext: right thigh has scattered petechaie and still warmth/redness to lateral superior tibial region Skin: Skin is warm and dry. No rash noted. No erythema.  Psychiatric:  He has a normal mood and affect. His behavior is normal.     Lab Results  Recent Labs  01/28/17 1406 01/29/17 0835  WBC 10.0 7.2  HGB 12.5* 11.0*  HCT 37.1* 32.3*  NA 137 138  K 4.5 4.1  CL 107 108  CO2 24 25  BUN 20 12  CREATININE 1.02 1.06   Liver Panel  Recent Labs  01/27/17 1250  PROT 6.4*  ALBUMIN 3.7  AST 29  ALT 28  ALKPHOS 57  BILITOT 0.7   Sedimentation Rate No results for input(s): ESRSEDRATE in the last 72 hours. C-Reactive Protein No results for input(s): CRP in the last 72 hours.  Microbiology: 8/20 blood cx pending 8/19 blood cx NGTD 8/18 1 of 4 blood cx MSSA  Studies/Results: No results found.   Assessment/Plan: 66yo M immunocompromised host getting chemo via portacath admitted for cellulitis and found to have MSSA bacteremia - recommend removal of portacath as concern that bacteria has already created biofilm about the apparatus - await TEE to help decide on short course vs. Long course of IV abtx - repeat blood cx today and the day prior are pending  Right leg erythema/cellulitis = concern for nidus of infection? Septic thrombus? U/S pending  Baxter Flattery [facility] Infect[facility] Diseases Cell: [phone] Pager: [phone]  01/29/2017, 4:01 PM

## 2017-01-30 NOTE — Progress Notes (Signed)
  Echocardiogram Echocardiogram Transesophageal has been performed.  Nicholas Suarez 01/30/2017, 11:49 AM

## 2017-01-31 ENCOUNTER — Inpatient Hospital Stay (HOSPITAL_COMMUNITY): Payer: Medicare Other

## 2017-01-31 ENCOUNTER — Other Ambulatory Visit: Payer: Self-pay

## 2017-01-31 ENCOUNTER — Telehealth: Payer: Self-pay | Admitting: Emergency Medicine

## 2017-01-31 ENCOUNTER — Ambulatory Visit: Payer: Federal, State, Local not specified - PPO

## 2017-01-31 ENCOUNTER — Encounter (HOSPITAL_COMMUNITY): Payer: Self-pay | Admitting: Interventional Radiology

## 2017-01-31 ENCOUNTER — Ambulatory Visit: Payer: Federal, State, Local not specified - PPO | Admitting: Oncology

## 2017-01-31 ENCOUNTER — Other Ambulatory Visit: Payer: Federal, State, Local not specified - PPO

## 2017-01-31 DIAGNOSIS — C801 Malignant (primary) neoplasm, unspecified: Secondary | ICD-10-CM

## 2017-01-31 HISTORY — PX: IR REMOVAL TUN ACCESS W/ PORT W/O FL MOD SED: IMG2290

## 2017-01-31 LAB — PROTIME-INR
INR: 0.96
PROTHROMBIN TIME: 12.8 s (ref 11.4–15.2)

## 2017-01-31 MED ORDER — MIDAZOLAM HCL 2 MG/2ML IJ SOLN
INTRAMUSCULAR | Status: AC
  Filled 2017-01-31: qty 2

## 2017-01-31 MED ORDER — LIDOCAINE-EPINEPHRINE (PF) 2 %-1:200000 IJ SOLN
INTRAMUSCULAR | Status: AC | PRN
  Administered 2017-01-31: 10 mL

## 2017-01-31 MED ORDER — FENTANYL CITRATE (PF) 100 MCG/2ML IJ SOLN
INTRAMUSCULAR | Status: AC
  Filled 2017-01-31: qty 2

## 2017-01-31 MED ORDER — LIDOCAINE-EPINEPHRINE (PF) 2 %-1:200000 IJ SOLN
INTRAMUSCULAR | Status: AC
  Filled 2017-01-31: qty 20

## 2017-01-31 MED ORDER — FENTANYL CITRATE (PF) 100 MCG/2ML IJ SOLN
INTRAMUSCULAR | Status: AC | PRN
  Administered 2017-01-31: 50 ug via INTRAVENOUS

## 2017-01-31 MED ORDER — MIDAZOLAM HCL 2 MG/2ML IJ SOLN
INTRAMUSCULAR | Status: AC | PRN
  Administered 2017-01-31: 1 mg via INTRAVENOUS

## 2017-01-31 NOTE — Procedures (Signed)
  Procedure:   R Port removal    Preprocedure diagnosis:  bacteremia Postprocedure diagnosis:  same EBL:     minimal Complications:   none immediate  See full dictation in [facility].  Dillard Cannon MD Main # [phone] Pager  [phone]

## 2017-01-31 NOTE — Progress Notes (Signed)
[facility] Infusion Coordinator following pt plan of treatment with ID team to support IV ABX at DC as ordered.   If patient discharges after hours, please call [phone].   Larry Sierras 01/31/2017, 8:12 AM

## 2017-01-31 NOTE — Progress Notes (Signed)
PROGRESS NOTE  Nicholas Suarez LOV:[id number] DOB: 1951-06-10 DOA: 01/28/2017 PCP: Janith Lima, MD   LOS: 3 days   Brief Narrative / Interim history: Nicholas Suarez is an 66 y.o. male past medical history abdominal mesh placement December 2017 followed by multiple rector procedures rectal cancer  with marked lymphadenopathy on chemotherapy with a Port-A-Cath in place with the last therapy on 01/17/2017, called in by infectious disease doctor for 1 out of 2 blood cultures positive for staph. He was here in the ED the day prior to admission and diagnosed with cellulitis. MSSA bacteremia ,includingbcx fromPort-A-Cath 01/27/2017 +MSSA  Assessment & Plan: Principal Problem:   Cellulitis Active Problems:   Adenocarcinoma (HCC)   Rectal cancer (HCC)   Normocytic anemia   Thrombocytopenia (HCC)   MSSA bacteremia   Bacteremia   Cellulitis/MSSA bacteremia -Blood cultures on 8/18 were positive for staph -Surveillance cultures on 8/20 without growth to date -Patient plan for Port-A-Cath removal today, will place a PICC line tomorrow and plan for 14 days of IV antibiotics per infectious disease recommendations -TEE performed on 01/30/2017 without evidence of vegetations  Adenocarcinoma/rectal cancer -Follows with Dr. Benay Spice as an outpatient, supposed to get chemotherapy again 8/22.  This will likely need to be delayed  Normocytic anemia/pancytopenia -Likely in the setting of chemotherapy, outpatient monitoring   DVT prophylaxis: Lovenox Code Status: Full code Family Communication: Sister present at bedside Disposition Plan: Home likely tomorrow  Consultants:   Infectious disease  Interventional radiology  Procedures:   TEE Study Conclusions  - Left ventricle: The cavity size was normal. Wall thickness was   normal. Systolic function was normal. - Aortic valve: No evidence of vegetation. - Mitral valve: No evidence of vegetation. - Left atrium: No evidence of thrombus  in the atrial cavity or   appendage. No evidence of thrombus in the atrial cavity or   appendage. - Tricuspid valve: No evidence of vegetation. - Pulmonic valve: No evidence of vegetation.  Antimicrobials:  Ancef    Subjective: - no chest pain, shortness of breath, no abdominal pain, nausea or vomiting.   Objective: Vitals:   01/30/17 1127 01/30/17 1216 01/30/17 2103 01/31/17 0658  BP: 113/80 122/72 110/63 134/72  Pulse: 79 80 80 78  Resp: 17 18 18 18   Temp:  97.8 F (36.6 C) 98.7 F (37.1 C) 97.7 F (36.5 C)  TempSrc:  Oral Oral Oral  SpO2: 97% 100% 96% 98%  Weight:      Height:        Intake/Output Summary (Last 24 hours) at 01/31/17 1106 Last data filed at 01/31/17 1000  Gross per 24 hour  Intake             1620 ml  Output             2600 ml  Net             -980 ml   Filed Weights   01/28/17 2012  Weight: 91.2 kg (201 lb)    Examination:  Vitals:   01/30/17 1127 01/30/17 1216 01/30/17 2103 01/31/17 0658  BP: 113/80 122/72 110/63 134/72  Pulse: 79 80 80 78  Resp: 17 18 18 18   Temp:  97.8 F (36.6 C) 98.7 F (37.1 C) 97.7 F (36.5 C)  TempSrc:  Oral Oral Oral  SpO2: 97% 100% 96% 98%  Weight:      Height:        Constitutional: NAD ENMT: Mucous membranes are moist.  Neck: normal,  supple Respiratory: clear to auscultation bilaterally, no wheezing, no crackles. Normal respiratory effort. No accessory muscle use.  Cardiovascular: Regular rate and rhythm, no murmurs / rubs / gallops. No LE edema. 2+ pedal pulses.  Abdomen: no tenderness. Bowel sounds positive.  Skin: no rashes, lesions, ulcers. No induration Neurologic: non focal   Data Reviewed: I have independently reviewed following labs and imaging studies   CBC:  Recent Labs Lab 01/27/17 1250 01/28/17 1406 01/29/17 0835  WBC 7.6 10.0 7.2  NEUTROABS 5.9 7.7  --   HGB 12.5* 12.5* 11.0*  HCT 36.4* 37.1* 32.3*  MCV 91.2 91.8 90.5  PLT 123* 125* 130*   Basic Metabolic Panel:  Recent  Labs Lab 01/27/17 1250 01/28/17 1406 01/29/17 0835  NA 137 137 138  K 4.1 4.5 4.1  CL 106 107 108  CO2 26 24 25   GLUCOSE 111* 94 151*  BUN 14 20 12   CREATININE 1.11 1.02 1.06  CALCIUM 10.0 9.7 9.4   GFR: Estimated Creatinine Clearance: 81.9 mL/min (by C-G formula based on SCr of 1.06 mg/dL). Liver Function Tests:  Recent Labs Lab 01/27/17 1250  AST 29  ALT 28  ALKPHOS 57  BILITOT 0.7  PROT 6.4*  ALBUMIN 3.7   No results for input(s): LIPASE, AMYLASE in the last 168 hours. No results for input(s): AMMONIA in the last 168 hours. Coagulation Profile:  Recent Labs Lab 01/31/17 0008  INR 0.96   Cardiac Enzymes: No results for input(s): CKTOTAL, CKMB, CKMBINDEX, TROPONINI in the last 168 hours. BNP (last 3 results) No results for input(s): PROBNP in the last 8760 hours. HbA1C: No results for input(s): HGBA1C in the last 72 hours. CBG: No results for input(s): GLUCAP in the last 168 hours. Lipid Profile: No results for input(s): CHOL, HDL, LDLCALC, TRIG, CHOLHDL, LDLDIRECT in the last 72 hours. Thyroid Function Tests: No results for input(s): TSH, T4TOTAL, FREET4, T3FREE, THYROIDAB in the last 72 hours. Anemia Panel: No results for input(s): VITAMINB12, FOLATE, FERRITIN, TIBC, IRON, RETICCTPCT in the last 72 hours. Urine analysis:    Component Value Date/Time   COLORURINE YELLOW 11/01/2016 0949   APPEARANCEUR CLEAR 11/01/2016 0949   LABSPEC <=1.005 (A) 11/01/2016 0949   PHURINE 6.0 11/01/2016 0949   GLUCOSEU NEGATIVE 11/01/2016 0949   HGBUR TRACE-INTACT (A) 11/01/2016 0949   BILIRUBINUR NEGATIVE 11/01/2016 0949   KETONESUR NEGATIVE 11/01/2016 0949   PROTEINUR Negative 12/20/2016 1036   UROBILINOGEN 0.2 11/01/2016 0949   NITRITE NEGATIVE 11/01/2016 0949   LEUKOCYTESUR NEGATIVE 11/01/2016 0949   Sepsis Labs: Invalid input(s): PROCALCITONIN, LACTICIDVEN  Recent Results (from the past 240 hour(s))  Culture, blood (single)     Status: Abnormal   Collection  Time: 01/27/17 12:50 PM  Result Value Ref Range Status   Specimen Description BLOOD PORTA CATH  Final   Special Requests   Final    BOTTLES DRAWN AEROBIC AND ANAEROBIC Blood Culture adequate volume   Culture  Setup Time   Final    GRAM POSITIVE COCCI IN CLUSTERS AEROBIC BOTTLE ONLY CRITICAL RESULT CALLED TO, READ BACK BY AND VERIFIED WITH: S GOUGE,RN AT 1123 01/28/17 BY L BENFIELD Performed at [facility], [place] [address], [place], [place] [address]    Culture STAPHYLOCOCCUS AUREUS (A)  Final   Report Status 01/30/2017 FINAL  Final   Organism ID, Bacteria STAPHYLOCOCCUS AUREUS  Final      Susceptibility   Staphylococcus aureus - MIC*    CIPROFLOXACIN <=0.5 SENSITIVE Sensitive     ERYTHROMYCIN <=  0.25 SENSITIVE Sensitive     GENTAMICIN <=0.5 SENSITIVE Sensitive     OXACILLIN <=0.25 SENSITIVE Sensitive     TETRACYCLINE <=1 SENSITIVE Sensitive     VANCOMYCIN <=0.5 SENSITIVE Sensitive     TRIMETH/SULFA <=10 SENSITIVE Sensitive     CLINDAMYCIN <=0.25 SENSITIVE Sensitive     RIFAMPIN <=0.5 SENSITIVE Sensitive     Inducible Clindamycin NEGATIVE Sensitive     * STAPHYLOCOCCUS AUREUS  Blood Culture ID Panel (Reflexed)     Status: Abnormal   Collection Time: 01/27/17 12:50 PM  Result Value Ref Range Status   Enterococcus species NOT DETECTED NOT DETECTED Final   Vancomycin resistance NOT DETECTED NOT DETECTED Final   Listeria monocytogenes NOT DETECTED NOT DETECTED Final   Staphylococcus species DETECTED (A) NOT DETECTED Final    Comment: CRITICAL RESULT CALLED TO, READ BACK BY AND VERIFIED WITH: S GOUGE,RN AT 1123 01/28/17 BY L BENFIELD    Staphylococcus aureus DETECTED (A) NOT DETECTED Final    Comment: CRITICAL RESULT CALLED TO, READ BACK BY AND VERIFIED WITH: S GOUGE,RN AT 1123 01/28/17 BY L BENFIELD    Methicillin resistance NOT DETECTED NOT DETECTED Final   Streptococcus species NOT DETECTED NOT DETECTED Final   Streptococcus agalactiae NOT DETECTED NOT DETECTED Final    Streptococcus pneumoniae NOT DETECTED NOT DETECTED Final   Streptococcus pyogenes NOT DETECTED NOT DETECTED Final   Acinetobacter baumannii NOT DETECTED NOT DETECTED Final   Enterobacteriaceae species NOT DETECTED NOT DETECTED Final   Enterobacter cloacae complex NOT DETECTED NOT DETECTED Final   Escherichia coli NOT DETECTED NOT DETECTED Final   Klebsiella oxytoca NOT DETECTED NOT DETECTED Final   Klebsiella pneumoniae NOT DETECTED NOT DETECTED Final   Proteus species NOT DETECTED NOT DETECTED Final   Serratia marcescens NOT DETECTED NOT DETECTED Final   Carbapenem resistance NOT DETECTED NOT DETECTED Final   Haemophilus influenzae NOT DETECTED NOT DETECTED Final   Neisseria meningitidis NOT DETECTED NOT DETECTED Final   Pseudomonas aeruginosa NOT DETECTED NOT DETECTED Final   Candida albicans NOT DETECTED NOT DETECTED Final   Candida glabrata NOT DETECTED NOT DETECTED Final   Candida krusei NOT DETECTED NOT DETECTED Final   Candida parapsilosis NOT DETECTED NOT DETECTED Final   Candida tropicalis NOT DETECTED NOT DETECTED Final    Comment: Performed at [facility], [address] [place], [place] [address]  Culture, blood (single)     Status: None (Preliminary result)   Collection Time: 01/27/17 12:51 PM  Result Value Ref Range Status   Specimen Description BLOOD LEFT ANTECUBITAL  Final   Special Requests   Final    BOTTLES DRAWN AEROBIC AND ANAEROBIC Blood Culture adequate volume   Culture   Final    NO GROWTH 4 DAYS Performed at [facility], [place] [address], [place], [place] [address]    Report Status PENDING  Incomplete  Culture, blood (routine x 2)     Status: None (Preliminary result)   Collection Time: 01/28/17  2:07 PM  Result Value Ref Range Status   Specimen Description LEFT ANTECUBITAL  Final   Special Requests   Final    BOTTLES DRAWN AEROBIC AND ANAEROBIC Blood Culture adequate volume   Culture   Final    NO GROWTH 3 DAYS Performed at [facility], [place] [address] [place], [place] [address]    Report Status PENDING  Incomplete  Culture, blood (routine x 2)     Status: None (Preliminary result)  Collection Time: 01/28/17  2:07 PM  Result Value Ref Range Status   Specimen Description PORTA CATH  Final   Special Requests   Final    BOTTLES DRAWN AEROBIC AND ANAEROBIC Blood Culture adequate volume   Culture   Final    NO GROWTH 3 DAYS Performed at [facility], [address] [place], [place] [address]    Report Status PENDING  Incomplete  Culture, blood (Routine X 2) w Reflex to ID Panel     Status: None (Preliminary result)   Collection Time: 01/29/17  9:54 AM  Result Value Ref Range Status   Specimen Description BLOOD RIGHT ANTECUBITAL  Final   Special Requests   Final    BOTTLES DRAWN AEROBIC AND ANAEROBIC Blood Culture adequate volume   Culture   Final    NO GROWTH 2 DAYS Performed at [facility], [place] [address] [place], [place] [address]    Report Status PENDING  Incomplete  Culture, blood (Routine X 2) w Reflex to ID Panel     Status: None (Preliminary result)   Collection Time: 01/29/17 10:58 AM  Result Value Ref Range Status   Specimen Description BLOOD LEFT ARM  Final   Special Requests   Final    BOTTLES DRAWN AEROBIC AND ANAEROBIC Blood Culture adequate volume   Culture   Final    NO GROWTH 2 DAYS Performed at [facility], [place] [address] [place], [place] [address]    Report Status PENDING  Incomplete      Radiology Studies: No results found.   Scheduled Meds: . lidocaine-EPINEPHrine      . aspirin EC  325 mg Oral Q72H  . enoxaparin (LOVENOX) injection  40 mg Subcutaneous Q24H   Continuous Infusions: .  ceFAZolin (ANCEF) IV Stopped (01/31/17 0557)    Marzetta Board, MD, PhD [facility] Pager [phone]  If 7PM-7AM, please contact night-coverage [email or web address] Password [email or web address] 01/31/2017, 11:06 AM

## 2017-01-31 NOTE — Progress Notes (Signed)
PHARMACY CONSULT NOTE FOR:  OUTPATIENT  PARENTERAL ANTIBIOTIC THERAPY (OPAT)  Indication: MSSA bacteremia Regimen: Ancef 2g IV q8h End date: 02/14/17  IV antibiotic discharge orders are pended. To discharging provider:  please sign these orders via discharge navigator,  Select New Orders & click on the button choice - Manage This Unsigned Work.     Thank you for allowing pharmacy to be a part of this patient's care.  Peggyann Juba, PharmD, BCPS Pager: [phone] 01/31/2017, 5:09 PM

## 2017-01-31 NOTE — Discharge Instructions (Signed)
Moderate Conscious Sedation, Adult, Care After °These instructions provide you with information about caring for yourself after your procedure. Your health care provider may also give you more specific instructions. Your treatment has been planned according to current medical practices, but problems sometimes occur. Call your health care provider if you have any problems or questions after your procedure. °What can I expect after the procedure? °After your procedure, it is common: °· To feel sleepy for several hours. °· To feel clumsy and have poor balance for several hours. °· To have poor judgment for several hours. °· To vomit if you eat too soon. ° °Follow these instructions at home: °For at least 24 hours after the procedure: ° °· Do not: °? Participate in activities where you could fall or become injured. °? Drive. °? Use heavy machinery. °? Drink alcohol. °? Take sleeping pills or medicines that cause drowsiness. °? Make important decisions or sign legal documents. °? Take care of children on your own. °· Rest. °Eating and drinking °· Follow the diet recommended by your health care provider. °· If you vomit: °? Drink water, juice, or soup when you can drink without vomiting. °? Make sure you have little or no nausea before eating solid foods. °General instructions °· Have a responsible adult stay with you until you are awake and alert. °· Take over-the-counter and prescription medicines only as told by your health care provider. °· If you smoke, do not smoke without supervision. °· Keep all follow-up visits as told by your health care provider. This is important. °Contact a health care provider if: °· You keep feeling nauseous or you keep vomiting. °· You feel light-headed. °· You develop a rash. °· You have a fever. °Get help right away if: °· You have trouble breathing. °This information is not intended to replace advice given to you by your health care provider. Make sure you discuss any questions you have  with your health care provider. °Document Released: 03/19/2013 Document Revised: 11/01/2015 Document Reviewed: 09/18/2015 °Elsevier Interactive Patient Education © 2018 [employer] ° ° ° ° °Implanted Port Removal, Care After °Refer to this sheet in the next few weeks. These instructions provide you with information about caring for yourself after your procedure. Your health care provider may also give you more specific instructions. Your treatment has been planned according to current medical practices, but problems sometimes occur. Call your health care provider if you have any problems or questions after your procedure. °What can I expect after the procedure? °After the procedure, it is common to have: °· Soreness or pain near your incision. °· Some swelling or bruising near your incision. ° °Follow these instructions at home: °Medicines °· Take over-the-counter and prescription medicines only as told by your health care provider. °· If you were prescribed an antibiotic medicine, take it as told by your health care provider. Do not stop taking the antibiotic even if you start to feel better. °Bathing °· Do not take baths, swim, or use a hot tub until your health care provider approves. Ask your health care provider if you can take showers. You may only be allowed to take sponge baths for bathing. °Incision care °· Follow instructions from your health care provider about how to take care of your incision. Make sure you: °? Wash your hands with soap and water before you change your bandage (dressing). If soap and water are not available, use hand sanitizer. °? Change your dressing as told by your health care provider. °?   Keep your dressing dry. °? Leave stitches (sutures), skin glue, or adhesive strips in place. These skin closures may need to stay in place for 2 weeks or longer. If adhesive strip edges start to loosen and curl up, you may trim the loose edges. Do not remove adhesive strips completely unless your  health care provider tells you to do that. °· Check your incision area every day for signs of infection. Check for: °? More redness, swelling, or pain. °? More fluid or blood. °? Warmth. °? Pus or a bad smell. °Driving °· If you received a sedative, do not drive for 24 hours after the procedure. °· If you did not receive a sedative, ask your health care provider when it is safe to drive. °Activity °· Return to your normal activities as told by your health care provider. Ask your health care provider what activities are safe for you. °· Until your health care provider says it is safe: °? Do not lift anything that is heavier than 10 lb (4.5 kg). °? Do not do activities that involve lifting your arms over your head. °General instructions °· Do not use any tobacco products, such as cigarettes, chewing tobacco, and e-cigarettes. Tobacco can delay healing. If you need help quitting, ask your health care provider. °· Keep all follow-up visits as told by your health care provider. This is important. °Contact a health care provider if: °· You have more redness, swelling, or pain around your incision. °· You have more fluid or blood coming from your incision. °· Your incision feels warm to the touch. °· You have pus or a bad smell coming from your incision. °· You have a fever. °· You have pain that is not relieved by your pain medicine. °Get help right away if: °· You have chest pain. °· You have difficulty breathing. °This information is not intended to replace advice given to you by your health care provider. Make sure you discuss any questions you have with your health care provider. °Document Released: 05/10/2015 Document Revised: 11/04/2015 Document Reviewed: 03/03/2015 °Elsevier Interactive Patient Education © 2018 Elsevier Inc. ° °

## 2017-01-31 NOTE — Telephone Encounter (Signed)
Post ED Visit - Positive Culture Follow-up  Culture report reviewed by antimicrobial stewardship pharmacist:  []  Elenor Quinones, Pharm.D. []  Heide Guile, Pharm.D., BCPS AQ-ID []  Parks Neptune, Pharm.D., BCPS []  Alycia Rossetti, Pharm.D., BCPS []  [place], Pharm.D., BCPS, AAHIVP []  Legrand Como, Pharm.D., BCPS, AAHIVP []  Salome Arnt, PharmD, BCPS []  Dimitri Ped, PharmD, BCPS []  Vincenza Hews, PharmD, BCPS Nida Boatman PharmD  Positive blood culture Patient current inpatient at [facility] and no further patient follow-up is required at this time.  Hazle Nordmann 01/31/2017, 2:07 PM

## 2017-01-31 NOTE — Progress Notes (Signed)
[facility] for Infectious Disease    Date of Admission:  01/28/2017   Total days of antibiotics 5        Day 4 cefazolin           ID: Nicholas Suarez is a 66 y.o. male with history of metastatic rectal cancer s/p FOLFOX chemotherapy,completed cycle 4 on 01/17/2017. On 01/27/2017 he developed right leg cellulitis with fever, initially discharged on amox/clav but then found to have MSSA bacteremia ,including bcx from Port-A-Cath 01/27/2017 +MSSA Principal Problem:   Cellulitis Active Problems:   Adenocarcinoma ([place])   Rectal cancer ([place])   Normocytic anemia   Thrombocytopenia (HCC)   MSSA bacteremia   Bacteremia    Subjective: He underwent right chest wall portacath removal without difficulty. He denies pain at site. No chills nightsweats, nor fever, nor diarrhea  Meds: reviewed Objective: Vital signs in last 24 hours: Temp:  [97.7 F (36.5 C)-98.7 F (37.1 C)] 97.7 F (36.5 C) (08/22 0658) Pulse Rate:  [75-86] 86 (08/22 1129) Resp:  [12-18] 12 (08/22 1129) BP: (103-134)/(63-72) 110/64 (08/22 1129) SpO2:  [96 %-100 %] 100 % (08/22 1129) Physical Exam  Constitutional: He is oriented to person, place, and time. He appears well-developed and well-nourished. No distress.  HENT:  Mouth/Throat: Oropharynx is clear and moist. No oropharyngeal exudate.  Cardiovascular: Nl s1,s2, no g/m/r Pulmonary/Chest: Effort normal and breath sounds normal. No respiratory distress. He has no wheezes.  Chest wall = bandaged at previous port site removal Neurological: He is alert and oriented to person, place, and time.  Skin: Skin is warm and dry. No rash noted. No erythema.  Psychiatric: He has a normal mood and affect. His behavior is normal.    Lab Results  Recent Labs  01/29/17 0835  WBC 7.2  HGB 11.0*  HCT 32.3*  NA 138  K 4.1  CL 108  CO2 25  BUN 12  CREATININE 1.06    Microbiology: 8/20 blood cx pending 8/19 blood cx NGTD 8/18 1 of 4 blood cx  MSSA   Assessment/Plan: MSSA bacteremia = likely isolated to portacath. can place picc line later today/tomorrow am. Plan to treat for simple bacteremia with a plan of 2 wk using 8/22 as day 1. End date will be sept 5th.  Keep picc line in place for dr sherrill to decide if need to convert back to portacath  Home health orders listed below, will sign off.      [facility]icrobial Management Team Staphylococcus aureus bacteremia   Staphylococcus aureus bacteremia (SAB) is associated with a high rate of complications and mortality.  Specific aspects of clinical management are critical to optimizing the outcome of patients with SAB.  Therefore, the [facility] Antimicrobial Management Team [facility]) has initiated an intervention aimed at improving the management of SAB at [facility].  To do so, Infectious Diseases physicians are providing an evidence-based consult for the management of all patients with SAB.     Yes No Comments  Perform follow-up blood cultures (even if the patient is afebrile) to ensure clearance of bacteremia [x]  []  9/19 NGTD  Remove vascular catheter and obtain follow-up blood cultures after the removal of the catheter [x]  []  On 8/22  Perform echocardiography to evaluate for endocarditis (transthoracic ECHO is 40-50% sensitive, TEE is > 90% sensitive) [x]  []  TEE on 8/21 is NEGATIVE                 Change antibiotic therapy to ___cefazolin 2gm  IV q 8hr____ [x]  []    Estimated duration of IV antibiotic therapy:  2 wk [x]  []  Consult case management for probably prolonged outpatient IV antibiotic therapy  ------------------------- Diagnosis: bacteremia  Culture Result: mssa  No Known Allergies  OPAT Orders Discharge antibiotics: Per pharmacy protocol cefazolin 2gm IV Q8hr  End Date: Sept 5th  [place] Per Protocol:  Labs weekly while on IV antibiotics: _x_ CBC with differential x BMP  _x_ Please leave PIC in place until doctor has seen patient or been  notified  Fax weekly labs to [phone]  Clinic Follow Up Appt: Dr Nardos Putnam in 2-3 wk  @    [place], [facility] for Infectious Diseases Cell: [phone] Pager: [phone]  01/31/2017, 2:08 PM

## 2017-02-01 LAB — CULTURE, BLOOD (SINGLE)
CULTURE: NO GROWTH
SPECIAL REQUESTS: ADEQUATE

## 2017-02-01 MED ORDER — SODIUM CHLORIDE 0.9% FLUSH: 10.0000 mL | INTRAVENOUS | Status: DC | PRN

## 2017-02-01 MED ORDER — CEFAZOLIN IV (FOR PTA / DISCHARGE USE ONLY): 2.0000 g | Freq: Three times a day (TID) | INTRAVENOUS | 0 refills | Status: DC

## 2017-02-01 MED ORDER — HEPARIN SOD (PORK) LOCK FLUSH 100 UNIT/ML IV SOLN
250.0000 [IU] | INTRAVENOUS | Status: AC | PRN
  Administered 2017-02-01: 15:00:00

## 2017-02-01 NOTE — Progress Notes (Signed)
Peripherally Inserted Central Catheter/Midline Placement  The IV Nurse has discussed with the patient and/or persons authorized to consent for the patient, the purpose of this procedure and the potential benefits and risks involved with this procedure.  The benefits include less needle sticks, lab draws from the catheter, and the patient may be discharged home with the catheter. Risks include, but not limited to, infection, bleeding, blood clot (thrombus formation), and puncture of an artery; nerve damage and irregular heartbeat and possibility to perform a PICC exchange if needed/ordered by physician.  Alternatives to this procedure were also discussed.  Bard Power PICC patient education guide, fact sheet on infection prevention and patient information card has been provided to patient /or left at bedside.    PICC/Midline Placement Documentation        Nicholas Suarez 02/01/2017, 11:12 AM

## 2017-02-01 NOTE — Discharge Summary (Signed)
Physician Discharge Summary  Nicholas Suarez RCV:[id number] DOB: December 31, 1950 DOA: 01/28/2017  PCP: Janith Lima, MD  Admit date: 01/28/2017 Discharge date: 02/01/2017  Admitted From: home Disposition:  home  Recommendations for Outpatient Follow-up:  1. Follow up with PCP in 1-2 weeks 2. IV Ancef for 14 days on discharge via PICC Line for MSSA bacteremia  Home Health: RN Equipment/Devices: non  Discharge Condition: stable CODE STATUS: Full code Diet recommendation: regular  HPI: Per Dr. Benjamine Mola, Nicholas Suarez is a 66 y.o. male with medical history significant of rectal cancer undergoing chemotherapy admitted with right lower extremity cellulitis. Patient came to the emergency room on 01/27/2017 with complaints of fever and right leg swelling. He had an ultrasound of the right lower extremity which was negative for any DVT. He was given IV Rocephin, blood cultures were obtained and was discharged home on Augmentin. He was asked to follow-up with Dr. Ammie Dalton. But today his 1 out of 2 blood cultures came back positive for MSSA bacteremia. So Dr. Johnnye Sima called him to come back to the emergency room to get admitted to the hospital. 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 he is not sure if he had some kind of bites insect bites. He denied any nausea vomiting or diarrhea. He does not have any urinary complaints. He gets chemotherapy once in 2 weeks. ED Course: He received vancomycin and Ancef. Blood cultures were drawn. Lactic acid level was 1.04. White count was 10 hemoglobin 12.5 and platelet count was 125. His BUN was 20 creatinine 1.02 sodium 131 potassium 4.5.  Hospital Course: Discharge Diagnoses:  Principal Problem:   Cellulitis Active Problems:   Adenocarcinoma ([place])   Rectal cancer ([place])   Normocytic anemia   Thrombocytopenia ([facility])   MSSA bacteremia   Bacteremia   Cellulitis/MSSA bacteremia -Blood cultures on 8/18 were positive for MSSA. ID was  consulted and has followed patient while hospitalized. Surveillance cultures on 8/20 without growth to date. IR was consulted and his Port-A-Cath was removed on 8/22. Following removal, a day later patient had a PICC Line placed. ID recommended 14 days of Ancef after his cath removal with end date 9/5. In addition, he underwent a TEE performed on 01/30/2017 without evidence of vegetations. Adenocarcinoma/rectal cancer -Follows with Dr. Benay Spice as an outpatient, supposed to get chemotherapy again 8/22.  This will likely need to be delayed Normocytic anemia/pancytopenia -Likely in the setting of chemotherapy, outpatient monitoring. Overall stable   Discharge Instructions  Discharge Instructions    Home infusion instructions [facility] May follow [place] Dosing Protocol; May administer Cathflo as needed to maintain patency of vascular access device.; Flushing of vascular access device: per [facility] Protocol: 0.9% NaCl pre/post medica...    Complete by:  As directed    Instructions:  May follow [place] Dosing Protocol   Instructions:  May administer Cathflo as needed to maintain patency of vascular access device.   Instructions:  Flushing of vascular access device: per [facility] Protocol: 0.9% NaCl pre/post medication administration and prn patency; Heparin 100 u/ml, 84m for implanted ports and Heparin 10u/ml, 575mfor all other central venous catheters.   Instructions:  May follow [facility] Anaphylaxis Protocol for First Dose Administration in the home: 0.9% NaCl at 25-50 ml/hr to maintain IV access for protocol meds. Epinephrine 0.3 ml IV/IM PRN and Benadryl 25-50 IV/IM PRN s/s of anaphylaxis.   Instructions:  [facility] Coord[facility] (RN) to assist per patient IV care needs  in the home PRN.     Allergies as of 02/01/2017   No Known Allergies     Medication List    STOP taking these medications   amoxicillin-clavulanate 875-125 MG tablet Commonly known as:  AUGMENTIN     TAKE  these medications   aspirin 325 MG tablet Take 325 mg by mouth every 3 (three) days.   ceFAZolin IVPB Commonly known as:  ANCEF Inject 2 g into the vein every 8 (eight) hours. Indication:  MSSA bacteremia Last Day of Therapy:  02/14/17 Labs - Once weekly:  CBC/D and BMP, Labs - Every other week:  ESR and CRP   Fish Oil 1200 MG Caps Take by mouth every 3 (three) days.   glucosamine-chondroitin 500-400 MG tablet Take 1 tablet by mouth every 3 (three) days.   ibuprofen 200 MG tablet Commonly known as:  ADVIL,MOTRIN Take 400 mg by mouth every 6 (six) hours as needed for moderate pain.   lidocaine-prilocaine cream Commonly known as:  EMLA APPLY TO PORT SITE 1 HOUR PRIOR TO USE   MIRALAX PO Take by mouth daily.   multivitamin tablet Take 1 tablet by mouth every 3 (three) days.   prochlorperazine 10 MG tablet Commonly known as:  COMPAZINE Take 1 tablet (10 mg total) by mouth every 6 (six) hours as needed for nausea or vomiting.   saw palmetto 80 MG capsule Take 80 mg by mouth every 3 (three) days.            Home Infusion Instuctions        Start     Ordered   02/01/17 0000  Home infusion instructions [facility] May follow [place] Dosing Protocol; May administer Cathflo as needed to maintain patency of vascular access device.; Flushing of vascular access device: per [facility] Protocol: 0.9% NaCl pre/post medica...    Question Answer Comment  Instructions May follow [place] Dosing Protocol   Instructions May administer Cathflo as needed to maintain patency of vascular access device.   Instructions Flushing of vascular access device: per [facility] Protocol: 0.9% NaCl pre/post medication administration and prn patency; Heparin 100 u/ml, 3m for implanted ports and Heparin 10u/ml, 520mfor all other central venous catheters.   Instructions May follow [facility] Anaphylaxis Protocol for First Dose Administration in the home: 0.9% NaCl at 25-50 ml/hr to maintain IV access for  protocol meds. Epinephrine 0.3 ml IV/IM PRN and Benadryl 25-50 IV/IM PRN s/s of anaphylaxis.   Instructions [facility] Infusion Coordinator (RN) to assist per patient IV care needs in the home PRN.      02/01/17 1008       Discharge Care Instructions        Start     Ordered   02/01/17 0000  Home infusion instructions [facility] May follow [facility] Protocol; May administer Cathflo as needed to maintain patency of vascular access device.; Fl[facility] vascular access [facility]: 0.9% NaCl pre/post medica...    Question Answer Comment  Instructions May follow [facility]   Instructions May administer Cathflo as needed to maintain patency of vascular access device.   Instructions Flushing of vascular [facility]: 0.9% NaCl pre/post medication administration and prn patency; Heparin 100 u/ml, 41m39mor implanted ports and Heparin 10u/ml, 41ml31mr all other central venous catheters.   Instructions May follow [facility] Anaphylaxis Protocol for First Dose Administration in the home: 0.9% NaCl at 25-50 ml/hr to maintain IV  access for protocol meds. Epinephrine 0.3 ml IV/IM PRN and Benadryl 25-50 IV/IM PRN s/s of anaphylaxis.   Instructions [facility] Infusion Coordinator (RN) to assist per patient IV care needs in the home PRN.      02/01/17 1008   02/01/17 0000  ceFAZolin (ANCEF) IVPB  Every 8 hours     02/01/17 1008     Follow-up Information    [facility], [facility]-Home Follow up.   Why:  nurse to assist with IV antibiotics Contact information: [address] [place] [address] [phone]          No Known Allergies  Consultations:  ID  Oncology  IR  Procedures/Studies:  Dg Chest 2 View  Result Date: 01/27/2017 CLINICAL DATA:  Has port on left side was put in July. Pt has rectal cancer. Having swelling in his legs. exsmoker quit in the 90's. Asthma. Bronchitis 15 years ago.  Pneumonia 10 years ago. Currently has a fever. EXAM: CHEST  2 VIEW COMPARISON:  04/28/2014 FINDINGS: The cardiac silhouette is normal in size and configuration. No mediastinal or hilar masses. No evidence of adenopathy. Clear lungs.  No pleural effusion or pneumothorax. There is a right anterior chest wall power Port-A-Cath, new since prior study. Tip lies in the lower superior vena cava. Skeletal structures are unremarkable. IMPRESSION: No active cardiopulmonary disease. Electronically Signed   By: Lajean Manes M.D.   On: 01/27/2017 13:00   Ir Removal [employer] W/ [place] W/o Fl Mod Sed  Result Date: 01/31/2017 CLINICAL DATA:  Metastatic rectal carcinoma. Port catheter placed for chemotherapy 12/05/2016, has worked well. Due to bacteremia, port removal requested by ID. The port pocket site is normal on physical exam, no evidence of port pocket infection. EXAM: EXAM TUNNELED PORT CATHETER REMOVAL TECHNIQUE: The procedure, risks (including but not limited to bleeding, infection, organ damage ), benefits, and alternatives were explained to the patient. Questions regarding the procedure were encouraged and answered. The patient understands and consents to the procedure. Intravenous Fentanyl and Versed were administered as conscious sedation during continuous monitoring of the patient's level of consciousness and physiological / cardiorespiratory status by the radiology RN, with a total moderate sedation time of 13 minutes. Overlying skin prepped with chlorhexidine, draped in usual sterile fashion, infiltrated locally with 1% lidocaine. A small incision was made over the scar from previous placement. The port catheter was dissected free from the underlying soft tissues and removed intact. Hemostasis was achieved. The port pocket was closed with deep interrupted and subcuticular continuous 3-0 Monocryl sutures, then covered with Dermabond. The patient tolerated the procedure well. COMPLICATIONS: COMPLICATIONS None  immediate IMPRESSION: 1.  Technically successful tunneled Port catheter removal. Electronically Signed   By: Lucrezia Europe M.D.   On: 01/31/2017 14:48     Subjective: - no chest pain, shortness of breath, no abdominal pain, nausea or vomiting.   Discharge Exam: Vitals:   01/31/17 2200 02/01/17 0610  BP: (!) 120/95 99/70  Pulse: 85 74  Resp: 16 18  Temp: 98.2 F (36.8 C) 97.7 F (36.5 C)  SpO2: 100% 97%   Vitals:   01/31/17 1129 01/31/17 1400 01/31/17 2200 02/01/17 0610  BP: 110/64 (!) 107/57 (!) 120/95 99/70  Pulse: 86 89 85 74  Resp: '12 17 16 18  ' Temp:  98.1 F (36.7 C) 98.2 F (36.8 C) 97.7 F (36.5 C)  TempSrc:  Oral Oral Oral  SpO2: 100% 100% 100% 97%  Weight:      Height:  General: Pt is alert, awake, not in acute distress Cardiovascular: RRR, S1/S2 +, no rubs, no gallops Respiratory: CTA bilaterally, no wheezing, no rhonchi Abdominal: Soft, NT, ND, bowel sounds + Extremities: no edema, no cyanosis    The results of significant diagnostics from this hospitalization (including imaging, microbiology, ancillary and laboratory) are listed below for reference.     Microbiology: Recent Results (from the past 240 hour(s))  Culture, blood (single)     Status: Abnormal   Collection Time: 01/27/17 12:50 PM  Result Value Ref Range Status   Specimen Description BLOOD PORTA CATH  Final   Special Requests   Final    BOTTLES DRAWN AEROBIC AND ANAEROBIC Blood Culture adequate volume   Culture  Setup Time   Final    GRAM POSITIVE COCCI IN CLUSTERS AEROBIC BOTTLE ONLY CRITICAL RESULT CALLED TO, READ BACK BY AND VERIFIED WITH: S GOUGE,RN AT 1123 01/28/17 BY L BENFIELD Performed at [facility], [place] [address], [place], [place] [address]    Culture STAPHYLOCOCCUS AUREUS (A)  Final   Report Status 01/30/2017 FINAL  Final   Organism ID, Bacteria STAPHYLOCOCCUS AUREUS  Final      Susceptibility   Staphylococcus aureus - MIC*    CIPROFLOXACIN <=0.5 SENSITIVE  Sensitive     ERYTHROMYCIN <=0.25 SENSITIVE Sensitive     GENTAMICIN <=0.5 SENSITIVE Sensitive     OXACILLIN <=0.25 SENSITIVE Sensitive     TETRACYCLINE <=1 SENSITIVE Sensitive     VANCOMYCIN <=0.5 SENSITIVE Sensitive     TRIMETH/SULFA <=10 SENSITIVE Sensitive     CLINDAMYCIN <=0.25 SENSITIVE Sensitive     RIFAMPIN <=0.5 SENSITIVE Sensitive     Inducible Clindamycin NEGATIVE Sensitive     * STAPHYLOCOCCUS AUREUS  Blood Culture ID Panel (Reflexed)     Status: Abnormal   Collection Time: 01/27/17 12:50 PM  Result Value Ref Range Status   Enterococcus species NOT DETECTED NOT DETECTED Final   Vancomycin resistance NOT DETECTED NOT DETECTED Final   Listeria monocytogenes NOT DETECTED NOT DETECTED Final   Staphylococcus species DETECTED (A) NOT DETECTED Final    Comment: CRITICAL RESULT CALLED TO, READ BACK BY AND VERIFIED WITH: S GOUGE,RN AT 1123 01/28/17 BY L BENFIELD    Staphylococcus aureus DETECTED (A) NOT DETECTED Final    Comment: CRITICAL RESULT CALLED TO, READ BACK BY AND VERIFIED WITH: S GOUGE,RN AT 1123 01/28/17 BY L BENFIELD    Methicillin resistance NOT DETECTED NOT DETECTED Final   Streptococcus species NOT DETECTED NOT DETECTED Final   Streptococcus agalactiae NOT DETECTED NOT DETECTED Final   Streptococcus pneumoniae NOT DETECTED NOT DETECTED Final   Streptococcus pyogenes NOT DETECTED NOT DETECTED Final   Acinetobacter baumannii NOT DETECTED NOT DETECTED Final   Enterobacteriaceae species NOT DETECTED NOT DETECTED Final   Enterobacter cloacae complex NOT DETECTED NOT DETECTED Final   Escherichia coli NOT DETECTED NOT DETECTED Final   Klebsiella oxytoca NOT DETECTED NOT DETECTED Final   Klebsiella pneumoniae NOT DETECTED NOT DETECTED Final   Proteus species NOT DETECTED NOT DETECTED Final   Serratia marcescens NOT DETECTED NOT DETECTED Final   Carbapenem resistance NOT DETECTED NOT DETECTED Final   Haemophilus influenzae NOT DETECTED NOT DETECTED Final   Neisseria  meningitidis NOT DETECTED NOT DETECTED Final   Pseudomonas aeruginosa NOT DETECTED NOT DETECTED Final   Candida albicans NOT DETECTED NOT DETECTED Final   Candida glabrata NOT DETECTED NOT DETECTED Final   Candida krusei NOT DETECTED NOT DETECTED Final   Candida parapsilosis NOT  DETECTED NOT DETECTED Final   Candida tropicalis NOT DETECTED NOT DETECTED Final    Comment: Performed at [facility], [place] [address] [place], [place] [address]  Culture, blood (single)     Status: None   Collection Time: 01/27/17 12:51 PM  Result Value Ref Range Status   Specimen Description BLOOD LEFT ANTECUBITAL  Final   Special Requests   Final    BOTTLES DRAWN AEROBIC AND ANAEROBIC Blood Culture adequate volume   Culture   Final    NO GROWTH 5 DAYS Performed at [facility], [address], [place], [place] [address]    Report Status 02/01/2017 FINAL  Final  Culture, blood (routine x 2)     Status: None (Preliminary result)   Collection Time: 01/28/17  2:07 PM  Result Value Ref Range Status   Specimen Description LEFT ANTECUBITAL  Final   Special Requests   Final    BOTTLES DRAWN AEROBIC AND ANAEROBIC Blood Culture adequate volume   Culture   Final    NO GROWTH 4 DAYS Performed at [facility], [place] [address] [place], [place] [address]    Report Status PENDING  Incomplete  Culture, blood (routine x 2)     Status: None (Preliminary result)   Collection Time: 01/28/17  2:07 PM  Result Value Ref Range Status   Specimen Description PORTA CATH  Final   Special Requests   Final    BOTTLES DRAWN AEROBIC AND ANAEROBIC Blood Culture adequate volume   Culture   Final    NO GROWTH 4 DAYS Performed at [facility], [address], [place], [place] [address]    Report Status PENDING  Incomplete  Culture, blood (Routine X 2) w Reflex to ID Panel     Status: None (Preliminary result)   Collection Time: 01/29/17  9:54 AM  Result Value Ref Range Status   Specimen Description BLOOD  RIGHT ANTECUBITAL  Final   Special Requests   Final    BOTTLES DRAWN AEROBIC AND ANAEROBIC Blood Culture adequate volume   Culture   Final    NO GROWTH 3 DAYS Performed at [facility], [place] [address] [place],  [address]    Report Status PENDING  Incomplete  Culture, blood (Routine X 2) w Reflex to ID Panel     Status: None (Preliminary result)   Collection Time: 01/29/17 10:58 AM  Result Value Ref Range Status   Specimen Description BLOOD LEFT ARM  Final   Special Requests   Final    BOTTLES DRAWN AEROBIC AND ANAEROBIC Blood Culture adequate volume   Culture   Final    NO GROWTH 3 DAYS Performed at [facility], [place] [address], [place],  [address]    Report Status PENDING  Incomplete     Labs: BNP (last 3 results) No results for input(s): BNP in the last 8760 hours. Basic Metabolic Panel:  Recent Labs Lab 01/27/17 1250 01/28/17 1406 01/29/17 0835  NA 137 137 138  K 4.1 4.5 4.1  CL 106 107 108  CO2 '26 24 25  ' GLUCOSE 111* 94 151*  BUN '14 20 12  ' CREATININE 1.11 1.02 1.06  CALCIUM 10.0 9.7 9.4   Liver Function Tests:  Recent Labs Lab 01/27/17 1250  AST 29  ALT 28  ALKPHOS 57  BILITOT 0.7  PROT 6.4*  ALBUMIN 3.7   No results for input(s): LIPASE, AMYLASE in the last 168 hours. No results  for input(s): AMMONIA in the last 168 hours. CBC:  Recent Labs Lab 01/27/17 1250 01/28/17 1406 01/29/17 0835  WBC 7.6 10.0 7.2  NEUTROABS 5.9 7.7  --   HGB 12.5* 12.5* 11.0*  HCT 36.4* 37.1* 32.3*  MCV 91.2 91.8 90.5  PLT 123* 125* 120*   Cardiac Enzymes: No results for input(s): CKTOTAL, CKMB, CKMBINDEX, TROPONINI in the last 168 hours. BNP: Invalid input(s): POCBNP CBG: No results for input(s): GLUCAP in the last 168 hours. D-Dimer No results for input(s): DDIMER in the last 72 hours. Hgb A1c No results for input(s): HGBA1C in the last 72 hours. Lipid Profile No results for input(s): CHOL, HDL, LDLCALC, TRIG, CHOLHDL, LDLDIRECT in  the last 72 hours. Thyroid function studies No results for input(s): TSH, T4TOTAL, T3FREE, THYROIDAB in the last 72 hours.  Invalid input(s): FREET3 Anemia work up No results for input(s): VITAMINB12, FOLATE, FERRITIN, TIBC, IRON, RETICCTPCT in the last 72 hours. Urinalysis    Component Value Date/Time   COLORURINE YELLOW 11/01/2016 0949   APPEARANCEUR CLEAR 11/01/2016 0949   LABSPEC <=1.005 (A) 11/01/2016 0949   PHURINE 6.0 11/01/2016 0949   GLUCOSEU NEGATIVE 11/01/2016 0949   HGBUR TRACE-INTACT (A) 11/01/2016 0949   BILIRUBINUR NEGATIVE 11/01/2016 0949   KETONESUR NEGATIVE 11/01/2016 0949   PROTEINUR Negative 12/20/2016 1036   UROBILINOGEN 0.2 11/01/2016 0949   NITRITE NEGATIVE 11/01/2016 0949   LEUKOCYTESUR NEGATIVE 11/01/2016 0949   Sepsis Labs Invalid input(s): PROCALCITONIN,  WBC,  LACTICIDVEN Microbiology Recent Results (from the past 240 hour(s))  Culture, blood (single)     Status: Abnormal   Collection Time: 01/27/17 12:50 PM  Result Value Ref Range Status   Specimen Description BLOOD PORTA CATH  Final   Special Requests   Final    BOTTLES DRAWN AEROBIC AND ANAEROBIC Blood Culture adequate volume   Culture  Setup Time   Final    GRAM POSITIVE COCCI IN CLUSTERS AEROBIC BOTTLE ONLY CRITICAL RESULT CALLED TO, READ BACK BY AND VERIFIED WITH: S GOUGE,RN AT 1123 01/28/17 BY L BENFIELD Performed at [facility], [place] [address] [place], [place] [address]    Culture STAPHYLOCOCCUS AUREUS (A)  Final   Report Status 01/30/2017 FINAL  Final   Organism ID, Bacteria STAPHYLOCOCCUS AUREUS  Final      Susceptibility   Staphylococcus aureus - MIC*    CIPROFLOXACIN <=0.5 SENSITIVE Sensitive     ERYTHROMYCIN <=0.25 SENSITIVE Sensitive     GENTAMICIN <=0.5 SENSITIVE Sensitive     OXACILLIN <=0.25 SENSITIVE Sensitive     TETRACYCLINE <=1 SENSITIVE Sensitive     VANCOMYCIN <=0.5 SENSITIVE Sensitive     TRIMETH/SULFA <=10 SENSITIVE Sensitive     CLINDAMYCIN <=0.25  SENSITIVE Sensitive     RIFAMPIN <=0.5 SENSITIVE Sensitive     Inducible Clindamycin NEGATIVE Sensitive     * STAPHYLOCOCCUS AUREUS  Blood Culture ID Panel (Reflexed)     Status: Abnormal   Collection Time: 01/27/17 12:50 PM  Result Value Ref Range Status   Enterococcus species NOT DETECTED NOT DETECTED Final   Vancomycin resistance NOT DETECTED NOT DETECTED Final   Listeria monocytogenes NOT DETECTED NOT DETECTED Final   Staphylococcus species DETECTED (A) NOT DETECTED Final    Comment: CRITICAL RESULT CALLED TO, READ BACK BY AND VERIFIED WITH: S GOUGE,RN AT 1123 01/28/17 BY L BENFIELD    Staphylococcus aureus DETECTED (A) NOT DETECTED Final    Comment: CRITICAL RESULT CALLED TO, READ BACK BY AND VERIFIED WITH: S GOUGE,RN AT 1123  01/28/17 BY L BENFIELD    Methicillin resistance NOT DETECTED NOT DETECTED Final   Streptococcus species NOT DETECTED NOT DETECTED Final   Streptococcus agalactiae NOT DETECTED NOT DETECTED Final   Streptococcus pneumoniae NOT DETECTED NOT DETECTED Final   Streptococcus pyogenes NOT DETECTED NOT DETECTED Final   Acinetobacter baumannii NOT DETECTED NOT DETECTED Final   Enterobacteriaceae species NOT DETECTED NOT DETECTED Final   Enterobacter cloacae complex NOT DETECTED NOT DETECTED Final   Escherichia coli NOT DETECTED NOT DETECTED Final   Klebsiella oxytoca NOT DETECTED NOT DETECTED Final   Klebsiella pneumoniae NOT DETECTED NOT DETECTED Final   Proteus species NOT DETECTED NOT DETECTED Final   Serratia marcescens NOT DETECTED NOT DETECTED Final   Carbapenem resistance NOT DETECTED NOT DETECTED Final   Haemophilus influenzae NOT DETECTED NOT DETECTED Final   Neisseria meningitidis NOT DETECTED NOT DETECTED Final   Pseudomonas aeruginosa NOT DETECTED NOT DETECTED Final   Candida albicans NOT DETECTED NOT DETECTED Final   Candida glabrata NOT DETECTED NOT DETECTED Final   Candida krusei NOT DETECTED NOT DETECTED Final   Candida parapsilosis NOT DETECTED  NOT DETECTED Final   Candida tropicalis NOT DETECTED NOT DETECTED Final    Comment: Performed at [facility], [place] [address] [place], [place] [address]  Culture, blood (single)     Status: None   Collection Time: 01/27/17 12:51 PM  Result Value Ref Range Status   Specimen Description BLOOD LEFT ANTECUBITAL  Final   Special Requests   Final    BOTTLES DRAWN AEROBIC AND ANAEROBIC Blood Culture adequate volume   Culture   Final    NO GROWTH 5 DAYS Performed at [facility], [address] [place], [place] [address]    Report Status 02/01/2017 FINAL  Final  Culture, blood (routine x 2)     Status: None (Preliminary result)   Collection Time: 01/28/17  2:07 PM  Result Value Ref Range Status   Specimen Description LEFT ANTECUBITAL  Final   Special Requests   Final    BOTTLES DRAWN AEROBIC AND ANAEROBIC Blood Culture adequate volume   Culture   Final    NO GROWTH 4 DAYS Performed at [facility], [place] [address], [place], [place] [address]    Report Status PENDING  Incomplete  Culture, blood (routine x 2)     Status: None (Preliminary result)   Collection Time: 01/28/17  2:07 PM  Result Value Ref Range Status   Specimen Description PORTA CATH  Final   Special Requests   Final    BOTTLES DRAWN AEROBIC AND ANAEROBIC Blood Culture adequate volume   Culture   Final    NO GROWTH 4 DAYS Performed at [facility], [address] [place], [place] [address]    Report Status PENDING  Incomplete  Culture, blood (Routine X 2) w Reflex to ID Panel     Status: None (Preliminary result)   Collection Time: 01/29/17  9:54 AM  Result Value Ref Range Status   Specimen Description BLOOD RIGHT ANTECUBITAL  Final   Special Requests   Final    BOTTLES DRAWN AEROBIC AND ANAEROBIC Blood Culture adequate volume   Culture   Final    NO GROWTH 3 DAYS Performed at [facility], [place] [address], [place],  [address]    Report Status PENDING  Incomplete  Culture, blood  (Routine X 2) w Reflex to ID Panel     Status: None (Preliminary  result)   Collection Time: 01/29/17 10:58 AM  Result Value Ref Range Status   Specimen Description BLOOD LEFT ARM  Final   Special Requests   Final    BOTTLES DRAWN AEROBIC AND ANAEROBIC Blood Culture adequate volume   Culture   Final    NO GROWTH 3 DAYS Performed at [facility], [address] [place], [place] [address]    Report Status PENDING  Incomplete     Time coordinating discharge: 40 minutes  SIGNED:  Marzetta Board, MD  [facility] 02/01/2017, 3:37 PM Pager [phone]  If 7PM-7AM, please contact night-coverage [email or web address] Password [email or web address]

## 2017-02-01 NOTE — Progress Notes (Signed)
Pt was discharged home today. Instructions were reviewed with patient, and questions were answered. Pt was taken to main entrance via wheelchair by NT.  

## 2017-02-01 NOTE — Progress Notes (Signed)
Discharge planning, spoke with patient and sister at beside. Chose [facility] for [facility] services, contacted [facility] for referral. Needs teaching for IV abx and PICC care. [facility] to f/u with patient. [phone]

## 2017-02-02 LAB — CULTURE, BLOOD (ROUTINE X 2)
CULTURE: NO GROWTH
Culture: NO GROWTH
Special Requests: ADEQUATE
Special Requests: ADEQUATE

## 2017-02-03 LAB — CULTURE, BLOOD (ROUTINE X 2)
Culture: NO GROWTH
Culture: NO GROWTH
Special Requests: ADEQUATE
Special Requests: ADEQUATE

## 2017-02-06 ENCOUNTER — Other Ambulatory Visit: Payer: Self-pay | Admitting: Pharmacist

## 2017-02-07 ENCOUNTER — Ambulatory Visit: Payer: Federal, State, Local not specified - PPO

## 2017-02-07 ENCOUNTER — Other Ambulatory Visit: Payer: Self-pay | Admitting: Nurse Practitioner

## 2017-02-07 ENCOUNTER — Other Ambulatory Visit (HOSPITAL_BASED_OUTPATIENT_CLINIC_OR_DEPARTMENT_OTHER): Payer: Federal, State, Local not specified - PPO

## 2017-02-07 ENCOUNTER — Ambulatory Visit (HOSPITAL_BASED_OUTPATIENT_CLINIC_OR_DEPARTMENT_OTHER): Payer: Federal, State, Local not specified - PPO | Admitting: Nurse Practitioner

## 2017-02-07 VITALS — BP 113/86 | HR 73 | Temp 97.6°F | Resp 18 | Ht 75.0 in | Wt 205.1 lb

## 2017-02-07 DIAGNOSIS — C770 Secondary and unspecified malignant neoplasm of lymph nodes of head, face and neck: Secondary | ICD-10-CM | POA: Diagnosis not present

## 2017-02-07 DIAGNOSIS — C20 Malignant neoplasm of rectum: Secondary | ICD-10-CM

## 2017-02-07 DIAGNOSIS — L03115 Cellulitis of right lower limb: Secondary | ICD-10-CM | POA: Diagnosis not present

## 2017-02-07 DIAGNOSIS — C801 Malignant (primary) neoplasm, unspecified: Secondary | ICD-10-CM

## 2017-02-07 DIAGNOSIS — C674 Malignant neoplasm of posterior wall of bladder: Secondary | ICD-10-CM | POA: Diagnosis not present

## 2017-02-07 LAB — COMPREHENSIVE METABOLIC PANEL
ALBUMIN: 3.1 g/dL — AB (ref 3.5–5.0)
ALK PHOS: 75 U/L (ref 40–150)
ALT: 7 U/L (ref 0–55)
AST: 25 U/L (ref 5–34)
Anion Gap: 4 mEq/L (ref 3–11)
BILIRUBIN TOTAL: 0.28 mg/dL (ref 0.20–1.20)
BUN: 10.3 mg/dL (ref 7.0–26.0)
CALCIUM: 10.5 mg/dL — AB (ref 8.4–10.4)
CO2: 28 mEq/L (ref 22–29)
Chloride: 107 mEq/L (ref 98–109)
Creatinine: 0.9 mg/dL (ref 0.7–1.3)
EGFR: 84 mL/min/{1.73_m2} — AB (ref >90)
GLUCOSE: 88 mg/dL (ref 70–140)
POTASSIUM: 4.4 meq/L (ref 3.5–5.1)
SODIUM: 139 meq/L (ref 136–145)
TOTAL PROTEIN: 6.5 g/dL (ref 6.4–8.3)

## 2017-02-07 LAB — CBC WITH DIFFERENTIAL/PLATELET
BASO%: 2.2 % — ABNORMAL HIGH (ref 0.0–2.0)
BASOS ABS: 0.1 10*3/uL (ref 0.0–0.1)
EOS ABS: 0.3 10*3/uL (ref 0.0–0.5)
EOS%: 7.2 % — ABNORMAL HIGH (ref 0.0–7.0)
HEMATOCRIT: 37.2 % — AB (ref 38.4–49.9)
HEMOGLOBIN: 12.4 g/dL — AB (ref 13.0–17.1)
LYMPH#: 1.2 10*3/uL (ref 0.9–3.3)
LYMPH%: 28 % (ref 14.0–49.0)
MCH: 31.2 pg (ref 27.2–33.4)
MCHC: 33.2 g/dL (ref 32.0–36.0)
MCV: 93.7 fL (ref 79.3–98.0)
MONO#: 0.3 10*3/uL (ref 0.1–0.9)
MONO%: 7.3 % (ref 0.0–14.0)
NEUT%: 55.3 % (ref 39.0–75.0)
NEUTROS ABS: 2.4 10*3/uL (ref 1.5–6.5)
PLATELETS: 190 10*3/uL (ref 140–400)
RBC: 3.97 10*6/uL — ABNORMAL LOW (ref 4.20–5.82)
RDW: 18.1 % — AB (ref 11.0–14.6)
WBC: 4.3 10*3/uL (ref 4.0–10.3)

## 2017-02-07 NOTE — Progress Notes (Addendum)
[place] OFFICE PROGRESS NOTE   Diagnosis:  Rectal cancer  INTERVAL HISTORY:   Nicholas Suarez returns as scheduled. He completed cycle 4 FOLFOX 01/17/2017. Oxaliplatin was held due to thrombocytopenia. He was hospitalized 01/28/2017 through 02/01/2017 with right lower extremity cellulitis. One of 2 blood cultures returned positive for MSSA bacteremia. Port-A-Cath was removed and PICC line placed. TEE negative for vegetation. Discharged home on Ancef 02/01/2017.  The right leg pain is better. The redness has improved significantly. No fever. He overall feels well. No nausea or vomiting. No mouth sores. No frank diarrhea. No numbness or tingling in his hands or feet.  Objective:  Vital signs in last 24 hours:  Blood pressure 113/86, pulse 73, temperature 97.6 F (36.4 C), temperature source Oral, resp. rate 18, height '6\' 3"'  (1.905 m), weight 205 lb 1.6 oz (93 kg), SpO2 100 %.    HEENT: No thrush or ulcers. Resp: Lungs clear bilaterally. Cardio: Regular rate and rhythm. GI: Abdomen soft and nontender. No hepatomegaly. Vascular: Trace edema throughout the right leg. No significant erythema. Port-A-Cath without erythema.  Lab Results:  Lab Results  Component Value Date   WBC 4.3 02/07/2017   HGB 12.4 (L) 02/07/2017   HCT 37.2 (L) 02/07/2017   MCV 93.7 02/07/2017   PLT 190 02/07/2017   NEUTROABS 2.4 02/07/2017    Imaging:  No results found.  Medications: I have reviewed the patient's current medications.  Assessment/Plan: 1. Rectal cancer  MRI lumbar spine 10/20/2016 with bilateral hydronephrosis and retroperitoneal adenopathy  CT abdomen/pelvis 10/20/2016 with extensive retroperitoneal process with marked interstitial thickening and lymphadenopathy; bilateral hydronephrosis; similar ill-defined soft tissue density and skin thickening around the umbilicus; diffuse bladder wall thickening slightly asymmetric at the bladder dome but no obvious bladder  mass  Biopsy para-aortic lymph node 11/10/2016-metastatic adenocarcinoma consistent with GI primary  11/23/2016 CEA elevated, CA-19-9 normal  Colonoscopy 11/27/2016-fungating infiltrative nonobstructing mass in the distal rectum involving the anal verge. The rectum was fixed and frozen in the pelvis precluding passage of scope beyond the distal sigmoid colon. 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  Foundation 1-microsatellite stable; tumor mutational burden 5; NOTCH3amplification; NOTCH3rearrangement exon 14  Upper endoscopy 11/27/2016-normal.  PET scan 06/22/2018showed minimal activity in the primary rectal carcinoma, diffuse wall thickening; mild activity associate with retroperitoneal lymph nodes; persistent haziness within the retroperitoneum. No evidence of liver metastasis or distant metastasis.  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  Cycle 2 FOLFOX 12/20/2016  Cycle 3 FOLFOX 01/03/2017 (oxaliplatin dose reduced secondary to thrombocytopenia)   Cycle 4 FOLFOX 01/17/2017 (oxaliplatin held due to thrombocytopenia) 2. Retroperitoneal lymphadenopathy secondary to #1 3. Bilateral hydronephrosis status post evaluation by urology 4. Asymmetry of the bladder dome on CT 10/20/2016 status post cystoscopy with biopsy planned 12/11/2016 (Dr. 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 Change in bowel habits, secondary to #1-improved. 6. Right leg edema, question due to lymphatic obstruction;venous Doppler negative for DVT 11/23/2016. 7. Port-A-Cath placement 12/05/2016 8. Thrombocytopenia secondary to chemotherapy; oxaliplatin dose reduced with cycle 3 FOLFOX. Progressive thrombocytopenia  01/17/2017; oxaliplatin held 01/17/2017. 9. Right lower extremity cellulitis 01/27/2017-blood culture positive for methicillin sensitive staph aureus; Port-A-Cath removed. PICC line placed. TEE 01/30/2017 without evidence of vegetation. 14 days of Ancef recommended after Port-A-Cath removal with end date 02/14/2017.    Disposition: Mr.  Suarez appears stable. He has completed 4 cycles of FOLFOX. Cycle 5 is on hold pending completion of antibiotics for recent right lower extremity cellulitis/MSSA bacteremia. He is scheduled to complete the course of antibiotics on 02/14/2017. We will see him in follow-up on 02/15/2017 with plans to proceed with cycle 5 FOLFOX that day if he is otherwise doing well. The plan is for restaging CT scans after he completed cycle 5. He will contact the office prior to his next visit with any problems.  Patient seen with Dr. Benay Spice.    Ned Card ANP/GNP-BC   02/07/2017  11:45 AM This was a shared visit with Lattie Haw, Nicholas Suarez was interviewed and examined. The right leg cellulitis appears to have resolved. He will complete the course of IV antibiotics and return for FOLFOX chemotherapy next week. He will undergo a restaging CT evaluation after the next cycle of chemotherapy.  Julieanne Manson, M.D.

## 2017-02-08 ENCOUNTER — Telehealth: Payer: Self-pay | Admitting: Nurse Practitioner

## 2017-02-08 ENCOUNTER — Telehealth: Payer: Self-pay | Admitting: Oncology

## 2017-02-08 ENCOUNTER — Telehealth: Payer: Self-pay | Admitting: *Deleted

## 2017-02-08 ENCOUNTER — Ambulatory Visit: Payer: Federal, State, Local not specified - PPO | Admitting: Nurse Practitioner

## 2017-02-08 ENCOUNTER — Other Ambulatory Visit: Payer: Federal, State, Local not specified - PPO

## 2017-02-08 NOTE — Telephone Encounter (Signed)
lvm to inform pt of lab only appt 9/5 at 1130 pr sch msg

## 2017-02-08 NOTE — Telephone Encounter (Signed)
Telephone call to patient to discuss lab results as directed below. Patient verbalized an understanding and is waiting for lab, flush and treatment schedule to be completed for 9/6. Scheduling is addressing this.

## 2017-02-08 NOTE — Telephone Encounter (Signed)
Spoke with patient regarding his appts that have been added for 9/6.

## 2017-02-08 NOTE — Telephone Encounter (Signed)
-----   Message from [name], NP sent at 02/07/2017  3:41 PM EDT ----- Please let him know the calcium level was mildly elevated on labs today. Is he taking calcium? Repeat chemistry panel in one week.

## 2017-02-09 DIAGNOSIS — Z87891 Personal history of nicotine dependence: Secondary | ICD-10-CM | POA: Diagnosis not present

## 2017-02-09 DIAGNOSIS — D696 Thrombocytopenia, unspecified: Secondary | ICD-10-CM | POA: Diagnosis not present

## 2017-02-09 DIAGNOSIS — Z5181 Encounter for therapeutic drug level monitoring: Secondary | ICD-10-CM | POA: Diagnosis not present

## 2017-02-09 DIAGNOSIS — C20 Malignant neoplasm of rectum: Secondary | ICD-10-CM | POA: Diagnosis not present

## 2017-02-09 DIAGNOSIS — R7881 Bacteremia: Secondary | ICD-10-CM | POA: Diagnosis not present

## 2017-02-09 DIAGNOSIS — L03115 Cellulitis of right lower limb: Secondary | ICD-10-CM | POA: Diagnosis not present

## 2017-02-09 DIAGNOSIS — D649 Anemia, unspecified: Secondary | ICD-10-CM | POA: Diagnosis not present

## 2017-02-09 DIAGNOSIS — Z7982 Long term (current) use of aspirin: Secondary | ICD-10-CM | POA: Diagnosis not present

## 2017-02-09 DIAGNOSIS — Z452 Encounter for adjustment and management of vascular access device: Secondary | ICD-10-CM | POA: Diagnosis not present

## 2017-02-09 DIAGNOSIS — B9561 Methicillin susceptible Staphylococcus aureus infection as the cause of diseases classified elsewhere: Secondary | ICD-10-CM | POA: Diagnosis not present

## 2017-02-14 ENCOUNTER — Other Ambulatory Visit: Payer: Federal, State, Local not specified - PPO

## 2017-02-15 ENCOUNTER — Other Ambulatory Visit (HOSPITAL_BASED_OUTPATIENT_CLINIC_OR_DEPARTMENT_OTHER): Payer: Federal, State, Local not specified - PPO

## 2017-02-15 ENCOUNTER — Telehealth: Payer: Self-pay

## 2017-02-15 ENCOUNTER — Ambulatory Visit (HOSPITAL_BASED_OUTPATIENT_CLINIC_OR_DEPARTMENT_OTHER): Payer: Federal, State, Local not specified - PPO

## 2017-02-15 ENCOUNTER — Ambulatory Visit (HOSPITAL_BASED_OUTPATIENT_CLINIC_OR_DEPARTMENT_OTHER): Payer: Federal, State, Local not specified - PPO | Admitting: Nurse Practitioner

## 2017-02-15 ENCOUNTER — Ambulatory Visit: Payer: Federal, State, Local not specified - PPO

## 2017-02-15 VITALS — BP 114/62 | HR 68 | Temp 97.7°F | Resp 18 | Ht 75.0 in | Wt 205.1 lb

## 2017-02-15 DIAGNOSIS — Z95828 Presence of other vascular implants and grafts: Secondary | ICD-10-CM

## 2017-02-15 DIAGNOSIS — C674 Malignant neoplasm of posterior wall of bladder: Secondary | ICD-10-CM | POA: Diagnosis not present

## 2017-02-15 DIAGNOSIS — C770 Secondary and unspecified malignant neoplasm of lymph nodes of head, face and neck: Secondary | ICD-10-CM

## 2017-02-15 DIAGNOSIS — C20 Malignant neoplasm of rectum: Secondary | ICD-10-CM

## 2017-02-15 DIAGNOSIS — M7989 Other specified soft tissue disorders: Secondary | ICD-10-CM | POA: Diagnosis not present

## 2017-02-15 DIAGNOSIS — Z5111 Encounter for antineoplastic chemotherapy: Secondary | ICD-10-CM

## 2017-02-15 LAB — CBC WITH DIFFERENTIAL/PLATELET
BASO%: 1 % (ref 0.0–2.0)
BASOS ABS: 0 10*3/uL (ref 0.0–0.1)
EOS ABS: 0.2 10*3/uL (ref 0.0–0.5)
EOS%: 4.9 % (ref 0.0–7.0)
HCT: 37.7 % — ABNORMAL LOW (ref 38.4–49.9)
HGB: 12.5 g/dL — ABNORMAL LOW (ref 13.0–17.1)
LYMPH%: 30 % (ref 14.0–49.0)
MCH: 31.3 pg (ref 27.2–33.4)
MCHC: 33.2 g/dL (ref 32.0–36.0)
MCV: 94.3 fL (ref 79.3–98.0)
MONO#: 0.4 10*3/uL (ref 0.1–0.9)
MONO%: 9.7 % (ref 0.0–14.0)
NEUT#: 2.1 10*3/uL (ref 1.5–6.5)
NEUT%: 54.4 % (ref 39.0–75.0)
PLATELETS: 162 10*3/uL (ref 140–400)
RBC: 4 10*6/uL — ABNORMAL LOW (ref 4.20–5.82)
RDW: 16.5 % — ABNORMAL HIGH (ref 11.0–14.6)
WBC: 3.9 10*3/uL — ABNORMAL LOW (ref 4.0–10.3)
lymph#: 1.2 10*3/uL (ref 0.9–3.3)

## 2017-02-15 LAB — COMPREHENSIVE METABOLIC PANEL
ALT: <6 U/L (ref 0–55)
ANION GAP: 5 meq/L (ref 3–11)
AST: 23 U/L (ref 5–34)
Albumin: 3.3 g/dL — ABNORMAL LOW (ref 3.5–5.0)
Alkaline Phosphatase: 78 U/L (ref 40–150)
BILIRUBIN TOTAL: 0.32 mg/dL (ref 0.20–1.20)
BUN: 10.6 mg/dL (ref 7.0–26.0)
CO2: 27 meq/L (ref 22–29)
CREATININE: 0.9 mg/dL (ref 0.7–1.3)
Calcium: 10.6 mg/dL — ABNORMAL HIGH (ref 8.4–10.4)
Chloride: 108 mEq/L (ref 98–109)
EGFR: 89 mL/min/{1.73_m2} — AB (ref >90)
GLUCOSE: 113 mg/dL (ref 70–140)
POTASSIUM: 4.2 meq/L (ref 3.5–5.1)
SODIUM: 139 meq/L (ref 136–145)
Total Protein: 6.7 g/dL (ref 6.4–8.3)

## 2017-02-15 LAB — CEA (IN HOUSE-CHCC): CEA (CHCC-IN HOUSE): 11.29 ng/mL — AB (ref 0.00–5.00)

## 2017-02-15 MED ORDER — SODIUM CHLORIDE 0.9 % IV SOLN
2400.0000 mg/m2 | INTRAVENOUS | Status: DC
  Administered 2017-02-15: 5350 mg via INTRAVENOUS
  Filled 2017-02-15: qty 107

## 2017-02-15 MED ORDER — SODIUM CHLORIDE 0.9% FLUSH
10.0000 mL | Freq: Once | INTRAVENOUS | Status: AC
  Administered 2017-02-15: 10 mL
  Filled 2017-02-15: qty 10

## 2017-02-15 MED ORDER — FLUOROURACIL CHEMO INJECTION 2.5 GM/50ML
400.0000 mg/m2 | Freq: Once | INTRAVENOUS | Status: AC
  Administered 2017-02-15: 900 mg via INTRAVENOUS
  Filled 2017-02-15: qty 18

## 2017-02-15 MED ORDER — PALONOSETRON HCL INJECTION 0.25 MG/5ML
0.2500 mg | Freq: Once | INTRAVENOUS | Status: AC
  Administered 2017-02-15: 0.25 mg via INTRAVENOUS

## 2017-02-15 MED ORDER — DEXAMETHASONE SODIUM PHOSPHATE 10 MG/ML IJ SOLN
INTRAMUSCULAR | Status: AC
  Filled 2017-02-15: qty 1

## 2017-02-15 MED ORDER — OXALIPLATIN CHEMO INJECTION 100 MG/20ML
67.0000 mg/m2 | Freq: Once | INTRAVENOUS | Status: AC
  Administered 2017-02-15: 150 mg via INTRAVENOUS
  Filled 2017-02-15: qty 10

## 2017-02-15 MED ORDER — LEUCOVORIN CALCIUM INJECTION 350 MG
400.0000 mg/m2 | Freq: Once | INTRAVENOUS | Status: AC
  Administered 2017-02-15: 892 mg via INTRAVENOUS
  Filled 2017-02-15: qty 44.6

## 2017-02-15 MED ORDER — DEXAMETHASONE SODIUM PHOSPHATE 10 MG/ML IJ SOLN
10.0000 mg | Freq: Once | INTRAMUSCULAR | Status: AC
  Administered 2017-02-15: 10 mg via INTRAVENOUS

## 2017-02-15 MED ORDER — HEPARIN SOD (PORK) LOCK FLUSH 100 UNIT/ML IV SOLN
500.0000 [IU] | Freq: Once | INTRAVENOUS | Status: DC | PRN
  Filled 2017-02-15: qty 5

## 2017-02-15 MED ORDER — DEXTROSE 5 % IV SOLN
Freq: Once | INTRAVENOUS | Status: AC
  Administered 2017-02-15: 10:00:00 via INTRAVENOUS

## 2017-02-15 MED ORDER — PALONOSETRON HCL INJECTION 0.25 MG/5ML
INTRAVENOUS | Status: AC
  Filled 2017-02-15: qty 5

## 2017-02-15 MED ORDER — SODIUM CHLORIDE 0.9% FLUSH
10.0000 mL | INTRAVENOUS | Status: DC | PRN
  Filled 2017-02-15: qty 10

## 2017-02-15 NOTE — Progress Notes (Signed)
[place] OFFICE PROGRESS NOTE   Diagnosis:  Rectal cancer  INTERVAL HISTORY:   Nicholas Suarez returns as scheduled. He completed cycle 4 FOLFOX 01/17/2017. Oxaliplatin was held due to thrombocytopenia. Cycle 5 was held 02/07/2017 due to recent right lower extremity cellulitis/MSSA bacteremia, need to complete course of antibiotics.  He reports completing the course of antibiotics yesterday. He denies fever. He overall is feeling well. He denies nausea/vomiting. No mouth sores. No diarrhea. He denies neuropathy symptoms. Right leg continues to be swollen.  Objective:  Vital signs in last 24 hours:  Blood pressure 114/62, pulse 68, temperature 97.7 F (36.5 C), temperature source Oral, resp. rate 18, height '6\' 3"'  (1.905 m), weight 205 lb 1.6 oz (93 kg), SpO2 100 %.    HEENT: No thrush or ulcers. Resp: Lungs clear bilaterally. Cardio: Regular rate and rhythm. GI: Abdomen soft and nontender. No hepatomegaly. Vascular: Trace edema throughout the right leg. No significant erythema. Neuro: Vibratory sense minimally decreased over the fingertips per tuning fork exam.  Left upper extremity PICC site is without erythema.   Lab Results:  Lab Results  Component Value Date   WBC 3.9 (L) 02/15/2017   HGB 12.5 (L) 02/15/2017   HCT 37.7 (L) 02/15/2017   MCV 94.3 02/15/2017   PLT 162 02/15/2017   NEUTROABS 2.1 02/15/2017    Imaging:  No results found.  Medications: I have reviewed the patient's current medications.  Assessment/Plan: 1. Rectal cancer  MRI lumbar spine 10/20/2016 with bilateral hydronephrosis and retroperitoneal adenopathy  CT abdomen/pelvis 10/20/2016 with extensive retroperitoneal process with marked interstitial thickening and lymphadenopathy; bilateral hydronephrosis; similar ill-defined soft tissue density and skin thickening around the umbilicus; diffuse bladder wall thickening slightly asymmetric at the bladder dome but no obvious bladder  mass  Biopsy para-aortic lymph node 11/10/2016-metastatic adenocarcinoma consistent with GI primary  11/23/2016 CEA elevated, CA-19-9 normal  Colonoscopy 11/27/2016-fungating infiltrative nonobstructing mass in the distal rectum involving the anal verge. The rectum was fixed and frozen in the pelvis precluding passage of scope beyond the distal sigmoid colon. 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  Foundation 1-microsatellite stable; tumor mutational burden 5; NOTCH3amplification; NOTCH3rearrangement exon 14  Upper endoscopy 11/27/2016-normal.  PET scan 06/22/2018showed minimal activity in the primary rectal carcinoma, diffuse wall thickening; mild activity associate with retroperitoneal lymph nodes; persistent haziness within the retroperitoneum. No evidence of liver metastasis or distant metastasis.  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  Cycle 2 FOLFOX 12/20/2016  Cycle 3 FOLFOX 01/03/2017 (oxaliplatin dose reduced secondary to thrombocytopenia)   Cycle 4 FOLFOX 01/17/2017 (oxaliplatin held due to thrombocytopenia)  Cycle 5 FOLFOX 02/15/2017 2. Retroperitoneal lymphadenopathy secondary to #1 3. Bilateral hydronephrosis status post evaluation by urology 4. Asymmetry of the bladder dome on CT 10/20/2016 status post cystoscopy with biopsy planned 12/11/2016 (Dr. 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 Change in bowel habits, secondary to #1-improved. 6. Right leg edema, question due to lymphatic obstruction;venous Doppler negative for DVT 11/23/2016. 7. Port-A-Cath placement 12/05/2016 8. Thrombocytopenia secondary to chemotherapy; oxaliplatin dose reduced with cycle 3 FOLFOX.  Progressive thrombocytopenia 01/17/2017; oxaliplatin held 01/17/2017. 9. Right lower extremity cellulitis 01/27/2017-blood culture positive for methicillin sensitive staph aureus; Port-A-Cath removed. PICC line placed. TEE 01/30/2017 without evidence of vegetation. 14 days of Ancef recommended after Port-A-Cath removal with end date 02/14/2017.  Disposition: Nicholas Suarez appears stable. He has completed 4 cycles of FOLFOX. Plan to proceed with cycle 5 today as scheduled. The plan is for restaging CTs 02/26/2017. He will return for a follow-up visit 03/01/2017. He will contact the office in the interim with any problems.  Plan reviewed with Dr. Benay Spice.    Ned Card ANP/GNP-BC   02/15/2017  9:58 AM

## 2017-02-15 NOTE — Telephone Encounter (Signed)
Gave patient avs, and caleder for 9/20 and 10/3 per los.

## 2017-02-15 NOTE — Patient Instructions (Signed)
[facility] Discharge Instructions for Patients Receiving Chemotherapy  Today you received the following chemotherapy agents: Oxaliplatin, Leucovorin and Adrucil.  To help prevent nausea and vomiting after your treatment, we encourage you to take your nausea medication as directed. No Zofran for 3 days. Take Compazine instead.    If you develop nausea and vomiting that is not controlled by your nausea medication, call the clinic.   BELOW ARE SYMPTOMS THAT SHOULD BE REPORTED IMMEDIATELY:  *FEVER GREATER THAN 100.5 F  *CHILLS WITH OR WITHOUT FEVER  NAUSEA AND VOMITING THAT IS NOT CONTROLLED WITH YOUR NAUSEA MEDICATION  *UNUSUAL SHORTNESS OF BREATH  *UNUSUAL BRUISING OR BLEEDING  TENDERNESS IN MOUTH AND THROAT WITH OR WITHOUT PRESENCE OF ULCERS  *URINARY PROBLEMS  *BOWEL PROBLEMS  UNUSUAL RASH Items with * indicate a potential emergency and should be followed up as soon as possible.  Feel free to call the clinic you have any questions or concerns. The clinic phone number is [phone].  Please show the CHEMO ALERT CARD at check-in to the Emergency Department and triage nurse.   

## 2017-02-15 NOTE — Patient Instructions (Signed)
Implanted Port Home Guide An implanted port is a type of central line that is placed under the skin. Central lines are used to provide IV access when treatment or nutrition needs to be given through a person's veins. Implanted ports are used for long-term IV access. An implanted port may be placed because:  You need IV medicine that would be irritating to the small veins in your hands or arms.  You need long-term IV medicines, such as antibiotics.  You need IV nutrition for a long period.  You need frequent blood draws for lab tests.  You need dialysis.  Implanted ports are usually placed in the chest area, but they can also be placed in the upper arm, the abdomen, or the leg. An implanted port has two main parts:  Reservoir. The reservoir is round and will appear as a small, raised area under your skin. The reservoir is the part where a needle is inserted to give medicines or draw blood.  Catheter. The catheter is a thin, flexible tube that extends from the reservoir. The catheter is placed into a large vein. Medicine that is inserted into the reservoir goes into the catheter and then into the vein.  How will I care for my incision site? Do not get the incision site wet. Bathe or shower as directed by your health care provider. How is my port accessed? Special steps must be taken to access the port:  Before the port is accessed, a numbing cream can be placed on the skin. This helps numb the skin over the port site.  Your health care provider uses a sterile technique to access the port. ? Your health care provider must put on a mask and sterile gloves. ? The skin over your port is cleaned carefully with an antiseptic and allowed to dry. ? The port is gently pinched between sterile gloves, and a needle is inserted into the port.  Only "non-coring" port needles should be used to access the port. Once the port is accessed, a blood return should be checked. This helps ensure that the port  is in the vein and is not clogged.  If your port needs to remain accessed for a constant infusion, a clear (transparent) bandage will be placed over the needle site. The bandage and needle will need to be changed every week, or as directed by your health care provider.  Keep the bandage covering the needle clean and dry. Do not get it wet. Follow your health care provider's instructions on how to take a shower or bath while the port is accessed.  If your port does not need to stay accessed, no bandage is needed over the port.  What is flushing? Flushing helps keep the port from getting clogged. Follow your health care provider's instructions on how and when to flush the port. Ports are usually flushed with saline solution or a medicine called heparin. The need for flushing will depend on how the port is used.  If the port is used for intermittent medicines or blood draws, the port will need to be flushed: ? After medicines have been given. ? After blood has been drawn. ? As part of routine maintenance.  If a constant infusion is running, the port may not need to be flushed.  How long will my port stay implanted? The port can stay in for as long as your health care provider thinks it is needed. When it is time for the port to come out, surgery will be   done to remove it. The procedure is similar to the one performed when the port was put in. When should I seek immediate medical care? When you have an implanted port, you should seek immediate medical care if:  You notice a bad smell coming from the incision site.  You have swelling, redness, or drainage at the incision site.  You have more swelling or pain at the port site or the surrounding area.  You have a fever that is not controlled with medicine.  This information is not intended to replace advice given to you by your health care provider. Make sure you discuss any questions you have with your health care provider. Document  Released: 05/29/2005 Document Revised: 11/04/2015 Document Reviewed: 02/03/2013 Elsevier Interactive Patient Education  2017 Elsevier Inc.  

## 2017-02-17 ENCOUNTER — Ambulatory Visit (HOSPITAL_BASED_OUTPATIENT_CLINIC_OR_DEPARTMENT_OTHER): Payer: Self-pay

## 2017-02-17 VITALS — BP 118/73 | HR 86 | Temp 98.0°F | Resp 18

## 2017-02-17 DIAGNOSIS — C20 Malignant neoplasm of rectum: Secondary | ICD-10-CM

## 2017-02-17 MED ORDER — HEPARIN SOD (PORK) LOCK FLUSH 100 UNIT/ML IV SOLN
500.0000 [IU] | Freq: Once | INTRAVENOUS | Status: AC | PRN
  Administered 2017-02-17: 500 [IU]
  Filled 2017-02-17: qty 5

## 2017-02-17 MED ORDER — SODIUM CHLORIDE 0.9% FLUSH
10.0000 mL | INTRAVENOUS | Status: DC | PRN
Start: 2017-02-17 — End: 2017-02-17
  Administered 2017-02-17: 10 mL
  Filled 2017-02-17: qty 10

## 2017-02-19 ENCOUNTER — Ambulatory Visit: Payer: Federal, State, Local not specified - PPO | Admitting: Nurse Practitioner

## 2017-02-19 ENCOUNTER — Ambulatory Visit: Payer: Federal, State, Local not specified - PPO

## 2017-02-19 ENCOUNTER — Other Ambulatory Visit: Payer: Federal, State, Local not specified - PPO

## 2017-02-20 ENCOUNTER — Telehealth: Payer: Self-pay | Admitting: *Deleted

## 2017-02-20 ENCOUNTER — Telehealth: Payer: Self-pay

## 2017-02-20 NOTE — Telephone Encounter (Signed)
Voicemail: "I have a P.I.C.C. Line.  The Home Health nurse has said I am to flush PICC line daily with heparin.  Should saline and heparin be used?  I do not know what to do.  I need clarification for what is the proper procedure.  Please call [phone]." With return call to patient asked about "Bio Patch protocol.  On Saturday (02-17-2017), [facility] staff instructed me to make sure home health applies Bio patch.  Home health does not offer these.  My dressing is changed once a week since hospital discharge."    This nurse provided P.I.C.C. Device protocol for dressing change weekly with flush daily of lumens not in use.  [facility] outpatient protocol includes dressing change weekly with flush Monday, Wednesday, Friday for patient travel convenience.  Daily is optimal.  Flush with saline 10 ml followed by heparin 250 units.  Bio Patch is [employer], not home health policy.  The patch is used to fight bacteria.  Check dressing daily to ensure occlusive dressing remains intact with no rips or tears and sides sealed, risk of bacteria is decreased. Call home health or [place], a new dressing will be required if weekly dressing has failed or come apart. Denies any further questions.

## 2017-02-20 NOTE — Telephone Encounter (Signed)
LVM with patient confirming the frequency he needs to flush his PICC at home.

## 2017-02-26 ENCOUNTER — Encounter (HOSPITAL_COMMUNITY): Payer: Self-pay

## 2017-02-26 ENCOUNTER — Ambulatory Visit: Payer: Federal, State, Local not specified - PPO

## 2017-02-26 ENCOUNTER — Ambulatory Visit: Payer: Federal, State, Local not specified - PPO | Admitting: Nurse Practitioner

## 2017-02-26 ENCOUNTER — Other Ambulatory Visit: Payer: Federal, State, Local not specified - PPO

## 2017-02-26 ENCOUNTER — Ambulatory Visit (HOSPITAL_COMMUNITY)
Admission: RE | Admit: 2017-02-26 | Discharge: 2017-02-26 | Disposition: A | Discharge status: Home / Self Care | Payer: Federal, State, Local not specified - PPO | Source: Ambulatory Visit | Attending: Nurse Practitioner | Admitting: Nurse Practitioner

## 2017-02-26 DIAGNOSIS — C7951 Secondary malignant neoplasm of bone: Secondary | ICD-10-CM | POA: Insufficient documentation

## 2017-02-26 DIAGNOSIS — I700 Atherosclerosis of aorta: Secondary | ICD-10-CM | POA: Insufficient documentation

## 2017-02-26 DIAGNOSIS — C20 Malignant neoplasm of rectum: Secondary | ICD-10-CM | POA: Insufficient documentation

## 2017-02-26 MED ORDER — IOPAMIDOL (ISOVUE-300) INJECTION 61%
100.0000 mL | Freq: Once | INTRAVENOUS | Status: AC | PRN
  Administered 2017-02-26: 100 mL via INTRAVENOUS

## 2017-02-26 MED ORDER — HEPARIN SOD (PORK) LOCK FLUSH 100 UNIT/ML IV SOLN
500.0000 [IU] | Freq: Once | INTRAVENOUS | Status: AC
  Administered 2017-02-26: 250 [IU] via INTRAVENOUS

## 2017-02-26 MED ORDER — HEPARIN SOD (PORK) LOCK FLUSH 100 UNIT/ML IV SOLN
INTRAVENOUS | Status: AC
  Filled 2017-02-26: qty 5

## 2017-02-26 MED ORDER — IOPAMIDOL (ISOVUE-300) INJECTION 61%
INTRAVENOUS | Status: AC
  Administered 2017-02-26: 100 mL via INTRAVENOUS
  Filled 2017-02-26: qty 100

## 2017-02-27 ENCOUNTER — Telehealth: Payer: Self-pay | Admitting: *Deleted

## 2017-02-27 NOTE — Telephone Encounter (Signed)
Call from [place], [facility] RN requesting to extend orders for PICC care. Reviewed with Dr. Benay Spice, verbal order given. They will decrease frequency to every other week since pt is coming to [facility] every other week. Nurse reports pt wet PICC dressing in the shower. RN changed dressing, site appears mildly irritated but does not appear infected. She has reinforced teaching, wanted to make MD aware.

## 2017-03-01 ENCOUNTER — Ambulatory Visit (HOSPITAL_BASED_OUTPATIENT_CLINIC_OR_DEPARTMENT_OTHER): Payer: Federal, State, Local not specified - PPO | Admitting: Oncology

## 2017-03-01 ENCOUNTER — Ambulatory Visit (HOSPITAL_BASED_OUTPATIENT_CLINIC_OR_DEPARTMENT_OTHER): Payer: Federal, State, Local not specified - PPO

## 2017-03-01 ENCOUNTER — Other Ambulatory Visit (HOSPITAL_BASED_OUTPATIENT_CLINIC_OR_DEPARTMENT_OTHER): Payer: Federal, State, Local not specified - PPO

## 2017-03-01 ENCOUNTER — Telehealth: Payer: Self-pay | Admitting: Oncology

## 2017-03-01 VITALS — BP 127/78 | HR 67 | Temp 97.7°F | Resp 20 | Ht 75.0 in | Wt 206.2 lb

## 2017-03-01 DIAGNOSIS — C20 Malignant neoplasm of rectum: Secondary | ICD-10-CM

## 2017-03-01 DIAGNOSIS — C674 Malignant neoplasm of posterior wall of bladder: Secondary | ICD-10-CM

## 2017-03-01 DIAGNOSIS — C770 Secondary and unspecified malignant neoplasm of lymph nodes of head, face and neck: Secondary | ICD-10-CM

## 2017-03-01 DIAGNOSIS — Z5111 Encounter for antineoplastic chemotherapy: Secondary | ICD-10-CM

## 2017-03-01 DIAGNOSIS — D701 Agranulocytosis secondary to cancer chemotherapy: Secondary | ICD-10-CM | POA: Diagnosis not present

## 2017-03-01 DIAGNOSIS — Z23 Encounter for immunization: Secondary | ICD-10-CM

## 2017-03-01 DIAGNOSIS — D6959 Other secondary thrombocytopenia: Secondary | ICD-10-CM | POA: Diagnosis not present

## 2017-03-01 LAB — CBC WITH DIFFERENTIAL/PLATELET
BASO%: 1.5 % (ref 0.0–2.0)
BASOS ABS: 0.1 10*3/uL (ref 0.0–0.1)
EOS%: 12.1 % — ABNORMAL HIGH (ref 0.0–7.0)
Eosinophils Absolute: 0.4 10*3/uL (ref 0.0–0.5)
HCT: 38.2 % — ABNORMAL LOW (ref 38.4–49.9)
HEMOGLOBIN: 12.9 g/dL — AB (ref 13.0–17.1)
LYMPH#: 1.2 10*3/uL (ref 0.9–3.3)
LYMPH%: 34.5 % (ref 14.0–49.0)
MCH: 31.8 pg (ref 27.2–33.4)
MCHC: 33.8 g/dL (ref 32.0–36.0)
MCV: 94.1 fL (ref 79.3–98.0)
MONO#: 0.5 10*3/uL (ref 0.1–0.9)
MONO%: 13.6 % (ref 0.0–14.0)
NEUT%: 38.3 % — ABNORMAL LOW (ref 39.0–75.0)
NEUTROS ABS: 1.3 10*3/uL — AB (ref 1.5–6.5)
PLATELETS: 90 10*3/uL — AB (ref 140–400)
RBC: 4.06 10*6/uL — AB (ref 4.20–5.82)
RDW: 16.3 % — ABNORMAL HIGH (ref 11.0–14.6)
WBC: 3.4 10*3/uL — ABNORMAL LOW (ref 4.0–10.3)

## 2017-03-01 LAB — COMPREHENSIVE METABOLIC PANEL
ALBUMIN: 3.4 g/dL — AB (ref 3.5–5.0)
ALK PHOS: 80 U/L (ref 40–150)
ALT: 18 U/L (ref 0–55)
ANION GAP: 5 meq/L (ref 3–11)
AST: 32 U/L (ref 5–34)
BILIRUBIN TOTAL: 0.46 mg/dL (ref 0.20–1.20)
BUN: 11.3 mg/dL (ref 7.0–26.0)
CO2: 25 mEq/L (ref 22–29)
Calcium: 10.4 mg/dL (ref 8.4–10.4)
Chloride: 109 mEq/L (ref 98–109)
Creatinine: 1 mg/dL (ref 0.7–1.3)
EGFR: 79 mL/min/{1.73_m2} — AB (ref >90)
Glucose: 102 mg/dl (ref 70–140)
Potassium: 4.3 mEq/L (ref 3.5–5.1)
Sodium: 139 mEq/L (ref 136–145)
TOTAL PROTEIN: 6.4 g/dL (ref 6.4–8.3)

## 2017-03-01 LAB — CEA (IN HOUSE-CHCC): CEA (CHCC-In House): 15.63 ng/mL — ABNORMAL HIGH (ref 0.00–5.00)

## 2017-03-01 MED ORDER — INFLUENZA VAC SPLIT QUAD 0.5 ML IM SUSY
0.5000 mL | PREFILLED_SYRINGE | Freq: Once | INTRAMUSCULAR | Status: AC
  Administered 2017-03-01: 0.5 mL via INTRAMUSCULAR
  Filled 2017-03-01: qty 0.5

## 2017-03-01 MED ORDER — PALONOSETRON HCL INJECTION 0.25 MG/5ML
0.2500 mg | Freq: Once | INTRAVENOUS | Status: AC
  Administered 2017-03-01: 0.25 mg via INTRAVENOUS

## 2017-03-01 MED ORDER — PALONOSETRON HCL INJECTION 0.25 MG/5ML
INTRAVENOUS | Status: AC
  Filled 2017-03-01: qty 5

## 2017-03-01 MED ORDER — DEXTROSE 5 % IV SOLN
Freq: Once | INTRAVENOUS | Status: AC
  Administered 2017-03-01: 11:00:00 via INTRAVENOUS

## 2017-03-01 MED ORDER — DEXAMETHASONE SODIUM PHOSPHATE 10 MG/ML IJ SOLN
INTRAMUSCULAR | Status: AC
  Filled 2017-03-01: qty 1

## 2017-03-01 MED ORDER — LEUCOVORIN CALCIUM INJECTION 350 MG
400.0000 mg/m2 | Freq: Once | INTRAVENOUS | Status: AC
  Administered 2017-03-01: 892 mg via INTRAVENOUS
  Filled 2017-03-01: qty 44.6

## 2017-03-01 MED ORDER — DEXAMETHASONE SODIUM PHOSPHATE 10 MG/ML IJ SOLN
10.0000 mg | Freq: Once | INTRAMUSCULAR | Status: AC
  Administered 2017-03-01: 10 mg via INTRAVENOUS

## 2017-03-01 MED ORDER — SODIUM CHLORIDE 0.9 % IV SOLN
2400.0000 mg/m2 | INTRAVENOUS | Status: DC
  Administered 2017-03-01: 5350 mg via INTRAVENOUS
  Filled 2017-03-01: qty 107

## 2017-03-01 MED ORDER — FLUOROURACIL CHEMO INJECTION 2.5 GM/50ML
400.0000 mg/m2 | Freq: Once | INTRAVENOUS | Status: AC
  Administered 2017-03-01: 900 mg via INTRAVENOUS
  Filled 2017-03-01: qty 18

## 2017-03-01 MED ORDER — DEXTROSE 5 % IV SOLN
55.0000 mg/m2 | Freq: Once | INTRAVENOUS | Status: AC
  Administered 2017-03-01: 125 mg via INTRAVENOUS
  Filled 2017-03-01: qty 20

## 2017-03-01 NOTE — Patient Instructions (Signed)
[place] Discharge Instructions for Patients Receiving Chemotherapy  Today you received the following chemotherapy agents Oxaliplatin, Leucovorin, 5FU  To help prevent nausea and vomiting after your treatment, we encourage you to take your nausea medication as directed.    If you develop nausea and vomiting that is not controlled by your nausea medication, call the clinic.   BELOW ARE SYMPTOMS THAT SHOULD BE REPORTED IMMEDIATELY:  *FEVER GREATER THAN 100.5 F  *CHILLS WITH OR WITHOUT FEVER  NAUSEA AND VOMITING THAT IS NOT CONTROLLED WITH YOUR NAUSEA MEDICATION  *UNUSUAL SHORTNESS OF BREATH  *UNUSUAL BRUISING OR BLEEDING  TENDERNESS IN MOUTH AND THROAT WITH OR WITHOUT PRESENCE OF ULCERS  *URINARY PROBLEMS  *BOWEL PROBLEMS  UNUSUAL RASH Items with * indicate a potential emergency and should be followed up as soon as possible.  Feel free to call the clinic you have any questions or concerns. The clinic phone number is [phone].  Please show the [place] at check-in to the Emergency Department and triage nurse.

## 2017-03-01 NOTE — Telephone Encounter (Signed)
Scheduled appt per 9/20 los -patient to get an updated schedule in the treatment area.

## 2017-03-01 NOTE — Progress Notes (Signed)
[employer] OFFICE PROGRESS NOTE   Diagnosis: Rectal cancer  INTERVAL HISTORY:   Mr. Beltran returns as scheduled. He completed another cycle of FOLFOX 02/15/2017. No cold sensitivity or peripheral numbness. He noted a "burning "sensation in the mouth without discrete ulcers. No nausea or diarrhea. He completed the course of antibiotics for the staph bacteremia. No problem with the left PICC  Objective:  Vital signs in last 24 hours:  Blood pressure 127/78, pulse 67, temperature 97.7 F (36.5 C), temperature source Oral, resp. rate 20, height _0  (1.905 m), weight 206 lb 3.2 oz (93.5 kg), SpO2 99 %.    HEENT: No thrush or ulcers Lymphatics: No cervical or supraclavicular nodes Resp: Lungs clear bilaterally Cardio: Regular rate and rhythm GI: No hepatosplenomegaly, no mass, nontender Vascular: Trace edema at the right lower leg Neuro: Mild to moderate loss of vibratory sense at the fingertips bilaterally  Skin: No rash   Portacath/PICC-without erythema  Lab Results:  Lab Results  Component Value Date   WBC 3.4 (L) 03/01/2017   HGB 12.9 (L) 03/01/2017   HCT 38.2 (L) 03/01/2017   MCV 94.1 03/01/2017   PLT 90 (L) 03/01/2017   NEUTROABS 1.3 (L) 03/01/2017    CMP     Component Value Date/Time   NA 139 03/01/2017 0923   K 4.3 03/01/2017 0923   CL 108 01/29/2017 0835   CO2 25 03/01/2017 0923   GLUCOSE 102 03/01/2017 0923   BUN 11.3 03/01/2017 0923   CREATININE 1.0 03/01/2017 0923   CALCIUM 10.4 03/01/2017 0923   PROT 6.4 03/01/2017 0923   ALBUMIN 3.4 (L) 03/01/2017 0923   AST 32 03/01/2017 0923   ALT 18 03/01/2017 0923   ALKPHOS 80 03/01/2017 0923   BILITOT 0.46 03/01/2017 0923   GFRNONAA >60 01/29/2017 0835   GFRAA >60 01/29/2017 0835    Lab Results  Component Value Date   CEA1 11.29 (H) 02/15/2017     Imaging:  Ct Chest W Contrast  Result Date: 02/26/2017 CLINICAL DATA:  Rectal cancer, chemotherapy in progress. EXAM: CT CHEST,  ABDOMEN, AND PELVIS WITH CONTRAST TECHNIQUE: Multidetector CT imaging of the chest, abdomen and pelvis was performed following the standard protocol during bolus administration of intravenous contrast. CONTRAST:  100 cc Isovue-300. COMPARISON:  PET 12/01/2016 and CT abdomen pelvis 10/20/2016. FINDINGS: CT CHEST FINDINGS Cardiovascular: Left PICC terminates in the SVC. Mild atherosclerotic calcification of the arterial vasculature, including coronary arteries. Heart size normal. No pericardial effusion. Mediastinum/Nodes: No pathologically enlarged mediastinal, hilar or axillary lymph nodes. Esophagus is grossly unremarkable. Lungs/Pleura: Lungs are clear. No pleural fluid. Airway is unremarkable. Musculoskeletal: There are scattered round sclerotic lesions in the spine and sternum, new from 12/01/2016. CT ABDOMEN PELVIS FINDINGS Hepatobiliary: Liver and gallbladder are unremarkable. No biliary ductal dilatation. Pancreas: Negative. Spleen: Negative. Adrenals/Urinary Tract: Adrenal glands are unremarkable. Mild pelvicaliceal fullness in the right kidney. 2.0 cm fluid density lesion in the interpolar left kidney is unchanged and likely a cyst. Ureters are otherwise decompressed. Bladder wall appears somewhat thickened. Stomach/Bowel: Stomach, small bowel and appendix are unremarkable. Stool is seen throughout the colon, indicative of constipation. Residual rectal wall thickening, possibly minimally improved from 12/01/2016 but also slightly better distended on the current study. Vascular/Lymphatic: Atherosclerotic calcification of the arterial vasculature without abdominal aortic aneurysm. Stranding throughout the abdominal and pelvic retroperitoneal fat without residual associated adenopathy. No perirectal adenopathy. Reproductive: Prostate is visualized. Other: Mild presacral edema, as before. Soft tissue thickening is again seen at the  umbilicus and is most likely related to umbilical hernia repair. Mesenteries and  peritoneum are otherwise unremarkable. Musculoskeletal: New sclerotic lesions are seen in the spine and posterior right iliac wing. IMPRESSION: 1. New osseous metastatic disease. 2. Residual rectal wall thickening and abdominopelvic retroperitoneal stranding. No residual adenopathy. 3. Aortic atherosclerosis (ICD10-170.0). Electronically Signed   By: Lorin Picket M.D.   On: 02/26/2017 08:40   Ct Abdomen Pelvis W Contrast  Result Date: 02/26/2017 CLINICAL DATA:  Rectal cancer, chemotherapy in progress. EXAM: CT CHEST, ABDOMEN, AND PELVIS WITH CONTRAST TECHNIQUE: Multidetector CT imaging of the chest, abdomen and pelvis was performed following the standard protocol during bolus administration of intravenous contrast. CONTRAST:  100 cc Isovue-300. COMPARISON:  PET 12/01/2016 and CT abdomen pelvis 10/20/2016. FINDINGS: CT CHEST FINDINGS Cardiovascular: Left PICC terminates in the SVC. Mild atherosclerotic calcification of the arterial vasculature, including coronary arteries. Heart size normal. No pericardial effusion. Mediastinum/Nodes: No pathologically enlarged mediastinal, hilar or axillary lymph nodes. Esophagus is grossly unremarkable. Lungs/Pleura: Lungs are clear. No pleural fluid. Airway is unremarkable. Musculoskeletal: There are scattered round sclerotic lesions in the spine and sternum, new from 12/01/2016. CT ABDOMEN PELVIS FINDINGS Hepatobiliary: Liver and gallbladder are unremarkable. No biliary ductal dilatation. Pancreas: Negative. Spleen: Negative. Adrenals/Urinary Tract: Adrenal glands are unremarkable. Mild pelvicaliceal fullness in the right kidney. 2.0 cm fluid density lesion in the interpolar left kidney is unchanged and likely a cyst. Ureters are otherwise decompressed. Bladder wall appears somewhat thickened. Stomach/Bowel: Stomach, small bowel and appendix are unremarkable. Stool is seen throughout the colon, indicative of constipation. Residual rectal wall thickening, possibly minimally  improved from 12/01/2016 but also slightly better distended on the current study. Vascular/Lymphatic: Atherosclerotic calcification of the arterial vasculature without abdominal aortic aneurysm. Stranding throughout the abdominal and pelvic retroperitoneal fat without residual associated adenopathy. No perirectal adenopathy. Reproductive: Prostate is visualized. Other: Mild presacral edema, as before. Soft tissue thickening is again seen at the umbilicus and is most likely related to umbilical hernia repair. Mesenteries and peritoneum are otherwise unremarkable. Musculoskeletal: New sclerotic lesions are seen in the spine and posterior right iliac wing. IMPRESSION: 1. New osseous metastatic disease. 2. Residual rectal wall thickening and abdominopelvic retroperitoneal stranding. No residual adenopathy. 3. Aortic atherosclerosis (ICD10-170.0). Electronically Signed   By: Lorin Picket M.D.   On: 02/26/2017 08:40    Medications: I have reviewed the patient's current medications.  Assessment/Plan: 1. Rectal cancer  MRI lumbar spine 10/20/2016 with bilateral hydronephrosis and retroperitoneal adenopathy  CT abdomen/pelvis 10/20/2016 with extensive retroperitoneal process with marked interstitial thickening and lymphadenopathy; bilateral hydronephrosis; similar ill-defined soft tissue density and skin thickening around the umbilicus; diffuse bladder wall thickening slightly asymmetric at the bladder dome but no obvious bladder mass  Biopsy para-aortic lymph node 11/10/2016-metastatic adenocarcinoma consistent with GI primary  11/23/2016 CEA elevated, CA-19-9 normal  Colonoscopy 11/27/2016-fungating infiltrative nonobstructing mass in the distal rectum involving the anal verge. The rectum was fixed and frozen in the pelvis precluding passage of scope beyond the distal sigmoid colon. 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  Foundation 1-microsatellite stable; tumor mutational burden 5;  NOTCH3amplification; NOTCH3rearrangement exon 14  Upper endoscopy 11/27/2016-normal.  PET scan 06/22/2018showed minimal activity in the primary rectal carcinoma, diffuse wall thickening; mild activity associate with retroperitoneal lymph nodes; persistent haziness within the retroperitoneum. No evidence of liver metastasis or distant metastasis.  Cycle 1 FOLFOX 12/06/2016  Posterior bladder wall biopsy 12/11/2016-metastatic carcinoma, consistent with metastatic signet ring cell carcinoma  Biopsy left supraclavicular adenopathy 12/14/2016-metastatic signet  ring cell adenocarcinoma  Cycle 2 FOLFOX 12/20/2016  Cycle 3 FOLFOX 01/03/2017 (oxaliplatin dose reduced secondary to thrombocytopenia)   Cycle 4 FOLFOX 01/17/2017 (oxaliplatin held due to thrombocytopenia)  Cycle 5 FOLFOX 02/15/2017  CTs 02/26/2017-new sclerotic lesions in the spine and right iliac, no residual adenopathy, persistent rectal wall thickening  Cycle 6 FOLFOX 03/01/2017 (Neulasta added, oxaliplatin further dose reduced) today's 2. Retroperitoneal lymphadenopathy secondary to #1 3. Bilateral hydronephrosis status post evaluation by urology 4. Asymmetry of the bladder dome on CT 10/20/2016 status post cystoscopy with biopsy planned 12/11/2016 (Dr. 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 Change in bowel habits, secondary to #1-improved. 6. Right leg edema, question due to lymphatic obstruction;venous Doppler negative for DVT 11/23/2016. 7. Port-A-Cath placement 12/05/2016 8. Thrombocytopenia secondary to chemotherapy; oxaliplatin dose reduced with cycle 3 FOLFOX. Progressive thrombocytopenia 01/17/2017; oxaliplatin held 01/17/2017. 9. Right lower extremity cellulitis 01/27/2017-blood culture positive for methicillin sensitive staph aureus; Port-A-Cath removed. PICC line placed. TEE 01/30/2017 without evidence of vegetation. 14  days of Ancef recommended after Port-A-Cath removal with end date 02/14/2017. 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 Kutzer appears well. He will complete cycle 6 FOLFOX today. There has been clinical, CEA, and radiologic improvement in the metastatic rectal cancer. I suspect the new bone lesions are related to treated metastases. I reviewed the CT images with Mr. Robideau today.  He has mild neutropenia and thrombocytopenia. We will dose reduce the oxaliplatin and add Neulasta. He will contact us for a fever or bleeding.  Mr. Carver will receive an influenza vaccine today.  He will return for an office visit and chemotherapy in 2 weeks. We will schedule placement of a Port-A-Cath after the next cycle of chemotherapy.  25 minutes were spent with the patient today. The majority of the time was used for counseling and coordination of care.  Donneta Romberg, MD  03/01/2017  10:08 AM

## 2017-03-01 NOTE — Progress Notes (Signed)
Per Dr. Benay Spice: OK to treat with ANC 1.3 and PLT 90. Pt will receive Neulasta this cycle.

## 2017-03-02 ENCOUNTER — Telehealth: Payer: Self-pay | Admitting: Oncology

## 2017-03-02 NOTE — Telephone Encounter (Signed)
Added injection appt per 9/20 sch msg. Spoke with patient regarding this appt.

## 2017-03-03 ENCOUNTER — Ambulatory Visit (HOSPITAL_BASED_OUTPATIENT_CLINIC_OR_DEPARTMENT_OTHER): Payer: Federal, State, Local not specified - PPO

## 2017-03-03 ENCOUNTER — Ambulatory Visit: Payer: Federal, State, Local not specified - PPO

## 2017-03-03 VITALS — BP 126/77 | HR 90 | Temp 97.6°F | Resp 18

## 2017-03-03 DIAGNOSIS — D701 Agranulocytosis secondary to cancer chemotherapy: Secondary | ICD-10-CM | POA: Diagnosis not present

## 2017-03-03 DIAGNOSIS — C20 Malignant neoplasm of rectum: Secondary | ICD-10-CM | POA: Diagnosis not present

## 2017-03-03 MED ORDER — PEGFILGRASTIM INJECTION 6 MG/0.6ML ~~LOC~~
6.0000 mg | PREFILLED_SYRINGE | Freq: Once | SUBCUTANEOUS | Status: AC
  Administered 2017-03-03: 6 mg via SUBCUTANEOUS

## 2017-03-03 MED ORDER — HEPARIN SOD (PORK) LOCK FLUSH 100 UNIT/ML IV SOLN
500.0000 [IU] | Freq: Once | INTRAVENOUS | Status: AC | PRN
  Administered 2017-03-03: 250 [IU]
  Filled 2017-03-03: qty 5

## 2017-03-03 MED ORDER — SODIUM CHLORIDE 0.9% FLUSH
10.0000 mL | INTRAVENOUS | Status: DC | PRN
  Administered 2017-03-03: 10 mL
  Filled 2017-03-03: qty 10

## 2017-03-03 NOTE — Patient Instructions (Addendum)
Implanted Port Home Guide An implanted port is a type of central line that is placed under the skin. Central lines are used to provide IV access when treatment or nutrition needs to be given through a person's veins. Implanted ports are used for long-term IV access. An implanted port may be placed because:  You need IV medicine that would be irritating to the small veins in your hands or arms.  You need long-term IV medicines, such as antibiotics.  You need IV nutrition for a long period.  You need frequent blood draws for lab tests.  You need dialysis. Implanted ports are usually placed in the chest area, but they can also be placed in the upper arm, the abdomen, or the leg. An implanted port has two main parts:  Reservoir. The reservoir is round and will appear as a small, raised area under your skin. The reservoir is the part where a needle is inserted to give medicines or draw blood.  Catheter. The catheter is a thin, flexible tube that extends from the reservoir. The catheter is placed into a large vein. Medicine that is inserted into the reservoir goes into the catheter and then into the vein. How will I care for my incision site? Do not get the incision site wet. Bathe or shower as directed by your health care provider. How is my port accessed? Special steps must be taken to access the port:  Before the port is accessed, a numbing cream can be placed on the skin. This helps numb the skin over the port site.  Your health care provider uses a sterile technique to access the port.  Your health care provider must put on a mask and sterile gloves.  The skin over your port is cleaned carefully with an antiseptic and allowed to dry.  The port is gently pinched between sterile gloves, and a needle is inserted into the port.  Only "non-coring" port needles should be used to access the port. Once the port is accessed, a blood return should be checked. This helps ensure that the port is  in the vein and is not clogged.  If your port needs to remain accessed for a constant infusion, a clear (transparent) bandage will be placed over the needle site. The bandage and needle will need to be changed every week, or as directed by your health care provider.  Keep the bandage covering the needle clean and dry. Do not get it wet. Follow your health care provider's instructions on how to take a shower or bath while the port is accessed.  If your port does not need to stay accessed, no bandage is needed over the port. What is flushing? Flushing helps keep the port from getting clogged. Follow your health care provider's instructions on how and when to flush the port. Ports are usually flushed with saline solution or a medicine called heparin. The need for flushing will depend on how the port is used.  If the port is used for intermittent medicines or blood draws, the port will need to be flushed:  After medicines have been given.  After blood has been drawn.  As part of routine maintenance.  If a constant infusion is running, the port may not need to be flushed. How long will my port stay implanted? The port can stay in for as long as your health care provider thinks it is needed. When it is time for the port to come out, surgery will be done to remove it.   The procedure is similar to the one performed when the port was put in. When should I seek immediate medical care? When you have an implanted port, you should seek immediate medical care if:  You notice a bad smell coming from the incision site.  You have swelling, redness, or drainage at the incision site.  You have more swelling or pain at the port site or the surrounding area.  You have a fever that is not controlled with medicine. This information is not intended to replace advice given to you by your health care provider. Make sure you discuss any questions you have with your health care provider. Document Released:  05/29/2005 Document Revised: 11/04/2015 Document Reviewed: 02/03/2013 Elsevier Interactive Patient Education  2017 [employer] Pegfilgrastim injection What is this medicine? PEGFILGRASTIM (PEG fil gra stim) is a long-acting granulocyte colony-stimulating factor that stimulates the growth of neutrophils, a type of white blood cell important in the body's fight against infection. It is used to reduce the incidence of fever and infection in patients with certain types of cancer who are receiving chemotherapy that affects the bone marrow, and to increase survival after being exposed to high doses of radiation. This medicine may be used for other purposes; ask your health care provider or pharmacist if you have questions. COMMON BRAND NAME(S): Neulasta What should I tell my health care provider before I take this medicine? They need to know if you have any of these conditions: -kidney disease -latex allergy -ongoing radiation therapy -sickle cell disease -skin reactions to acrylic adhesives (On-Body Injector only) -an unusual or allergic reaction to pegfilgrastim, filgrastim, other medicines, foods, dyes, or preservatives -pregnant or trying to get pregnant -breast-feeding How should I use this medicine? This medicine is for injection under the skin. If you get this medicine at home, you will be taught how to prepare and give the pre-filled syringe or how to use the On-body Injector. Refer to the patient Instructions for Use for detailed instructions. Use exactly as directed. Tell your healthcare provider immediately if you suspect that the On-body Injector may not have performed as intended or if you suspect the use of the On-body Injector resulted in a missed or partial dose. It is important that you put your used needles and syringes in a special sharps container. Do not put them in a trash can. If you do not have a sharps container, call your pharmacist or healthcare provider to get one. Talk  to your pediatrician regarding the use of this medicine in children. While this drug may be prescribed for selected conditions, precautions do apply. Overdosage: If you think you have taken too much of this medicine contact a poison control center or emergency room at once. NOTE: This medicine is only for you. Do not share this medicine with others. What if I miss a dose? It is important not to miss your dose. Call your doctor or health care professional if you miss your dose. If you miss a dose due to an On-body Injector failure or leakage, a new dose should be administered as soon as possible using a single prefilled syringe for manual use. What may interact with this medicine? Interactions have not been studied. Give your health care provider a list of all the medicines, herbs, non-prescription drugs, or dietary supplements you use. Also tell them if you smoke, drink alcohol, or use illegal drugs. Some items may interact with your medicine. This list may not describe all possible interactions. Give your health care provider   a list of all the medicines, herbs, non-prescription drugs, or dietary supplements you use. Also tell them if you smoke, drink alcohol, or use illegal drugs. Some items may interact with your medicine. What should I watch for while using this medicine? You may need blood work done while you are taking this medicine. If you are going to need a MRI, CT scan, or other procedure, tell your doctor that you are using this medicine (On-Body Injector only). What side effects may I notice from receiving this medicine? Side effects that you should report to your doctor or health care professional as soon as possible: -allergic reactions like skin rash, itching or hives, swelling of the face, lips, or tongue -dizziness -fever -pain, redness, or irritation at site where injected -pinpoint red spots on the skin -red or dark-brown urine -shortness of breath or breathing  problems -stomach or side pain, or pain at the shoulder -swelling -tiredness -trouble passing urine or change in the amount of urine Side effects that usually do not require medical attention (report to your doctor or health care professional if they continue or are bothersome): -bone pain -muscle pain This list may not describe all possible side effects. Call your doctor for medical advice about side effects. You may report side effects to FDA at 1-800-FDA-1088. Where should I keep my medicine? Keep out of the reach of children. Store pre-filled syringes in a refrigerator between 2 and 8 degrees C (36 and 46 degrees F). Do not freeze. Keep in carton to protect from light. Throw away this medicine if it is left out of the refrigerator for more than 48 hours. Throw away any unused medicine after the expiration date. NOTE: This sheet is a summary. It may not cover all possible information. If you have questions about this medicine, talk to your doctor, pharmacist, or health care provider.  2018 Elsevier/Gold Standard (2016-05-25 12:58:03)  

## 2017-03-05 ENCOUNTER — Other Ambulatory Visit: Payer: Self-pay | Admitting: Oncology

## 2017-03-15 ENCOUNTER — Ambulatory Visit: Payer: Federal, State, Local not specified - PPO

## 2017-03-15 ENCOUNTER — Other Ambulatory Visit (HOSPITAL_BASED_OUTPATIENT_CLINIC_OR_DEPARTMENT_OTHER): Payer: Federal, State, Local not specified - PPO

## 2017-03-15 ENCOUNTER — Ambulatory Visit (HOSPITAL_BASED_OUTPATIENT_CLINIC_OR_DEPARTMENT_OTHER): Payer: Federal, State, Local not specified - PPO | Admitting: Oncology

## 2017-03-15 ENCOUNTER — Ambulatory Visit (HOSPITAL_BASED_OUTPATIENT_CLINIC_OR_DEPARTMENT_OTHER): Payer: Federal, State, Local not specified - PPO

## 2017-03-15 VITALS — BP 115/63 | HR 63 | Temp 97.8°F | Resp 17 | Ht 75.0 in | Wt 206.5 lb

## 2017-03-15 DIAGNOSIS — M7989 Other specified soft tissue disorders: Secondary | ICD-10-CM | POA: Diagnosis not present

## 2017-03-15 DIAGNOSIS — C770 Secondary and unspecified malignant neoplasm of lymph nodes of head, face and neck: Secondary | ICD-10-CM

## 2017-03-15 DIAGNOSIS — C20 Malignant neoplasm of rectum: Secondary | ICD-10-CM | POA: Diagnosis not present

## 2017-03-15 DIAGNOSIS — C674 Malignant neoplasm of posterior wall of bladder: Secondary | ICD-10-CM

## 2017-03-15 DIAGNOSIS — Z5111 Encounter for antineoplastic chemotherapy: Secondary | ICD-10-CM

## 2017-03-15 DIAGNOSIS — D6959 Other secondary thrombocytopenia: Secondary | ICD-10-CM | POA: Diagnosis not present

## 2017-03-15 DIAGNOSIS — G620 Drug-induced polyneuropathy: Secondary | ICD-10-CM

## 2017-03-15 LAB — CBC WITH DIFFERENTIAL/PLATELET
BASO%: 0.3 % (ref 0.0–2.0)
BASOS ABS: 0 10*3/uL (ref 0.0–0.1)
EOS%: 7 % (ref 0.0–7.0)
Eosinophils Absolute: 0.5 10*3/uL (ref 0.0–0.5)
HEMATOCRIT: 39.4 % (ref 38.4–49.9)
HGB: 13.2 g/dL (ref 13.0–17.1)
LYMPH#: 1.1 10*3/uL (ref 0.9–3.3)
LYMPH%: 16.9 % (ref 14.0–49.0)
MCH: 32.4 pg (ref 27.2–33.4)
MCHC: 33.5 g/dL (ref 32.0–36.0)
MCV: 96.6 fL (ref 79.3–98.0)
MONO#: 0.4 10*3/uL (ref 0.1–0.9)
MONO%: 6.2 % (ref 0.0–14.0)
NEUT#: 4.5 10*3/uL (ref 1.5–6.5)
NEUT%: 69.6 % (ref 39.0–75.0)
PLATELETS: 66 10*3/uL — AB (ref 140–400)
RBC: 4.08 10*6/uL — AB (ref 4.20–5.82)
RDW: 16.8 % — ABNORMAL HIGH (ref 11.0–14.6)
WBC: 6.5 10*3/uL (ref 4.0–10.3)

## 2017-03-15 LAB — COMPREHENSIVE METABOLIC PANEL
ALT: 27 U/L (ref 0–55)
ANION GAP: 5 meq/L (ref 3–11)
AST: 37 U/L — AB (ref 5–34)
Albumin: 3.5 g/dL (ref 3.5–5.0)
Alkaline Phosphatase: 127 U/L (ref 40–150)
BUN: 10.2 mg/dL (ref 7.0–26.0)
CALCIUM: 10.3 mg/dL (ref 8.4–10.4)
CHLORIDE: 109 meq/L (ref 98–109)
CO2: 25 meq/L (ref 22–29)
Creatinine: 0.9 mg/dL (ref 0.7–1.3)
EGFR: 89 mL/min/{1.73_m2} — ABNORMAL LOW (ref >90)
Glucose: 104 mg/dl (ref 70–140)
POTASSIUM: 4.4 meq/L (ref 3.5–5.1)
Sodium: 139 mEq/L (ref 136–145)
Total Bilirubin: 0.36 mg/dL (ref 0.20–1.20)
Total Protein: 6.5 g/dL (ref 6.4–8.3)

## 2017-03-15 LAB — CEA (IN HOUSE-CHCC): CEA (CHCC-IN HOUSE): 16.08 ng/mL — AB (ref 0.00–5.00)

## 2017-03-15 MED ORDER — HEPARIN SOD (PORK) LOCK FLUSH 100 UNIT/ML IV SOLN
500.0000 [IU] | Freq: Once | INTRAVENOUS | Status: DC | PRN
  Filled 2017-03-15: qty 5

## 2017-03-15 MED ORDER — SODIUM CHLORIDE 0.9% FLUSH
10.0000 mL | INTRAVENOUS | Status: DC | PRN
  Filled 2017-03-15: qty 10

## 2017-03-15 MED ORDER — LEUCOVORIN CALCIUM INJECTION 350 MG
400.0000 mg/m2 | Freq: Once | INTRAMUSCULAR | Status: AC
  Administered 2017-03-15: 892 mg via INTRAVENOUS
  Filled 2017-03-15: qty 44.6

## 2017-03-15 MED ORDER — SODIUM CHLORIDE 0.9 % IV SOLN
2400.0000 mg/m2 | INTRAVENOUS | Status: DC
  Administered 2017-03-15: 5350 mg via INTRAVENOUS
  Filled 2017-03-15: qty 107

## 2017-03-15 MED ORDER — FLUOROURACIL CHEMO INJECTION 2.5 GM/50ML
400.0000 mg/m2 | Freq: Once | INTRAVENOUS | Status: AC
  Administered 2017-03-15: 900 mg via INTRAVENOUS
  Filled 2017-03-15: qty 18

## 2017-03-15 NOTE — Progress Notes (Signed)
[place] OFFICE PROGRESS NOTE   Diagnosis: Colon cancer  INTERVAL HISTORY:   Nicholas Suarez returns as scheduled. He completed another cycle of FOLFOX on 03/01/2017. No nausea/vomiting, mouth sores, or diarrhea. He had cold sensitivity and mild numbness in the fingers following chemotherapy. This has improved. The numbness did not interfere with activity. The right leg swelling increases with prolonged ambulation.  Objective:  Vital signs in last 24 hours:  Blood pressure 115/63, pulse 63, temperature 97.8 F (36.6 C), temperature source Oral, resp. rate 17, height _0  (1.905 m), weight 206 lb 8 oz (93.7 kg), SpO2 100 %.    HEENT: No thrush or ulcers Resp: Lungs clear bilaterally Cardio: Regular rate and rhythm GI: No hepatomegaly, nontender Vascular: Trace edema at the right lower leg Neuro: Mild loss of vibratory sense at the fingertips bilaterally     Portacath/PICC-without erythema  Lab Results:  Lab Results  Component Value Date   WBC 6.5 03/15/2017   HGB 13.2 03/15/2017   HCT 39.4 03/15/2017   MCV 96.6 03/15/2017   PLT 66 (L) 03/15/2017   NEUTROABS 4.5 03/15/2017    CMP     Component Value Date/Time   NA 139 03/15/2017 0901   K 4.4 03/15/2017 0901   CL 108 01/29/2017 0835   CO2 25 03/15/2017 0901   GLUCOSE 104 03/15/2017 0901   BUN 10.2 03/15/2017 0901   CREATININE 0.9 03/15/2017 0901   CALCIUM 10.3 03/15/2017 0901   PROT 6.5 03/15/2017 0901   ALBUMIN 3.5 03/15/2017 0901   AST 37 (H) 03/15/2017 0901   ALT 27 03/15/2017 0901   ALKPHOS 127 03/15/2017 0901   BILITOT 0.36 03/15/2017 0901   GFRNONAA >60 01/29/2017 0835   GFRAA >60 01/29/2017 0835    Lab Results  Component Value Date   CEA1 15.63 (H) 03/01/2017    Medications: I have reviewed the patient's current medications.  Assessment/Plan: 1. Rectal cancer  MRI lumbar spine 10/20/2016 with bilateral hydronephrosis and retroperitoneal adenopathy  CT abdomen/pelvis  10/20/2016 with extensive retroperitoneal process with marked interstitial thickening and lymphadenopathy; bilateral hydronephrosis; similar ill-defined soft tissue density and skin thickening around the umbilicus; diffuse bladder wall thickening slightly asymmetric at the bladder dome but no obvious bladder mass  Biopsy para-aortic lymph node 11/10/2016-metastatic adenocarcinoma consistent with GI primary  11/23/2016 CEA elevated, CA-19-9 normal  Colonoscopy 11/27/2016-fungating infiltrative nonobstructing mass in the distal rectum involving the anal verge. The rectum was fixed and frozen in the pelvis precluding passage of scope beyond the distal sigmoid colon. 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  Foundation 1-microsatellite stable; tumor mutational burden 5; NOTCH3amplification; NOTCH3rearrangement exon 14  Upper endoscopy 11/27/2016-normal.  PET scan 06/22/2018showed minimal activity in the primary rectal carcinoma, diffuse wall thickening; mild activity associate with retroperitoneal lymph nodes; persistent haziness within the retroperitoneum. No evidence of liver metastasis or distant metastasis.  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 Retroperitoneal lymphadenopathy secondary to  #1  3. Bilateral hydronephrosis status post evaluation by urology 4. Asymmetry of the bladder dome on CT 10/20/2016 status post cystoscopy with biopsy planned 12/11/2016 (Dr. 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 Change in bowel habits, secondary to #1-improved. 6. Right leg edema, question due to lymphatic obstruction;venous Doppler negative for DVT 11/23/2016. 7. Port-A-Cath placement 12/05/2016 8. Thrombocytopenia secondary to chemotherapy; oxaliplatin dose reduced with cycle 3 FOLFOX. Progressive thrombocytopenia 01/17/2017; oxaliplatin held 01/17/2017. 9. Right lower extremity cellulitis 01/27/2017-blood culture positive for methicillin sensitive staph aureus; Port-A-Cath removed. PICC line placed. TEE 01/30/2017 without evidence of vegetation. 14 days of Ancef recommended after Port-A-Cath removal with end date 02/14/2017. 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 He has developed mild oxaliplatin neuropathy. He has moderate thrombocytopenia secondary to chemotherapy. We decided to hold oxaliplatin with chemotherapy today. He will complete a cycle of 5-fluorouracil. Nicholas Suarez will return for an office visit and chemotherapy in 2 weeks.  The plan is to schedule a restaging CT after 9-10 total cycles of systemic therapy. 25 minutes were spent with the patient today. The majority of the time was used for counseling and coordination of care.  Donneta Romberg, MD  03/15/2017  10:22 AM

## 2017-03-15 NOTE — Progress Notes (Signed)
OK to treat with PLT 66. Oxaliplatin will be held this cycle. No premeds needed.

## 2017-03-17 ENCOUNTER — Ambulatory Visit (HOSPITAL_BASED_OUTPATIENT_CLINIC_OR_DEPARTMENT_OTHER): Payer: Federal, State, Local not specified - PPO

## 2017-03-17 VITALS — BP 128/84 | HR 77 | Temp 97.7°F | Resp 16

## 2017-03-17 DIAGNOSIS — C20 Malignant neoplasm of rectum: Secondary | ICD-10-CM

## 2017-03-17 MED ORDER — HEPARIN SOD (PORK) LOCK FLUSH 100 UNIT/ML IV SOLN
500.0000 [IU] | Freq: Once | INTRAVENOUS | Status: AC | PRN
  Administered 2017-03-17: 500 [IU]
  Filled 2017-03-17: qty 5

## 2017-03-17 MED ORDER — SODIUM CHLORIDE 0.9% FLUSH
10.0000 mL | INTRAVENOUS | Status: DC | PRN
  Administered 2017-03-17: 10 mL
  Filled 2017-03-17: qty 10

## 2017-03-17 NOTE — Patient Instructions (Signed)

## 2017-03-29 ENCOUNTER — Other Ambulatory Visit (HOSPITAL_BASED_OUTPATIENT_CLINIC_OR_DEPARTMENT_OTHER): Payer: Federal, State, Local not specified - PPO

## 2017-03-29 ENCOUNTER — Ambulatory Visit (HOSPITAL_BASED_OUTPATIENT_CLINIC_OR_DEPARTMENT_OTHER): Payer: Federal, State, Local not specified - PPO

## 2017-03-29 ENCOUNTER — Telehealth: Payer: Self-pay | Admitting: Oncology

## 2017-03-29 ENCOUNTER — Ambulatory Visit (HOSPITAL_BASED_OUTPATIENT_CLINIC_OR_DEPARTMENT_OTHER): Payer: Federal, State, Local not specified - PPO | Admitting: Oncology

## 2017-03-29 ENCOUNTER — Ambulatory Visit: Payer: Federal, State, Local not specified - PPO

## 2017-03-29 VITALS — BP 123/72 | HR 69 | Temp 97.9°F | Resp 17 | Ht 75.0 in | Wt 211.4 lb

## 2017-03-29 DIAGNOSIS — C674 Malignant neoplasm of posterior wall of bladder: Secondary | ICD-10-CM

## 2017-03-29 DIAGNOSIS — G620 Drug-induced polyneuropathy: Secondary | ICD-10-CM

## 2017-03-29 DIAGNOSIS — C770 Secondary and unspecified malignant neoplasm of lymph nodes of head, face and neck: Secondary | ICD-10-CM

## 2017-03-29 DIAGNOSIS — C20 Malignant neoplasm of rectum: Secondary | ICD-10-CM

## 2017-03-29 DIAGNOSIS — Z5111 Encounter for antineoplastic chemotherapy: Secondary | ICD-10-CM

## 2017-03-29 DIAGNOSIS — C801 Malignant (primary) neoplasm, unspecified: Secondary | ICD-10-CM

## 2017-03-29 LAB — CBC WITH DIFFERENTIAL/PLATELET
BASO%: 1.5 % (ref 0.0–2.0)
BASOS ABS: 0.1 10*3/uL (ref 0.0–0.1)
EOS%: 11 % — AB (ref 0.0–7.0)
Eosinophils Absolute: 0.4 10*3/uL (ref 0.0–0.5)
HCT: 38.1 % — ABNORMAL LOW (ref 38.4–49.9)
HEMOGLOBIN: 12.8 g/dL — AB (ref 13.0–17.1)
LYMPH%: 30 % (ref 14.0–49.0)
MCH: 32.2 pg (ref 27.2–33.4)
MCHC: 33.6 g/dL (ref 32.0–36.0)
MCV: 96 fL (ref 79.3–98.0)
MONO#: 0.5 10*3/uL (ref 0.1–0.9)
MONO%: 14.5 % — AB (ref 0.0–14.0)
NEUT#: 1.5 10*3/uL (ref 1.5–6.5)
NEUT%: 43 % (ref 39.0–75.0)
Platelets: 114 10*3/uL — ABNORMAL LOW (ref 140–400)
RBC: 3.97 10*6/uL — AB (ref 4.20–5.82)
RDW: 15.6 % — ABNORMAL HIGH (ref 11.0–14.6)
WBC: 3.4 10*3/uL — ABNORMAL LOW (ref 4.0–10.3)
lymph#: 1 10*3/uL (ref 0.9–3.3)

## 2017-03-29 LAB — COMPREHENSIVE METABOLIC PANEL
ALT: 24 U/L (ref 0–55)
AST: 31 U/L (ref 5–34)
Albumin: 3.5 g/dL (ref 3.5–5.0)
Alkaline Phosphatase: 95 U/L (ref 40–150)
Anion Gap: 5 mEq/L (ref 3–11)
BUN: 13.6 mg/dL (ref 7.0–26.0)
CHLORIDE: 109 meq/L (ref 98–109)
CO2: 25 meq/L (ref 22–29)
Calcium: 10.1 mg/dL (ref 8.4–10.4)
Creatinine: 0.9 mg/dL (ref 0.7–1.3)
GLUCOSE: 79 mg/dL (ref 70–140)
POTASSIUM: 4.5 meq/L (ref 3.5–5.1)
SODIUM: 139 meq/L (ref 136–145)
Total Bilirubin: 0.37 mg/dL (ref 0.20–1.20)
Total Protein: 6.4 g/dL (ref 6.4–8.3)

## 2017-03-29 LAB — CEA (IN HOUSE-CHCC): CEA (CHCC-In House): 18.04 ng/mL — ABNORMAL HIGH (ref 0.00–5.00)

## 2017-03-29 MED ORDER — FLUOROURACIL CHEMO INJECTION 2.5 GM/50ML
400.0000 mg/m2 | Freq: Once | INTRAVENOUS | Status: AC
Start: 2017-03-29 — End: 2017-03-29
  Administered 2017-03-29: 900 mg via INTRAVENOUS
  Filled 2017-03-29: qty 18

## 2017-03-29 MED ORDER — OXALIPLATIN CHEMO INJECTION 100 MG/20ML
55.0000 mg/m2 | Freq: Once | INTRAVENOUS | Status: AC
  Administered 2017-03-29: 125 mg via INTRAVENOUS
  Filled 2017-03-29: qty 25

## 2017-03-29 MED ORDER — PALONOSETRON HCL INJECTION 0.25 MG/5ML
INTRAVENOUS | Status: AC
  Filled 2017-03-29: qty 5

## 2017-03-29 MED ORDER — DEXAMETHASONE SODIUM PHOSPHATE 10 MG/ML IJ SOLN
INTRAMUSCULAR | Status: AC
  Filled 2017-03-29: qty 1

## 2017-03-29 MED ORDER — DEXTROSE 5 % IV SOLN
Freq: Once | INTRAVENOUS | Status: AC
  Administered 2017-03-29: 11:00:00 via INTRAVENOUS

## 2017-03-29 MED ORDER — FLUOROURACIL CHEMO INJECTION 5 GM/100ML
2400.0000 mg/m2 | INTRAVENOUS | Status: DC
  Administered 2017-03-29: 5350 mg via INTRAVENOUS
  Filled 2017-03-29: qty 107

## 2017-03-29 MED ORDER — PALONOSETRON HCL INJECTION 0.25 MG/5ML
0.2500 mg | Freq: Once | INTRAVENOUS | Status: AC
  Administered 2017-03-29: 0.25 mg via INTRAVENOUS

## 2017-03-29 MED ORDER — DEXAMETHASONE SODIUM PHOSPHATE 10 MG/ML IJ SOLN
10.0000 mg | Freq: Once | INTRAMUSCULAR | Status: AC
  Administered 2017-03-29: 10 mg via INTRAVENOUS

## 2017-03-29 MED ORDER — LEUCOVORIN CALCIUM INJECTION 350 MG
400.0000 mg/m2 | Freq: Once | INTRAVENOUS | Status: AC
  Administered 2017-03-29: 892 mg via INTRAVENOUS
  Filled 2017-03-29: qty 44.6

## 2017-03-29 NOTE — Patient Instructions (Signed)
[facility] Discharge Instructions for Patients Receiving Chemotherapy  Today you received the following chemotherapy agents: Oxaliplatin, Leucovorin, and 5FU.  To help prevent nausea and vomiting after your treatment, we encourage you to take your nausea medication as directed.   If you develop nausea and vomiting that is not controlled by your nausea medication, call the clinic.   BELOW ARE SYMPTOMS THAT SHOULD BE REPORTED IMMEDIATELY:  *FEVER GREATER THAN 100.5 F  *CHILLS WITH OR WITHOUT FEVER  NAUSEA AND VOMITING THAT IS NOT CONTROLLED WITH YOUR NAUSEA MEDICATION  *UNUSUAL SHORTNESS OF BREATH  *UNUSUAL BRUISING OR BLEEDING  TENDERNESS IN MOUTH AND THROAT WITH OR WITHOUT PRESENCE OF ULCERS  *URINARY PROBLEMS  *BOWEL PROBLEMS  UNUSUAL RASH Items with * indicate a potential emergency and should be followed up as soon as possible.  Feel free to call the clinic should you have any questions or concerns. The clinic phone number is [phone].  Please show the CHEMO ALERT CARD at check-in to the Emergency Department and triage nurse.    

## 2017-03-29 NOTE — Progress Notes (Signed)
[facility] PROGRESS NOTE   Diagnosis: Colon cancer  INTERVAL HISTORY:   Nicholas Suarez returns as scheduled. He completed another cycle of chemotherapy with 5 fluorouracil on 03/15/2017. Oxaliplatin was held secondary to thrombocytopenia. He reports mild intermittent numbness in the fingers. This does not interfere with activity. No other complaint.   Objective:  Vital signs in last 24 hours:  Blood pressure 123/72, pulse 69, temperature 97.9 F (36.6 C), temperature source Oral, resp. rate 17, height '6\' 3"'  (1.905 m), weight 211 lb 6.4 oz (95.9 kg), SpO2 100 %.    HEENT: No thrush or ulcers Resp: Lungs clear bilaterally Cardio: Regular rate and rhythm GI: No hepatosplenomegaly, nontender Vascular: The right lower leg is slightly larger than the left side, no edema Neuro: Mild to moderate loss of vibratory sense at the fingertips bilaterally  Skin: Palms without erythema   Portacath/PICC-without erythema  Lab Results:  Lab Results  Component Value Date   WBC 3.4 (L) 03/29/2017   HGB 12.8 (L) 03/29/2017   HCT 38.1 (L) 03/29/2017   MCV 96.0 03/29/2017   PLT 114 (L) 03/29/2017   NEUTROABS 1.5 03/29/2017    CMP     Component Value Date/Time   NA 139 03/15/2017 0901   K 4.4 03/15/2017 0901   CL 108 01/29/2017 0835   CO2 25 03/15/2017 0901   GLUCOSE 104 03/15/2017 0901   BUN 10.2 03/15/2017 0901   CREATININE 0.9 03/15/2017 0901   CALCIUM 10.3 03/15/2017 0901   PROT 6.5 03/15/2017 0901   ALBUMIN 3.5 03/15/2017 0901   AST 37 (H) 03/15/2017 0901   ALT 27 03/15/2017 0901   ALKPHOS 127 03/15/2017 0901   BILITOT 0.36 03/15/2017 0901   GFRNONAA >60 01/29/2017 0835   GFRAA >60 01/29/2017 0835     Medications: I have reviewed the patient's current medications.  Assessment/Plan: 1. Rectal cancer  MRI lumbar spine 10/20/2016 with bilateral hydronephrosis and retroperitoneal adenopathy  CT abdomen/pelvis 10/20/2016 with extensive retroperitoneal  process with marked interstitial thickening and lymphadenopathy; bilateral hydronephrosis; similar ill-defined soft tissue density and skin thickening around the umbilicus; diffuse bladder wall thickening slightly asymmetric at the bladder dome but no obvious bladder mass  Biopsy para-aortic lymph node 11/10/2016-metastatic adenocarcinoma consistent with GI primary  11/23/2016 CEA elevated, CA-19-9 normal  Colonoscopy 11/27/2016-fungating infiltrative nonobstructing mass in the distal rectum involving the anal verge. The rectum was fixed and frozen in the pelvis precluding passage of scope beyond the distal sigmoid colon. 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  Foundation 1-microsatellite stable; tumor mutational burden 5; NOTCH3amplification; NOTCH3rearrangement exon 14  Upper endoscopy 11/27/2016-normal.  PET scan 06/22/2018showed minimal activity in the primary rectal carcinoma, diffuse wall thickening; mild activity associate with retroperitoneal lymph nodes; persistent haziness within the retroperitoneum. No evidence of liver metastasis or distant metastasis.  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  Cycle 2 FOLFOX 12/20/2016  Cycle 3 FOLFOX 01/03/2017 (oxaliplatin dose reduced secondary to thrombocytopenia)   Cycle 4 FOLFOX 01/17/2017 (oxaliplatin held due to thrombocytopenia)  Cycle 5 FOLFOX 02/15/2017  CTs 02/26/2017-new sclerotic lesions in the spine and right iliac, no residual adenopathy, persistent rectal wall thickening  Cycle 6 FOLFOX 03/01/2017 (Neulasta added, oxaliplatin further dose reduced)   Cycle 7 FOLFOX 03/15/2017 (oxaliplatin held secondary to thrombocytopenia)  Cycle 8 FOLFOX 03/29/2017 2. Retroperitoneal lymphadenopathy secondary to  #1 3. Bilateral hydronephrosis status  post evaluation by urology 4. Asymmetry of the bladder dome on CT 10/20/2016 status post cystoscopy with biopsy planned 12/11/2016 (Dr. 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 Change in bowel habits, secondary to #1-improved. 6. Right leg edema, question due to lymphatic obstruction;venous Doppler negative for DVT 11/23/2016. 7. Port-A-Cath placement 12/05/2016 8. Thrombocytopenia secondary to chemotherapy; oxaliplatin dose reduced with cycle 3 FOLFOX. Progressive thrombocytopenia 01/17/2017; oxaliplatin held 01/17/2017. 9. Right lower extremity cellulitis 01/27/2017-blood culture positive for methicillin sensitive staph aureus; Port-A-Cath removed. PICC line placed. TEE 01/30/2017 without evidence of vegetation. 14 days of Ancef recommended after Port-A-Cath removal with end date 02/14/2017. 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 The plan is to proceed with another cycle of FOLFOX today. Oxaliplatin and Neulasta will be added back into the chemotherapy regimen. He has mild oxaliplatin neuropathy. We will discontinue oxaliplatin if the neuropathy symptoms progress. We will follow-up on the CEA from today. The CEA was slightly higher on 03/15/2017.  Nicholas Suarez will be scheduled for a restaging CT evaluation after 2 more cycles of chemotherapy.  25 minutes were spent with the patient today. The majority of the time was used for counseling and coordination of care.  Donneta Romberg, MD  03/29/2017  10:16 AM

## 2017-03-29 NOTE — Telephone Encounter (Signed)
Gave avs and calendar for November  °

## 2017-03-31 ENCOUNTER — Ambulatory Visit (HOSPITAL_BASED_OUTPATIENT_CLINIC_OR_DEPARTMENT_OTHER): Payer: Federal, State, Local not specified - PPO

## 2017-03-31 VITALS — BP 115/69 | HR 79 | Temp 97.7°F | Resp 18

## 2017-03-31 DIAGNOSIS — C20 Malignant neoplasm of rectum: Secondary | ICD-10-CM

## 2017-03-31 DIAGNOSIS — Z5189 Encounter for other specified aftercare: Secondary | ICD-10-CM | POA: Diagnosis not present

## 2017-03-31 MED ORDER — HEPARIN SOD (PORK) LOCK FLUSH 100 UNIT/ML IV SOLN
500.0000 [IU] | Freq: Once | INTRAVENOUS | Status: AC | PRN
  Administered 2017-03-31: 250 [IU]
  Filled 2017-03-31: qty 5

## 2017-03-31 MED ORDER — SODIUM CHLORIDE 0.9% FLUSH
10.0000 mL | INTRAVENOUS | Status: DC | PRN
  Administered 2017-03-31: 10 mL
  Filled 2017-03-31: qty 10

## 2017-03-31 MED ORDER — PEGFILGRASTIM INJECTION 6 MG/0.6ML ~~LOC~~
6.0000 mg | PREFILLED_SYRINGE | Freq: Once | SUBCUTANEOUS | Status: AC
  Administered 2017-03-31: 6 mg via SUBCUTANEOUS

## 2017-03-31 NOTE — Patient Instructions (Signed)
Implanted Port Home Guide An implanted port is a type of central line that is placed under the skin. Central lines are used to provide IV access when treatment or nutrition needs to be given through a person's veins. Implanted ports are used for long-term IV access. An implanted port may be placed because:  You need IV medicine that would be irritating to the small veins in your hands or arms.  You need long-term IV medicines, such as antibiotics.  You need IV nutrition for a long period.  You need frequent blood draws for lab tests.  You need dialysis. Implanted ports are usually placed in the chest area, but they can also be placed in the upper arm, the abdomen, or the leg. An implanted port has two main parts:  Reservoir. The reservoir is round and will appear as a small, raised area under your skin. The reservoir is the part where a needle is inserted to give medicines or draw blood.  Catheter. The catheter is a thin, flexible tube that extends from the reservoir. The catheter is placed into a large vein. Medicine that is inserted into the reservoir goes into the catheter and then into the vein. How will I care for my incision site? Do not get the incision site wet. Bathe or shower as directed by your health care provider. How is my port accessed? Special steps must be taken to access the port:  Before the port is accessed, a numbing cream can be placed on the skin. This helps numb the skin over the port site.  Your health care provider uses a sterile technique to access the port.  Your health care provider must put on a mask and sterile gloves.  The skin over your port is cleaned carefully with an antiseptic and allowed to dry.  The port is gently pinched between sterile gloves, and a needle is inserted into the port.  Only "non-coring" port needles should be used to access the port. Once the port is accessed, a blood return should be checked. This helps ensure that the port is  in the vein and is not clogged.  If your port needs to remain accessed for a constant infusion, a clear (transparent) bandage will be placed over the needle site. The bandage and needle will need to be changed every week, or as directed by your health care provider.  Keep the bandage covering the needle clean and dry. Do not get it wet. Follow your health care provider's instructions on how to take a shower or bath while the port is accessed.  If your port does not need to stay accessed, no bandage is needed over the port. What is flushing? Flushing helps keep the port from getting clogged. Follow your health care provider's instructions on how and when to flush the port. Ports are usually flushed with saline solution or a medicine called heparin. The need for flushing will depend on how the port is used.  If the port is used for intermittent medicines or blood draws, the port will need to be flushed:  After medicines have been given.  After blood has been drawn.  As part of routine maintenance.  If a constant infusion is running, the port may not need to be flushed. How long will my port stay implanted? The port can stay in for as long as your health care provider thinks it is needed. When it is time for the port to come out, surgery will be done to remove it.   The procedure is similar to the one performed when the port was put in. When should I seek immediate medical care? When you have an implanted port, you should seek immediate medical care if:  You notice a bad smell coming from the incision site.  You have swelling, redness, or drainage at the incision site.  You have more swelling or pain at the port site or the surrounding area.  You have a fever that is not controlled with medicine. This information is not intended to replace advice given to you by your health care provider. Make sure you discuss any questions you have with your health care provider. Document Released:  05/29/2005 Document Revised: 11/04/2015 Document Reviewed: 02/03/2013 Elsevier Interactive Patient Education  2017 [employer] Pegfilgrastim injection What is this medicine? PEGFILGRASTIM (PEG fil gra stim) is a long-acting granulocyte colony-stimulating factor that stimulates the growth of neutrophils, a type of white blood cell important in the body's fight against infection. It is used to reduce the incidence of fever and infection in patients with certain types of cancer who are receiving chemotherapy that affects the bone marrow, and to increase survival after being exposed to high doses of radiation. This medicine may be used for other purposes; ask your health care provider or pharmacist if you have questions. COMMON BRAND NAME(S): Neulasta What should I tell my health care provider before I take this medicine? They need to know if you have any of these conditions: -kidney disease -latex allergy -ongoing radiation therapy -sickle cell disease -skin reactions to acrylic adhesives (On-Body Injector only) -an unusual or allergic reaction to pegfilgrastim, filgrastim, other medicines, foods, dyes, or preservatives -pregnant or trying to get pregnant -breast-feeding How should I use this medicine? This medicine is for injection under the skin. If you get this medicine at home, you will be taught how to prepare and give the pre-filled syringe or how to use the On-body Injector. Refer to the patient Instructions for Use for detailed instructions. Use exactly as directed. Tell your healthcare provider immediately if you suspect that the On-body Injector may not have performed as intended or if you suspect the use of the On-body Injector resulted in a missed or partial dose. It is important that you put your used needles and syringes in a special sharps container. Do not put them in a trash can. If you do not have a sharps container, call your pharmacist or healthcare provider to get one. Talk  to your pediatrician regarding the use of this medicine in children. While this drug may be prescribed for selected conditions, precautions do apply. Overdosage: If you think you have taken too much of this medicine contact a poison control center or emergency room at once. NOTE: This medicine is only for you. Do not share this medicine with others. What if I miss a dose? It is important not to miss your dose. Call your doctor or health care professional if you miss your dose. If you miss a dose due to an On-body Injector failure or leakage, a new dose should be administered as soon as possible using a single prefilled syringe for manual use. What may interact with this medicine? Interactions have not been studied. Give your health care provider a list of all the medicines, herbs, non-prescription drugs, or dietary supplements you use. Also tell them if you smoke, drink alcohol, or use illegal drugs. Some items may interact with your medicine. This list may not describe all possible interactions. Give your health care provider   a list of all the medicines, herbs, non-prescription drugs, or dietary supplements you use. Also tell them if you smoke, drink alcohol, or use illegal drugs. Some items may interact with your medicine. What should I watch for while using this medicine? You may need blood work done while you are taking this medicine. If you are going to need a MRI, CT scan, or other procedure, tell your doctor that you are using this medicine (On-Body Injector only). What side effects may I notice from receiving this medicine? Side effects that you should report to your doctor or health care professional as soon as possible: -allergic reactions like skin rash, itching or hives, swelling of the face, lips, or tongue -dizziness -fever -pain, redness, or irritation at site where injected -pinpoint red spots on the skin -red or dark-brown urine -shortness of breath or breathing  problems -stomach or side pain, or pain at the shoulder -swelling -tiredness -trouble passing urine or change in the amount of urine Side effects that usually do not require medical attention (report to your doctor or health care professional if they continue or are bothersome): -bone pain -muscle pain This list may not describe all possible side effects. Call your doctor for medical advice about side effects. You may report side effects to FDA at 1-800-FDA-1088. Where should I keep my medicine? Keep out of the reach of children. Store pre-filled syringes in a refrigerator between 2 and 8 degrees C (36 and 46 degrees F). Do not freeze. Keep in carton to protect from light. Throw away this medicine if it is left out of the refrigerator for more than 48 hours. Throw away any unused medicine after the expiration date. NOTE: This sheet is a summary. It may not cover all possible information. If you have questions about this medicine, talk to your doctor, pharmacist, or health care provider.  2018 Elsevier/Gold Standard (2016-05-25 12:58:03)  

## 2017-03-31 NOTE — Progress Notes (Signed)
Pt reports to clinic for pump d/c, 9 mls remaining in pump, 5 mls bolused per protocol and pt requests that the remainder is wasted.

## 2017-04-03 MED ORDER — MIDAZOLAM HCL 2 MG/2ML IJ SOLN
INTRAMUSCULAR | Status: AC
  Filled 2017-04-03: qty 4

## 2017-04-03 MED ORDER — FENTANYL CITRATE (PF) 100 MCG/2ML IJ SOLN
INTRAMUSCULAR | Status: AC
  Filled 2017-04-03: qty 4

## 2017-04-04 ENCOUNTER — Ambulatory Visit (HOSPITAL_BASED_OUTPATIENT_CLINIC_OR_DEPARTMENT_OTHER): Payer: Federal, State, Local not specified - PPO

## 2017-04-04 DIAGNOSIS — Z452 Encounter for adjustment and management of vascular access device: Secondary | ICD-10-CM | POA: Diagnosis not present

## 2017-04-04 DIAGNOSIS — C20 Malignant neoplasm of rectum: Secondary | ICD-10-CM

## 2017-04-04 DIAGNOSIS — Z95828 Presence of other vascular implants and grafts: Secondary | ICD-10-CM

## 2017-04-04 MED ORDER — SODIUM CHLORIDE 0.9% FLUSH
10.0000 mL | Freq: Once | INTRAVENOUS | Status: AC
  Administered 2017-04-04: 10 mL
  Filled 2017-04-04: qty 10

## 2017-04-04 MED ORDER — HEPARIN SOD (PORK) LOCK FLUSH 100 UNIT/ML IV SOLN
250.0000 [IU] | Freq: Once | INTRAVENOUS | Status: AC
  Administered 2017-04-04: 250 [IU]
  Filled 2017-04-04: qty 5

## 2017-04-07 ENCOUNTER — Other Ambulatory Visit: Payer: Self-pay | Admitting: Oncology

## 2017-04-12 ENCOUNTER — Ambulatory Visit (HOSPITAL_BASED_OUTPATIENT_CLINIC_OR_DEPARTMENT_OTHER): Payer: Federal, State, Local not specified - PPO

## 2017-04-12 ENCOUNTER — Ambulatory Visit (HOSPITAL_BASED_OUTPATIENT_CLINIC_OR_DEPARTMENT_OTHER): Payer: Federal, State, Local not specified - PPO | Admitting: Oncology

## 2017-04-12 ENCOUNTER — Telehealth: Payer: Self-pay | Admitting: Oncology

## 2017-04-12 ENCOUNTER — Ambulatory Visit: Payer: Federal, State, Local not specified - PPO

## 2017-04-12 ENCOUNTER — Other Ambulatory Visit (HOSPITAL_BASED_OUTPATIENT_CLINIC_OR_DEPARTMENT_OTHER): Payer: Federal, State, Local not specified - PPO

## 2017-04-12 VITALS — BP 106/59 | HR 64 | Temp 97.7°F | Resp 18 | Ht 75.0 in | Wt 208.9 lb

## 2017-04-12 DIAGNOSIS — R040 Epistaxis: Secondary | ICD-10-CM

## 2017-04-12 DIAGNOSIS — C674 Malignant neoplasm of posterior wall of bladder: Secondary | ICD-10-CM

## 2017-04-12 DIAGNOSIS — C20 Malignant neoplasm of rectum: Secondary | ICD-10-CM

## 2017-04-12 DIAGNOSIS — L98499 Non-pressure chronic ulcer of skin of other sites with unspecified severity: Secondary | ICD-10-CM | POA: Diagnosis not present

## 2017-04-12 DIAGNOSIS — C770 Secondary and unspecified malignant neoplasm of lymph nodes of head, face and neck: Secondary | ICD-10-CM

## 2017-04-12 DIAGNOSIS — G620 Drug-induced polyneuropathy: Secondary | ICD-10-CM | POA: Diagnosis not present

## 2017-04-12 DIAGNOSIS — Z5111 Encounter for antineoplastic chemotherapy: Secondary | ICD-10-CM

## 2017-04-12 DIAGNOSIS — D696 Thrombocytopenia, unspecified: Secondary | ICD-10-CM | POA: Diagnosis not present

## 2017-04-12 DIAGNOSIS — Z95828 Presence of other vascular implants and grafts: Secondary | ICD-10-CM

## 2017-04-12 LAB — CBC WITH DIFFERENTIAL/PLATELET
BASO%: 0.2 % (ref 0.0–2.0)
BASOS ABS: 0 10*3/uL (ref 0.0–0.1)
EOS ABS: 0.5 10*3/uL (ref 0.0–0.5)
EOS%: 10.4 % — ABNORMAL HIGH (ref 0.0–7.0)
HCT: 38.7 % (ref 38.4–49.9)
HGB: 12.9 g/dL — ABNORMAL LOW (ref 13.0–17.1)
LYMPH%: 16.7 % (ref 14.0–49.0)
MCH: 32.6 pg (ref 27.2–33.4)
MCHC: 33.3 g/dL (ref 32.0–36.0)
MCV: 97.9 fL (ref 79.3–98.0)
MONO#: 0.2 10*3/uL (ref 0.1–0.9)
MONO%: 3.9 % (ref 0.0–14.0)
NEUT%: 68.8 % (ref 39.0–75.0)
NEUTROS ABS: 3.5 10*3/uL (ref 1.5–6.5)
PLATELETS: 78 10*3/uL — AB (ref 140–400)
RBC: 3.95 10*6/uL — AB (ref 4.20–5.82)
RDW: 17.1 % — ABNORMAL HIGH (ref 11.0–14.6)
WBC: 5.1 10*3/uL (ref 4.0–10.3)
lymph#: 0.9 10*3/uL (ref 0.9–3.3)

## 2017-04-12 LAB — COMPREHENSIVE METABOLIC PANEL
ALT: 29 U/L (ref 0–55)
ANION GAP: 6 meq/L (ref 3–11)
AST: 35 U/L — ABNORMAL HIGH (ref 5–34)
Albumin: 3.4 g/dL — ABNORMAL LOW (ref 3.5–5.0)
Alkaline Phosphatase: 123 U/L (ref 40–150)
BILIRUBIN TOTAL: 0.55 mg/dL (ref 0.20–1.20)
BUN: 15.4 mg/dL (ref 7.0–26.0)
CHLORIDE: 108 meq/L (ref 98–109)
CO2: 24 meq/L (ref 22–29)
Calcium: 9.8 mg/dL (ref 8.4–10.4)
Creatinine: 0.9 mg/dL (ref 0.7–1.3)
GLUCOSE: 133 mg/dL (ref 70–140)
POTASSIUM: 4.1 meq/L (ref 3.5–5.1)
SODIUM: 138 meq/L (ref 136–145)
Total Protein: 5.9 g/dL — ABNORMAL LOW (ref 6.4–8.3)

## 2017-04-12 MED ORDER — DEXTROSE 5 % IV SOLN
400.0000 mg/m2 | Freq: Once | INTRAVENOUS | Status: AC
  Administered 2017-04-12: 892 mg via INTRAVENOUS
  Filled 2017-04-12: qty 44.6

## 2017-04-12 MED ORDER — SODIUM CHLORIDE 0.9 % IV SOLN
Freq: Once | INTRAVENOUS | Status: AC
  Administered 2017-04-12: 10:00:00 via INTRAVENOUS

## 2017-04-12 MED ORDER — SODIUM CHLORIDE 0.9 % IV SOLN
2400.0000 mg/m2 | INTRAVENOUS | Status: DC
  Administered 2017-04-12: 5350 mg via INTRAVENOUS
  Filled 2017-04-12: qty 107

## 2017-04-12 MED ORDER — FLUOROURACIL CHEMO INJECTION 2.5 GM/50ML
400.0000 mg/m2 | Freq: Once | INTRAVENOUS | Status: AC
  Administered 2017-04-12: 900 mg via INTRAVENOUS
  Filled 2017-04-12: qty 18

## 2017-04-12 MED ORDER — SODIUM CHLORIDE 0.9% FLUSH
10.0000 mL | Freq: Once | INTRAVENOUS | Status: AC
  Administered 2017-04-12: 10 mL
  Filled 2017-04-12: qty 10

## 2017-04-12 NOTE — Telephone Encounter (Signed)
Scheduled appt per 11/1 los - patient to get an updated schedule in the treatment area.  

## 2017-04-12 NOTE — Patient Instructions (Signed)

## 2017-04-12 NOTE — Progress Notes (Signed)
Treat despite PLT 78 today. Oxaliplatin will be held this cycle. Premeds and Neulasta are also on hold. Melia, Infusion RN made aware.

## 2017-04-12 NOTE — Progress Notes (Signed)
[place] OFFICE PROGRESS NOTE   Diagnosis: Rectal cancer  INTERVAL HISTORY:   Mr. Even returns as scheduled.  He completed another cycle of FOLFOX on 03/29/2017.  He reports increased peripheral numbness following the cycle of chemotherapy.  This has resolved.  He reports mild nose bleeding.  He has an intermittent ulcer at the inner left lower lip. Objective:  Vital signs in last 24 hours:  Blood pressure (!) 106/59, pulse 64, temperature 97.7 F (36.5 C), temperature source Oral, resp. rate 18, height '6\' 3"'  (1.905 m), weight 208 lb 14.4 oz (94.8 kg), SpO2 99 %.    HEENT: No thrush or ulcers. Lymphatics: No cervical or supraclavicular nodes Resp: Lungs clear bilaterally Cardio: Regular rate and rhythm GI: No hepatomegaly, nontender Vascular: Trace edema to right lower leg with a support stocking in place Neuro: Moderate loss of vibratory sense at the fingertips bilaterally    Portacath/PICC-without erythema  Lab Results:  Lab Results  Component Value Date   WBC 5.1 04/12/2017   HGB 12.9 (L) 04/12/2017   HCT 38.7 04/12/2017   MCV 97.9 04/12/2017   PLT 78 (L) 04/12/2017   NEUTROABS 3.5 04/12/2017    CMP     Component Value Date/Time   NA 138 04/12/2017 0803   K 4.1 04/12/2017 0803   CL 108 01/29/2017 0835   CO2 24 04/12/2017 0803   GLUCOSE 133 04/12/2017 0803   BUN 15.4 04/12/2017 0803   CREATININE 0.9 04/12/2017 0803   CALCIUM 9.8 04/12/2017 0803   PROT 5.9 (L) 04/12/2017 0803   ALBUMIN 3.4 (L) 04/12/2017 0803   AST 35 (H) 04/12/2017 0803   ALT 29 04/12/2017 0803   ALKPHOS 123 04/12/2017 0803   BILITOT 0.55 04/12/2017 0803   GFRNONAA >60 01/29/2017 0835   GFRAA >60 01/29/2017 0835    Lab Results  Component Value Date   CEA1 18.04 (H) 03/29/2017     Medications: I have reviewed the patient's current medications.  Assessment/Plan: 1. Rectal cancer  MRI lumbar spine 10/20/2016 with bilateral hydronephrosis and retroperitoneal  adenopathy  CT abdomen/pelvis 10/20/2016 with extensive retroperitoneal process with marked interstitial thickening and lymphadenopathy; bilateral hydronephrosis; similar ill-defined soft tissue density and skin thickening around the umbilicus; diffuse bladder wall thickening slightly asymmetric at the bladder dome but no obvious bladder mass  Biopsy para-aortic lymph node 11/10/2016-metastatic adenocarcinoma consistent with GI primary  11/23/2016 CEA elevated, CA-19-9 normal  Colonoscopy 11/27/2016-fungating infiltrative nonobstructing mass in the distal rectum involving the anal verge. The rectum was fixed and frozen in the pelvis precluding passage of scope beyond the distal sigmoid colon. 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  Foundation 1-microsatellite stable; tumor mutational burden 5; NOTCH3amplification; NOTCH3rearrangement exon 14  Upper endoscopy 11/27/2016-normal.  PET scan 06/22/2018showed minimal activity in the primary rectal carcinoma, diffuse wall thickening; mild activity associate with retroperitoneal lymph nodes; persistent haziness within the retroperitoneum. No evidence of liver metastasis or distant metastasis.  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  Cycle 2 FOLFOX 12/20/2016  Cycle 3 FOLFOX 01/03/2017 (oxaliplatin dose reduced secondary to thrombocytopenia)   Cycle 4 FOLFOX 01/17/2017 (oxaliplatin held due to thrombocytopenia)  Cycle 5 FOLFOX 02/15/2017  CTs 02/26/2017-new sclerotic lesions in the spine and right iliac, no residual adenopathy, persistent rectal wall thickening  Cycle 6 FOLFOX 03/01/2017 (Neulasta added, oxaliplatin further dose reduced)   Cycle 7 FOLFOX 03/15/2017 (  oxaliplatin held secondary to thrombocytopenia)  Cycle 8  FOLFOX 03/29/2017  Cycle 9 FOLFOX 04/12/2017 (oxaliplatin held secondary to thrombocytopenia and neuropathy) 2. Retroperitoneal lymphadenopathy secondary to #1 3. Bilateral hydronephrosis status post evaluation by urology 4. Asymmetry of the bladder dome on CT 10/20/2016 status post cystoscopy with biopsy planned 12/11/2016 (Dr. 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 Change in bowel habits, secondary to #1-improved. 6. Right leg edema, question due to lymphatic obstruction;venous Doppler negative for DVT 11/23/2016. 7. Port-A-Cath placement 12/05/2016 8. Thrombocytopenia secondary to chemotherapy; oxaliplatin dose reduced with cycle 3 FOLFOX. Progressive thrombocytopenia 01/17/2017; oxaliplatin held 01/17/2017. 9. Right lower extremity cellulitis 01/27/2017-blood culture positive for methicillin sensitive staph aureus; Port-A-Cath removed. PICC line placed. TEE 01/30/2017 without evidence of vegetation. 14 days of Ancef recommended after Port-A-Cath removal with end date 02/14/2017. 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  He has increased neuropathy symptoms.  He again has thrombocytopenia.  We decided to hold oxaliplatin secondary to the neuropathy and thrombocytopenia.  He will complete a cycle of 5-fluorouracil today.  The CEA has been slightly higher over the past month.  We will plan for a restaging CT evaluation after the next cycle of chemotherapy.  25 minutes were spent with the patient today.  The majority of the time was used for counseling and coordination of care.  Donneta Romberg, MD  04/12/2017  9:32 AM

## 2017-04-12 NOTE — Patient Instructions (Signed)
[facility] Discharge Instructions for Patients Receiving Chemotherapy  Today you received the following chemotherapy agents Leucovorin and 5FU  To help prevent nausea and vomiting after your treatment, we encourage you to take your nausea medication as directed If you develop nausea and vomiting that is not controlled by your nausea medication, call the clinic.   BELOW ARE SYMPTOMS THAT SHOULD BE REPORTED IMMEDIATELY:  *FEVER GREATER THAN 100.5 F  *CHILLS WITH OR WITHOUT FEVER  NAUSEA AND VOMITING THAT IS NOT CONTROLLED WITH YOUR NAUSEA MEDICATION  *UNUSUAL SHORTNESS OF BREATH  *UNUSUAL BRUISING OR BLEEDING  TENDERNESS IN MOUTH AND THROAT WITH OR WITHOUT PRESENCE OF ULCERS  *URINARY PROBLEMS  *BOWEL PROBLEMS  UNUSUAL RASH Items with * indicate a potential emergency and should be followed up as soon as possible.  Feel free to call the clinic should you have any questions or concerns. The clinic phone number is [phone].  Please show the CHEMO ALERT CARD at check-in to the Emergency Department and triage nurse.   

## 2017-04-14 ENCOUNTER — Ambulatory Visit (HOSPITAL_BASED_OUTPATIENT_CLINIC_OR_DEPARTMENT_OTHER): Payer: Federal, State, Local not specified - PPO

## 2017-04-14 VITALS — BP 148/80 | HR 84 | Temp 97.6°F | Resp 17

## 2017-04-14 DIAGNOSIS — C20 Malignant neoplasm of rectum: Secondary | ICD-10-CM | POA: Diagnosis not present

## 2017-04-14 MED ORDER — HEPARIN SOD (PORK) LOCK FLUSH 100 UNIT/ML IV SOLN
500.0000 [IU] | Freq: Once | INTRAVENOUS | Status: AC | PRN
  Administered 2017-04-14: 500 [IU]
  Filled 2017-04-14: qty 5

## 2017-04-14 MED ORDER — SODIUM CHLORIDE 0.9% FLUSH
10.0000 mL | INTRAVENOUS | Status: DC | PRN
  Administered 2017-04-14: 10 mL
  Filled 2017-04-14: qty 10

## 2017-04-14 NOTE — Patient Instructions (Signed)
Implanted Port Home Guide An implanted port is a type of central line that is placed under the skin. Central lines are used to provide IV access when treatment or nutrition needs to be given through a person's veins. Implanted ports are used for long-term IV access. An implanted port may be placed because:  You need IV medicine that would be irritating to the small veins in your hands or arms.  You need long-term IV medicines, such as antibiotics.  You need IV nutrition for a long period.  You need frequent blood draws for lab tests.  You need dialysis.  Implanted ports are usually placed in the chest area, but they can also be placed in the upper arm, the abdomen, or the leg. An implanted port has two main parts:  Reservoir. The reservoir is round and will appear as a small, raised area under your skin. The reservoir is the part where a needle is inserted to give medicines or draw blood.  Catheter. The catheter is a thin, flexible tube that extends from the reservoir. The catheter is placed into a large vein. Medicine that is inserted into the reservoir goes into the catheter and then into the vein.  How will I care for my incision site? Do not get the incision site wet. Bathe or shower as directed by your health care provider. How is my port accessed? Special steps must be taken to access the port:  Before the port is accessed, a numbing cream can be placed on the skin. This helps numb the skin over the port site.  Your health care provider uses a sterile technique to access the port. ? Your health care provider must put on a mask and sterile gloves. ? The skin over your port is cleaned carefully with an antiseptic and allowed to dry. ? The port is gently pinched between sterile gloves, and a needle is inserted into the port.  Only "non-coring" port needles should be used to access the port. Once the port is accessed, a blood return should be checked. This helps ensure that the port  is in the vein and is not clogged.  If your port needs to remain accessed for a constant infusion, a clear (transparent) bandage will be placed over the needle site. The bandage and needle will need to be changed every week, or as directed by your health care provider.  Keep the bandage covering the needle clean and dry. Do not get it wet. Follow your health care provider's instructions on how to take a shower or bath while the port is accessed.  If your port does not need to stay accessed, no bandage is needed over the port.  What is flushing? Flushing helps keep the port from getting clogged. Follow your health care provider's instructions on how and when to flush the port. Ports are usually flushed with saline solution or a medicine called heparin. The need for flushing will depend on how the port is used.  If the port is used for intermittent medicines or blood draws, the port will need to be flushed: ? After medicines have been given. ? After blood has been drawn. ? As part of routine maintenance.  If a constant infusion is running, the port may not need to be flushed.  How long will my port stay implanted? The port can stay in for as long as your health care provider thinks it is needed. When it is time for the port to come out, surgery will be   done to remove it. The procedure is similar to the one performed when the port was put in. When should I seek immediate medical care? When you have an implanted port, you should seek immediate medical care if:  You notice a bad smell coming from the incision site.  You have swelling, redness, or drainage at the incision site.  You have more swelling or pain at the port site or the surrounding area.  You have a fever that is not controlled with medicine.  This information is not intended to replace advice given to you by your health care provider. Make sure you discuss any questions you have with your health care provider. Document  Released: 05/29/2005 Document Revised: 11/04/2015 Document Reviewed: 02/03/2013 Elsevier Interactive Patient Education  2017 Elsevier Inc.  

## 2017-04-18 ENCOUNTER — Ambulatory Visit (HOSPITAL_BASED_OUTPATIENT_CLINIC_OR_DEPARTMENT_OTHER): Payer: Federal, State, Local not specified - PPO

## 2017-04-18 DIAGNOSIS — C674 Malignant neoplasm of posterior wall of bladder: Secondary | ICD-10-CM

## 2017-04-18 DIAGNOSIS — C770 Secondary and unspecified malignant neoplasm of lymph nodes of head, face and neck: Secondary | ICD-10-CM | POA: Diagnosis not present

## 2017-04-18 DIAGNOSIS — Z452 Encounter for adjustment and management of vascular access device: Secondary | ICD-10-CM

## 2017-04-18 DIAGNOSIS — C20 Malignant neoplasm of rectum: Secondary | ICD-10-CM

## 2017-04-18 DIAGNOSIS — Z95828 Presence of other vascular implants and grafts: Secondary | ICD-10-CM

## 2017-04-18 MED ORDER — HEPARIN SOD (PORK) LOCK FLUSH 100 UNIT/ML IV SOLN
500.0000 [IU] | Freq: Once | INTRAVENOUS | Status: AC
  Administered 2017-04-18: 250 [IU]
  Filled 2017-04-18: qty 5

## 2017-04-18 MED ORDER — SODIUM CHLORIDE 0.9% FLUSH
10.0000 mL | Freq: Once | INTRAVENOUS | Status: AC
  Administered 2017-04-18: 10 mL
  Filled 2017-04-18: qty 10

## 2017-04-22 ENCOUNTER — Other Ambulatory Visit: Payer: Self-pay | Admitting: Oncology

## 2017-04-25 ENCOUNTER — Other Ambulatory Visit (HOSPITAL_BASED_OUTPATIENT_CLINIC_OR_DEPARTMENT_OTHER): Payer: Federal, State, Local not specified - PPO

## 2017-04-25 ENCOUNTER — Telehealth: Payer: Self-pay | Admitting: Oncology

## 2017-04-25 ENCOUNTER — Ambulatory Visit (HOSPITAL_BASED_OUTPATIENT_CLINIC_OR_DEPARTMENT_OTHER): Payer: Federal, State, Local not specified - PPO | Admitting: Oncology

## 2017-04-25 ENCOUNTER — Ambulatory Visit: Payer: Federal, State, Local not specified - PPO

## 2017-04-25 ENCOUNTER — Ambulatory Visit (HOSPITAL_BASED_OUTPATIENT_CLINIC_OR_DEPARTMENT_OTHER): Payer: Federal, State, Local not specified - PPO

## 2017-04-25 VITALS — BP 127/80 | HR 68 | Temp 98.0°F | Resp 18 | Ht 75.0 in | Wt 211.5 lb

## 2017-04-25 DIAGNOSIS — C770 Secondary and unspecified malignant neoplasm of lymph nodes of head, face and neck: Secondary | ICD-10-CM

## 2017-04-25 DIAGNOSIS — C20 Malignant neoplasm of rectum: Secondary | ICD-10-CM

## 2017-04-25 DIAGNOSIS — C674 Malignant neoplasm of posterior wall of bladder: Secondary | ICD-10-CM | POA: Diagnosis not present

## 2017-04-25 DIAGNOSIS — Z5111 Encounter for antineoplastic chemotherapy: Secondary | ICD-10-CM | POA: Diagnosis not present

## 2017-04-25 DIAGNOSIS — Z95828 Presence of other vascular implants and grafts: Secondary | ICD-10-CM

## 2017-04-25 DIAGNOSIS — G620 Drug-induced polyneuropathy: Secondary | ICD-10-CM | POA: Diagnosis not present

## 2017-04-25 DIAGNOSIS — R609 Edema, unspecified: Secondary | ICD-10-CM | POA: Diagnosis not present

## 2017-04-25 LAB — COMPREHENSIVE METABOLIC PANEL
ALBUMIN: 3.4 g/dL — AB (ref 3.5–5.0)
ALT: 33 U/L (ref 0–55)
AST: 39 U/L — ABNORMAL HIGH (ref 5–34)
Alkaline Phosphatase: 87 U/L (ref 40–150)
Anion Gap: 4 mEq/L (ref 3–11)
BILIRUBIN TOTAL: 0.48 mg/dL (ref 0.20–1.20)
BUN: 11.4 mg/dL (ref 7.0–26.0)
CO2: 26 mEq/L (ref 22–29)
CREATININE: 0.9 mg/dL (ref 0.7–1.3)
Calcium: 10.1 mg/dL (ref 8.4–10.4)
Chloride: 109 mEq/L (ref 98–109)
GLUCOSE: 92 mg/dL (ref 70–140)
Potassium: 4.1 mEq/L (ref 3.5–5.1)
SODIUM: 139 meq/L (ref 136–145)
TOTAL PROTEIN: 6.3 g/dL — AB (ref 6.4–8.3)

## 2017-04-25 LAB — CBC WITH DIFFERENTIAL/PLATELET
BASO%: 1 % (ref 0.0–2.0)
Basophils Absolute: 0 10*3/uL (ref 0.0–0.1)
EOS ABS: 0.3 10*3/uL (ref 0.0–0.5)
EOS%: 10.2 % — ABNORMAL HIGH (ref 0.0–7.0)
HCT: 38.3 % — ABNORMAL LOW (ref 38.4–49.9)
HEMOGLOBIN: 12.7 g/dL — AB (ref 13.0–17.1)
LYMPH%: 30.8 % (ref 14.0–49.0)
MCH: 32.8 pg (ref 27.2–33.4)
MCHC: 33.3 g/dL (ref 32.0–36.0)
MCV: 98.7 fL — AB (ref 79.3–98.0)
MONO#: 0.5 10*3/uL (ref 0.1–0.9)
MONO%: 14.9 % — AB (ref 0.0–14.0)
NEUT%: 43.1 % (ref 39.0–75.0)
NEUTROS ABS: 1.4 10*3/uL — AB (ref 1.5–6.5)
Platelets: 124 10*3/uL — ABNORMAL LOW (ref 140–400)
RBC: 3.88 10*6/uL — ABNORMAL LOW (ref 4.20–5.82)
RDW: 16.3 % — AB (ref 11.0–14.6)
WBC: 3.3 10*3/uL — AB (ref 4.0–10.3)
lymph#: 1 10*3/uL (ref 0.9–3.3)

## 2017-04-25 LAB — CEA (IN HOUSE-CHCC): CEA (CHCC-In House): 25.23 ng/mL — ABNORMAL HIGH (ref 0.00–5.00)

## 2017-04-25 MED ORDER — HEPARIN SOD (PORK) LOCK FLUSH 100 UNIT/ML IV SOLN
500.0000 [IU] | Freq: Once | INTRAVENOUS | Status: DC | PRN
  Filled 2017-04-25: qty 5

## 2017-04-25 MED ORDER — HEPARIN SOD (PORK) LOCK FLUSH 100 UNIT/ML IV SOLN
250.0000 [IU] | Freq: Once | INTRAVENOUS | Status: AC
  Administered 2017-04-25: 250 [IU]
  Filled 2017-04-25: qty 5

## 2017-04-25 MED ORDER — SODIUM CHLORIDE 0.9 % IV SOLN
INTRAVENOUS | Status: DC
  Administered 2017-04-25: 11:00:00 via INTRAVENOUS

## 2017-04-25 MED ORDER — LEUCOVORIN CALCIUM INJECTION 350 MG
400.0000 mg/m2 | Freq: Once | INTRAVENOUS | Status: AC
  Administered 2017-04-25: 892 mg via INTRAVENOUS
  Filled 2017-04-25: qty 44.6

## 2017-04-25 MED ORDER — SODIUM CHLORIDE 0.9 % IV SOLN
2400.0000 mg/m2 | INTRAVENOUS | Status: DC
  Administered 2017-04-25: 5350 mg via INTRAVENOUS
  Filled 2017-04-25: qty 107

## 2017-04-25 MED ORDER — SODIUM CHLORIDE 0.9% FLUSH
10.0000 mL | INTRAVENOUS | Status: DC | PRN
  Administered 2017-04-25: 10 mL
  Filled 2017-04-25: qty 10

## 2017-04-25 MED ORDER — FLUOROURACIL CHEMO INJECTION 2.5 GM/50ML
400.0000 mg/m2 | Freq: Once | INTRAVENOUS | Status: AC
Start: 2017-04-25 — End: 2017-04-25
  Administered 2017-04-25: 900 mg via INTRAVENOUS
  Filled 2017-04-25: qty 18

## 2017-04-25 MED ORDER — SODIUM CHLORIDE 0.9% FLUSH
10.0000 mL | Freq: Once | INTRAVENOUS | Status: AC
  Administered 2017-04-25: 10 mL
  Filled 2017-04-25: qty 10

## 2017-04-25 NOTE — Telephone Encounter (Signed)
Scheduled appt per 11/14 los - Gave patient AVS and calender per los. - Central radiology to contact patient with ct schedule.

## 2017-04-25 NOTE — Progress Notes (Signed)
[facility] OFF[facility] PROGRESS NOTE   Diagnosis: Rectal cancer  INTERVAL HISTORY:   Nicholas Suarez returns as scheduled.  He completed a cycle of 5-fluorouracil 03/29/2017.  He reports a single episode of diarrhea last week.  He has a persistent ulcer at the left lower buccal mucosa.  He has noted increased numbness in the extremities.  This does not interfere with activity.  He has increased edema in the right leg with prolonged ambulation.  Objective:  Vital signs in last 24 hours:  Blood pressure 127/80, pulse 68, temperature 98 F (36.7 C), temperature source Oral, resp. rate 18, height '6\' 3"'  (1.905 m), weight 211 lb 8 oz (95.9 kg), SpO2 100 %.    HEENT: Ulcer at the lower left buccal mucosa Resp: Lungs clear bilaterally Cardio: Regular rate and rhythm GI: No hepatomegaly, nontender Vascular: 1+ edema at the right lower leg Neuro: Moderate to severe loss of vibratory sense at the fingertips bilaterally    Portacath/PICC-without erythema  Lab Results:  Lab Results  Component Value Date   WBC 3.3 (L) 04/25/2017   HGB 12.7 (L) 04/25/2017   HCT 38.3 (L) 04/25/2017   MCV 98.7 (H) 04/25/2017   PLT 124 (L) 04/25/2017   NEUTROABS 1.4 (L) 04/25/2017    CMP     Component Value Date/Time   NA 138 04/12/2017 0803   K 4.1 04/12/2017 0803   CL 108 01/29/2017 0835   CO2 24 04/12/2017 0803   GLUCOSE 133 04/12/2017 0803   BUN 15.4 04/12/2017 0803   CREATININE 0.9 04/12/2017 0803   CALCIUM 9.8 04/12/2017 0803   PROT 5.9 (L) 04/12/2017 0803   ALBUMIN 3.4 (L) 04/12/2017 0803   AST 35 (H) 04/12/2017 0803   ALT 29 04/12/2017 0803   ALKPHOS 123 04/12/2017 0803   BILITOT 0.55 04/12/2017 0803   GFRNONAA >60 01/29/2017 0835   GFRAA >60 01/29/2017 0835    Lab Results  Component Value Date   CEA1 18.04 (H) 03/29/2017     Medications: I have reviewed the patient's current medications.  Assessment/Plan: 1. Rectal cancer  MRI lumbar spine 10/20/2016 with  bilateral hydronephrosis and retroperitoneal adenopathy  CT abdomen/pelvis 10/20/2016 with extensive retroperitoneal process with marked interstitial thickening and lymphadenopathy; bilateral hydronephrosis; similar ill-defined soft tissue density and skin thickening around the umbilicus; diffuse bladder wall thickening slightly asymmetric at the bladder dome but no obvious bladder mass  Biopsy para-aortic lymph node 11/10/2016-metastatic adenocarcinoma consistent with GI primary  11/23/2016 CEA elevated, CA-19-9 normal  Colonoscopy 11/27/2016-fungating infiltrative nonobstructing mass in the distal rectum involving the anal verge. The rectum was fixed and frozen in the pelvis precluding passage of scope beyond the distal sigmoid colon. 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  Foundation 1-microsatellite stable; tumor mutational burden 5; NOTCH3amplification; NOTCH3rearrangement exon 14  Upper endoscopy 11/27/2016-normal.  PET scan 06/22/2018showed minimal activity in the primary rectal carcinoma, diffuse wall thickening; mild activity associate with retroperitoneal lymph nodes; persistent haziness within the retroperitoneum. No evidence of liver metastasis or distant metastasis.  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  Cycle 2 FOLFOX 12/20/2016  Cycle 3 FOLFOX 01/03/2017 (oxaliplatin dose reduced secondary to thrombocytopenia)   Cycle 4 FOLFOX 01/17/2017 (oxaliplatin held due to thrombocytopenia)  Cycle 5 FOLFOX 02/15/2017  CTs 02/26/2017-new sclerotic lesions in the spine and right iliac, no residual adenopathy, persistent rectal wall thickening  Cycle 6  FOLFOX 03/01/2017 (Neulasta added, oxaliplatin further dose reduced)   Cycle 7 FOLFOX 03/15/2017 (oxaliplatin held  secondary to thrombocytopenia)  Cycle 8 FOLFOX 03/29/2017  Cycle 9 FOLFOX 04/12/2017 (oxaliplatin held secondary to thrombocytopenia and neuropathy)  Cycle 10 FOLFOX 04/25/2017 (oxaliplatin held secondary to neuropathy) 2. Retroperitoneal lymphadenopathy secondary to #1 3. Bilateral hydronephrosis status post evaluation by urology 4. Asymmetry of the bladder dome on CT 10/20/2016 status post cystoscopy with biopsy planned 12/11/2016 (Dr. 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 Change in bowel habits, secondary to #1-improved. 6. Right leg edema, question due to lymphatic obstruction;venous Doppler negative for DVT 11/23/2016. 7. Port-A-Cath placement 12/05/2016 8. Thrombocytopenia secondary to chemotherapy; oxaliplatin dose reduced with cycle 3 FOLFOX. Progressive thrombocytopenia 01/17/2017; oxaliplatin held 01/17/2017. 9. Right lower extremity cellulitis 01/27/2017-blood culture positive for methicillin sensitive staph aureus; Port-A-Cath removed. PICC line placed. TEE 01/30/2017 without evidence of vegetation. 14 days of Ancef recommended after Port-A-Cath removal with end date 02/14/2017. 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  He has developed progressive oxaliplatin neuropathy.  We decided to hold oxaliplatin with cycle 10 today.  He will undergo a restaging CT evaluation after this cycle.  The PICC will remain in place until he returns for an office visit in 2 weeks.  We plan a change to maintenance Xeloda if the CT review shows stable or improved disease.  25 minutes were spent with the patient today.  The majority of the time was used for counseling and coordination of care.  Betsy Coder, MD  04/25/2017  9:22 AM

## 2017-04-25 NOTE — Patient Instructions (Signed)
[facility] Discharge Instructions for Patients Receiving Chemotherapy  Today you received the following chemotherapy agents Leucovorin and 5FU  To help prevent nausea and vomiting after your treatment, we encourage you to take your nausea medication as directed If you develop nausea and vomiting that is not controlled by your nausea medication, call the clinic.   BELOW ARE SYMPTOMS THAT SHOULD BE REPORTED IMMEDIATELY:  *FEVER GREATER THAN 100.5 F  *CHILLS WITH OR WITHOUT FEVER  NAUSEA AND VOMITING THAT IS NOT CONTROLLED WITH YOUR NAUSEA MEDICATION  *UNUSUAL SHORTNESS OF BREATH  *UNUSUAL BRUISING OR BLEEDING  TENDERNESS IN MOUTH AND THROAT WITH OR WITHOUT PRESENCE OF ULCERS  *URINARY PROBLEMS  *BOWEL PROBLEMS  UNUSUAL RASH Items with * indicate a potential emergency and should be followed up as soon as possible.  Feel free to call the clinic should you have any questions or concerns. The clinic phone number is [phone].  Please show the CHEMO ALERT CARD at check-in to the Emergency Department and triage nurse.   

## 2017-04-25 NOTE — Progress Notes (Signed)
Dr. Benay Spice okay to tx with ANC 1.4. Oxaliplatin to be removed from tx plan.

## 2017-04-27 ENCOUNTER — Ambulatory Visit (HOSPITAL_BASED_OUTPATIENT_CLINIC_OR_DEPARTMENT_OTHER): Payer: Federal, State, Local not specified - PPO

## 2017-04-27 VITALS — BP 126/80 | HR 72 | Temp 98.0°F | Resp 18

## 2017-04-27 DIAGNOSIS — Z452 Encounter for adjustment and management of vascular access device: Secondary | ICD-10-CM | POA: Diagnosis not present

## 2017-04-27 DIAGNOSIS — C674 Malignant neoplasm of posterior wall of bladder: Secondary | ICD-10-CM | POA: Diagnosis not present

## 2017-04-27 DIAGNOSIS — C20 Malignant neoplasm of rectum: Secondary | ICD-10-CM

## 2017-04-27 DIAGNOSIS — C770 Secondary and unspecified malignant neoplasm of lymph nodes of head, face and neck: Secondary | ICD-10-CM

## 2017-04-27 MED ORDER — SODIUM CHLORIDE 0.9% FLUSH
10.0000 mL | INTRAVENOUS | Status: DC | PRN
  Administered 2017-04-27: 10 mL
  Filled 2017-04-27: qty 10

## 2017-04-27 MED ORDER — HEPARIN SOD (PORK) LOCK FLUSH 100 UNIT/ML IV SOLN
500.0000 [IU] | Freq: Once | INTRAVENOUS | Status: AC | PRN
  Administered 2017-04-27: 500 [IU]
  Filled 2017-04-27: qty 5

## 2017-05-02 ENCOUNTER — Ambulatory Visit (HOSPITAL_BASED_OUTPATIENT_CLINIC_OR_DEPARTMENT_OTHER): Payer: Federal, State, Local not specified - PPO

## 2017-05-02 DIAGNOSIS — Z452 Encounter for adjustment and management of vascular access device: Secondary | ICD-10-CM

## 2017-05-02 DIAGNOSIS — C20 Malignant neoplasm of rectum: Secondary | ICD-10-CM

## 2017-05-02 DIAGNOSIS — C674 Malignant neoplasm of posterior wall of bladder: Secondary | ICD-10-CM

## 2017-05-02 DIAGNOSIS — C770 Secondary and unspecified malignant neoplasm of lymph nodes of head, face and neck: Secondary | ICD-10-CM | POA: Diagnosis not present

## 2017-05-02 DIAGNOSIS — Z95828 Presence of other vascular implants and grafts: Secondary | ICD-10-CM

## 2017-05-02 MED ORDER — HEPARIN SOD (PORK) LOCK FLUSH 100 UNIT/ML IV SOLN
250.0000 [IU] | Freq: Once | INTRAVENOUS | Status: AC
  Administered 2017-05-02: 250 [IU]
  Filled 2017-05-02: qty 5

## 2017-05-02 MED ORDER — SODIUM CHLORIDE 0.9% FLUSH
10.0000 mL | Freq: Once | INTRAVENOUS | Status: AC
  Administered 2017-05-02: 10 mL
  Filled 2017-05-02: qty 10

## 2017-05-06 ENCOUNTER — Other Ambulatory Visit: Payer: Self-pay | Admitting: Oncology

## 2017-05-07 ENCOUNTER — Ambulatory Visit (HOSPITAL_COMMUNITY)
Admission: RE | Admit: 2017-05-07 | Discharge: 2017-05-07 | Disposition: A | Discharge status: Home / Self Care | Payer: Federal, State, Local not specified - PPO | Source: Ambulatory Visit | Attending: Oncology | Admitting: Oncology

## 2017-05-07 DIAGNOSIS — R918 Other nonspecific abnormal finding of lung field: Secondary | ICD-10-CM | POA: Insufficient documentation

## 2017-05-07 DIAGNOSIS — C20 Malignant neoplasm of rectum: Secondary | ICD-10-CM | POA: Insufficient documentation

## 2017-05-07 MED ORDER — HEPARIN SOD (PORK) LOCK FLUSH 10 UNIT/ML IV SOLN
500.0000 [IU] | Freq: Once | INTRAVENOUS | Status: AC
  Administered 2017-05-07: 500 [IU] via INTRAVENOUS

## 2017-05-07 MED ORDER — IOPAMIDOL (ISOVUE-300) INJECTION 61%
100.0000 mL | Freq: Once | INTRAVENOUS | Status: AC | PRN
  Administered 2017-05-07: 100 mL via INTRAVENOUS

## 2017-05-07 MED ORDER — HEPARIN SOD (PORK) LOCK FLUSH 100 UNIT/ML IV SOLN
INTRAVENOUS | Status: AC
  Filled 2017-05-07: qty 5

## 2017-05-07 MED ORDER — IOPAMIDOL (ISOVUE-300) INJECTION 61%
INTRAVENOUS | Status: AC
  Filled 2017-05-07: qty 100

## 2017-05-09 ENCOUNTER — Other Ambulatory Visit (HOSPITAL_BASED_OUTPATIENT_CLINIC_OR_DEPARTMENT_OTHER): Payer: Federal, State, Local not specified - PPO

## 2017-05-09 ENCOUNTER — Ambulatory Visit (HOSPITAL_BASED_OUTPATIENT_CLINIC_OR_DEPARTMENT_OTHER): Payer: Federal, State, Local not specified - PPO | Admitting: Oncology

## 2017-05-09 ENCOUNTER — Telehealth: Payer: Self-pay | Admitting: Oncology

## 2017-05-09 VITALS — BP 124/96 | HR 70 | Temp 97.7°F | Resp 20 | Ht 75.0 in | Wt 214.1 lb

## 2017-05-09 DIAGNOSIS — C770 Secondary and unspecified malignant neoplasm of lymph nodes of head, face and neck: Secondary | ICD-10-CM

## 2017-05-09 DIAGNOSIS — R609 Edema, unspecified: Secondary | ICD-10-CM | POA: Diagnosis not present

## 2017-05-09 DIAGNOSIS — C20 Malignant neoplasm of rectum: Secondary | ICD-10-CM

## 2017-05-09 DIAGNOSIS — C674 Malignant neoplasm of posterior wall of bladder: Secondary | ICD-10-CM

## 2017-05-09 DIAGNOSIS — R194 Change in bowel habit: Secondary | ICD-10-CM

## 2017-05-09 LAB — COMPREHENSIVE METABOLIC PANEL
ALBUMIN: 3.4 g/dL — AB (ref 3.5–5.0)
ALK PHOS: 99 U/L (ref 40–150)
ALT: 30 U/L (ref 0–55)
ANION GAP: 5 meq/L (ref 3–11)
AST: 35 U/L — AB (ref 5–34)
BILIRUBIN TOTAL: 0.49 mg/dL (ref 0.20–1.20)
BUN: 11 mg/dL (ref 7.0–26.0)
CALCIUM: 10.2 mg/dL (ref 8.4–10.4)
CHLORIDE: 109 meq/L (ref 98–109)
CO2: 27 mEq/L (ref 22–29)
CREATININE: 1 mg/dL (ref 0.7–1.3)
EGFR: >60 mL/min/{1.73_m2} (ref >60)
Glucose: 94 mg/dl (ref 70–140)
Potassium: 4.5 mEq/L (ref 3.5–5.1)
Sodium: 141 mEq/L (ref 136–145)
TOTAL PROTEIN: 6.3 g/dL — AB (ref 6.4–8.3)

## 2017-05-09 LAB — CBC WITH DIFFERENTIAL/PLATELET
BASO%: 1.9 % (ref 0.0–2.0)
Basophils Absolute: 0.1 10*3/uL (ref 0.0–0.1)
EOS%: 11.7 % — ABNORMAL HIGH (ref 0.0–7.0)
Eosinophils Absolute: 0.4 10*3/uL (ref 0.0–0.5)
HCT: 38.1 % — ABNORMAL LOW (ref 38.4–49.9)
HGB: 12.8 g/dL — ABNORMAL LOW (ref 13.0–17.1)
LYMPH%: 28.2 % (ref 14.0–49.0)
MCH: 33.1 pg (ref 27.2–33.4)
MCHC: 33.5 g/dL (ref 32.0–36.0)
MCV: 98.8 fL — ABNORMAL HIGH (ref 79.3–98.0)
MONO#: 0.4 10*3/uL (ref 0.1–0.9)
MONO%: 12.1 % (ref 0.0–14.0)
NEUT%: 46.1 % (ref 39.0–75.0)
NEUTROS ABS: 1.6 10*3/uL (ref 1.5–6.5)
Platelets: 106 10*3/uL — ABNORMAL LOW (ref 140–400)
RBC: 3.85 10*6/uL — AB (ref 4.20–5.82)
RDW: 15.8 % — ABNORMAL HIGH (ref 11.0–14.6)
WBC: 3.5 10*3/uL — AB (ref 4.0–10.3)
lymph#: 1 10*3/uL (ref 0.9–3.3)

## 2017-05-09 LAB — CEA (IN HOUSE-CHCC): CEA (CHCC-IN HOUSE): 33.52 ng/mL — AB (ref 0.00–5.00)

## 2017-05-09 NOTE — Telephone Encounter (Signed)
Gave avs and calendar for December  °

## 2017-05-09 NOTE — Progress Notes (Signed)
PICC line d/c'd w/o incident. Pt tolerated well. No unusual bleeding. VSS. Stayed in supine position x 30 min

## 2017-05-09 NOTE — Progress Notes (Signed)
[place] OFFICE PROGRESS NOTE   Diagnosis: Rectal cancer  INTERVAL HISTORY:   Nicholas Suarez returns as scheduled. He completed another cycle of 5-fluorouracil on 04/25/2017. He has persistent neuropathy symptoms. This does not interfere with activity. He has noted improvement in bowel habits since beginning chemotherapy. He has developed mild edema at the left ankle. No other complaint. Objective:  Vital signs in last 24 hours:  Blood pressure 129/79, pulse 68, temperature 97.7 F (36.5 C), temperature source Oral, resp. rate 20, height '6\' 3"'$  (1.905 m), weight 214 lb 1.6 oz (97.1 kg), SpO2 100 %.    HEENT: No thrush or ulcers Lymphatics: No cervical or supraclavicular nodes Resp: Lungs clear bilaterally Cardio: Regular rate and rhythm GI: No hepatosplenomegaly, no mass, nontender Vascular: Trace edema at the right greater than left lower leg and ankle    Portacath/PICC-without erythema  Lab Results:  Lab Results  Component Value Date   WBC 3.5 (L) 05/09/2017   HGB 12.8 (L) 05/09/2017   HCT 38.1 (L) 05/09/2017   MCV 98.8 (H) 05/09/2017   PLT 106 (L) 05/09/2017   NEUTROABS 1.6 05/09/2017    CMP     Component Value Date/Time   NA 141 05/09/2017 1115   K 4.5 05/09/2017 1115   CL 108 01/29/2017 0835   CO2 27 05/09/2017 1115   GLUCOSE 94 05/09/2017 1115   BUN 11.0 05/09/2017 1115   CREATININE 1.0 05/09/2017 1115   CALCIUM 10.2 05/09/2017 1115   PROT 6.3 (L) 05/09/2017 1115   ALBUMIN 3.4 (L) 05/09/2017 1115   AST 35 (H) 05/09/2017 1115   ALT 30 05/09/2017 1115   ALKPHOS 99 05/09/2017 1115   BILITOT 0.49 05/09/2017 1115   GFRNONAA >60 01/29/2017 0835   GFRAA >60 01/29/2017 0835    Lab Results  Component Value Date   CEA1 33.52 (H) 05/09/2017    Lab Results  Component Value Date   INR 0.96 01/31/2017    Imaging:  Ct Chest W Contrast  Result Date: 05/07/2017 CLINICAL DATA:  Rectal ca dx'd 10/2016. Ongoing IV chemo. Hx umbilical hernia  repair. 100 cc ISO 300^139m ISOVUE-300 IOPAMIDOL (ISOVUE-300) INJECTION 61% EXAM: CT CHEST, ABDOMEN, AND PELVIS WITH CONTRAST TECHNIQUE: Multidetector CT imaging of the chest, abdomen and pelvis was performed following the standard protocol during bolus administration of intravenous contrast. CONTRAST:  1032mISOVUE-300 IOPAMIDOL (ISOVUE-300) INJECTION 61% COMPARISON:  CT 02/26/2017 FINDINGS: CT CHEST FINDINGS Cardiovascular: No significant vascular findings. Normal heart size. No pericardial effusion. Mediastinum/Nodes: No axillary or supraclavicular adenopathy. No mediastinal hilar adenopathy no pericardial fluid Lungs/Pleura: No suspicious pulmonary nodules. Musculoskeletal: No aggressive osseous lesion. CT ABDOMEN AND PELVIS FINDINGS Hepatobiliary: No focal hepatic lesion. No biliary ductal dilatation. Gallbladder is normal. Common bile duct is normal. Pancreas: Pancreas is normal. No ductal dilatation. No pancreatic inflammation. Spleen: Normal spleen Adrenals/urinary tract: Adrenal glands and kidneys are normal. The ureters and bladder normal. Stomach/Bowel: Stomach, small-bowel appendix and cecum normal. Colon is normal. There is thickening through the distal rectum not changed. Vascular/Lymphatic: There is haziness in the retroperitoneal fat along the aorta. No lymphadenopathy. Atherosclerotic calcification aorta. Reproductive:  Normal prostate Other: Thickening at the umbilicus is similar comparison exam (image 91, series 2). Musculoskeletal: Subtle sclerotic lesion in the posterior aspect of the RIGHT 8 iliac bone and 2 small smudgy lesions within the L3 vertebral body are not changed. No new sclerotic lesions. IMPRESSION: Chest Impression: 1. No evidence of thoracic metastasis. Abdomen / Pelvis Impression: 1. No evidence of soft  tissue metastasis in the abdomen pelvis. 2. Stable thickened distal rectum. 3. Haziness in the retroperitoneum along the aorta likely related to recent surgery. 4. Stable nodular  thickening the umbilicus is indeterminate. 5. Subtle smudgy sclerotic lesions within the pelvis and lumbar spine not changed. Electronically Signed   By: Suzy Bouchard M.D.   On: 05/07/2017 17:20   Ct Abdomen Pelvis W Contrast  Result Date: 05/07/2017 CLINICAL DATA:  Rectal ca dx'd 10/2016. Ongoing IV chemo. Hx umbilical hernia repair. 100 cc ISO 300^15m ISOVUE-300 IOPAMIDOL (ISOVUE-300) INJECTION 61% EXAM: CT CHEST, ABDOMEN, AND PELVIS WITH CONTRAST TECHNIQUE: Multidetector CT imaging of the chest, abdomen and pelvis was performed following the standard protocol during bolus administration of intravenous contrast. CONTRAST:  1053mISOVUE-300 IOPAMIDOL (ISOVUE-300) INJECTION 61% COMPARISON:  CT 02/26/2017 FINDINGS: CT CHEST FINDINGS Cardiovascular: No significant vascular findings. Normal heart size. No pericardial effusion. Mediastinum/Nodes: No axillary or supraclavicular adenopathy. No mediastinal hilar adenopathy no pericardial fluid Lungs/Pleura: No suspicious pulmonary nodules. Musculoskeletal: No aggressive osseous lesion. CT ABDOMEN AND PELVIS FINDINGS Hepatobiliary: No focal hepatic lesion. No biliary ductal dilatation. Gallbladder is normal. Common bile duct is normal. Pancreas: Pancreas is normal. No ductal dilatation. No pancreatic inflammation. Spleen: Normal spleen Adrenals/urinary tract: Adrenal glands and kidneys are normal. The ureters and bladder normal. Stomach/Bowel: Stomach, small-bowel appendix and cecum normal. Colon is normal. There is thickening through the distal rectum not changed. Vascular/Lymphatic: There is haziness in the retroperitoneal fat along the aorta. No lymphadenopathy. Atherosclerotic calcification aorta. Reproductive:  Normal prostate Other: Thickening at the umbilicus is similar comparison exam (image 91, series 2). Musculoskeletal: Subtle sclerotic lesion in the posterior aspect of the RIGHT 8 iliac bone and 2 small smudgy lesions within the L3 vertebral body are  not changed. No new sclerotic lesions. IMPRESSION: Chest Impression: 1. No evidence of thoracic metastasis. Abdomen / Pelvis Impression: 1. No evidence of soft tissue metastasis in the abdomen pelvis. 2. Stable thickened distal rectum. 3. Haziness in the retroperitoneum along the aorta likely related to recent surgery. 4. Stable nodular thickening the umbilicus is indeterminate. 5. Subtle smudgy sclerotic lesions within the pelvis and lumbar spine not changed. Electronically Signed   By: StSuzy Bouchard.D.   On: 05/07/2017 17:20   CT images-reviewed Medications: I have reviewed the patient's current medications.  Assessment/Plan: 1. Rectal cancer  MRI lumbar spine 10/20/2016 with bilateral hydronephrosis and retroperitoneal adenopathy  CT abdomen/pelvis 10/20/2016 with extensive retroperitoneal process with marked interstitial thickening and lymphadenopathy; bilateral hydronephrosis; similar ill-defined soft tissue density and skin thickening around the umbilicus; diffuse bladder wall thickening slightly asymmetric at the bladder dome but no obvious bladder mass  Biopsy para-aortic lymph node 11/10/2016-metastatic adenocarcinoma consistent with GI primary  11/23/2016 CEA elevated, CA-19-9 normal  Colonoscopy 11/27/2016-fungating infiltrative nonobstructing mass in the distal rectum involving the anal verge. The rectum was fixed and frozen in the pelvis precluding passage of scope beyond the distal sigmoid colon. 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  Foundation 1-microsatellite stable; tumor mutational burden 5; NOTCH3amplification; NOTCH3rearrangement exon 14  Upper endoscopy 11/27/2016-normal.  PET scan 06/22/2018showed minimal activity in the primary rectal carcinoma, diffuse wall thickening; mild activity associate with retroperitoneal lymph nodes; persistent haziness within the retroperitoneum. No evidence of liver metastasis or distant metastasis.  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  Cycle 2 FOLFOX 12/20/2016  Cycle 3 FOLFOX 01/03/2017 (oxaliplatin  dose reduced secondary to thrombocytopenia)   Cycle 4 FOLFOX 01/17/2017 (oxaliplatin held due to thrombocytopenia)  Cycle 5 FOLFOX 02/15/2017  CTs 02/26/2017-new sclerotic lesions in the spine and right iliac, no residual adenopathy, persistent rectal wall thickening  Cycle 6 FOLFOX 03/01/2017 (Neulasta added, oxaliplatin further dose reduced)   Cycle 7 FOLFOX 03/15/2017 (oxaliplatin held secondary to thrombocytopenia)  Cycle 8 FOLFOX 03/29/2017  Cycle 9 FOLFOX 04/12/2017 (oxaliplatin held secondary to thrombocytopenia and neuropathy)  Cycle 10 FOLFOX 04/25/2017 (oxaliplatin held secondary to neuropathy)  CTs 05/07/2017-stable rectal thickening, stable periaortic soft tissue, stable sclerotic lesions at the lumbar spine and pelvis  Treatment changed to maintenance capecitabine beginning 05/14/2017 2. Retroperitoneal lymphadenopathy secondary to #1 3. Bilateral hydronephrosis status post evaluation by urology 4. Asymmetry of the bladder dome on CT 10/20/2016 status post cystoscopy with biopsy planned 12/11/2016 (Dr. 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 Change in bowel habits, secondary to #1-improved. 6. Right leg edema, question due to lymphatic obstruction;venous Doppler negative for DVT 11/23/2016. 7. Port-A-Cath placement 12/05/2016 8. Thrombocytopenia secondary to chemotherapy; oxaliplatin dose reduced with cycle 3 FOLFOX. Progressive thrombocytopenia 01/17/2017; oxaliplatin held 01/17/2017. 9. Right lower extremity cellulitis 01/27/2017-blood culture positive for methicillin sensitive staph  aureus; Port-A-Cath removed. PICC line placed. TEE 01/30/2017 without evidence of vegetation. 14 days of Ancef recommended after Port-A-Cath removal with end date 02/14/2017. 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 The restaging CT reveals no evidence of disease progression. I discussed treatment options with Nicholas Suarez and his family. I recommend switching to maintenance Xeloda. We reviewed the potential toxicities associated with Xeloda including the chance for mucositis, diarrhea, and skin toxicity. He agrees to proceed.  The plan is to begin maintenance Xeloda on a 1 week on/one-week off schedule on 05/14/2017. We will follow the CEA closely and consider a restaging PET for a consistent rise in the CEA.  25 minutes were spent with the patient today. The majority of the time was used for counseling and coordination of care. Betsy Coder, MD  05/09/2017  12:33 PM

## 2017-05-09 NOTE — Patient Instructions (Signed)
PICC Removal, Care After Refer to this sheet in the next few weeks. These instructions provide you with information on caring for yourself after your procedure. Your health care provider may also give you more specific instructions. Your treatment has been planned according to current medical practices, but problems sometimes occur. Call your health care provider if you have any problems or questions after your procedure. What can I expect after the procedure? After your procedure, it is typical to have mild discomfort at the insertion site. This should not last for more than a day. Follow these instructions at home: You may remove the bandage after 24 hours. The PICC insertion site is very small. A small scab may develop over the insertion site. It is okay to wash the site gently with soap and water. Be careful not to remove or pick off the scab. Gently pat the site dry after washing it. You do not need to put another bandage over the insertion site. Do not lift anything heavy or do strenuous physical activity for 24 hours after the PICC is removed. This includes:  Weight lifting.  Strenuous yard work.  Any physical activity with repetitive arm movement.  Contact a health care provider if:  You have swelling or puffiness in your arm at the PICC insertion site.  You have increasing tenderness at the PICC insertion site. Get help right away if:  You have numbness or tingling in your fingers, hand, or arm.  Your arm looks blue and feels cold.  You have redness around the insertion site or a red streak goes up your arm.  You have any type of drainage from the PICC insertion site. This includes drainage such as: ? Bleeding from the insertion site. If this happens, apply firm, direct pressure to the PICC insertion site with a clean towel. ? Drainage that is yellow or tan.  You have a fever. This information is not intended to replace advice given to you by your health care provider. Make  sure you discuss any questions you have with your health care provider. Document Released: 06/03/2013 Document Revised: 11/04/2015 Document Reviewed: 03/21/2013 Elsevier Interactive Patient Education  2017 [employer]  

## 2017-05-11 ENCOUNTER — Telehealth: Payer: Self-pay | Admitting: Pharmacist

## 2017-05-11 DIAGNOSIS — C20 Malignant neoplasm of rectum: Secondary | ICD-10-CM

## 2017-05-11 MED ORDER — CAPECITABINE 500 MG PO TABS: 2000.0000 mg | ORAL_TABLET | Freq: Two times a day (BID) | ORAL | 0 refills | Status: DC

## 2017-05-11 MED FILL — CAPECITABINE 500 MG TABLET: 500 | 28 days supply | Qty: 112 | Fill #0

## 2017-05-11 NOTE — Telephone Encounter (Signed)
Oral Chemotherapy Pharmacist Encounter   I spoke with patient for overview of: Xeloda.   Pt is doing well. Counseled patient on administration, dosing, side effects, monitoring, drug-food interactions, safe handling, storage, and disposal.  Patient will take Xeloda 500mg  tablets, 4 tablets (2000mg ) by mouth in AM and 4 tabs (2000mg ) by mouth in PM, within 30 minutes of finishing meals, on days 1-7 & 15-21 of each 28 day cycle.  Xeloda and oxaliplatin start date: 05/14/17  Side effects of Xeloda include but not limited to: fatigue, decreased blood counts, GI upset, diarrhea, and hand-foot syndrome. Patient has loperamide at home and will call the office if diarrhea develops.    Reviewed with patient importance of keeping a medication schedule and plan for any missed doses.  Mr. Sylla voiced understanding and appreciation.   All questions answered. Medication reconciliation performed and medication/allergy list updated.  Patient's Xeloda prescription will be ready at the [facility] on 05/14/17 after 2pm. He will pick it up on Monday and start Monday evening with his 1st dose of his 1st week on.   Patient knows to call the office with questions or concerns. Oral Oncology Clinic will continue to follow.  Thank you,  Johny Drilling, PharmD, BCPS, BCOP 05/11/2017   3:45 PM Oral Oncology Clinic [phone]

## 2017-05-11 NOTE — Telephone Encounter (Signed)
Oral Oncology Pharmacist Encounter  Received new prescription for Xeloda (capecitabine) for the maintenance treatment of metastatic rectal cancer, planned duration until disease progression or unacceptable toxicity.  Labs from 05/09/17 assessed, OK for treatment.  Current medication list in Epic reviewed, no DDIs with Xeloda identified.  Prescription has been e-scribed to the [facility] for benefits analysis and approval.  Oral Oncology Clinic will continue to follow for insurance authorization, copayment issues, initial counseling and start date.  Johny Drilling, PharmD, BCPS, BCOP 05/11/2017 3:15 PM Oral Oncology Clinic [phone]

## 2017-06-04 ENCOUNTER — Other Ambulatory Visit: Payer: Self-pay | Admitting: Oncology

## 2017-06-04 DIAGNOSIS — C20 Malignant neoplasm of rectum: Secondary | ICD-10-CM

## 2017-06-06 ENCOUNTER — Ambulatory Visit (HOSPITAL_BASED_OUTPATIENT_CLINIC_OR_DEPARTMENT_OTHER): Payer: Federal, State, Local not specified - PPO | Admitting: Oncology

## 2017-06-06 ENCOUNTER — Other Ambulatory Visit: Payer: Federal, State, Local not specified - PPO

## 2017-06-06 ENCOUNTER — Other Ambulatory Visit: Payer: Self-pay

## 2017-06-06 ENCOUNTER — Other Ambulatory Visit (HOSPITAL_BASED_OUTPATIENT_CLINIC_OR_DEPARTMENT_OTHER): Payer: Federal, State, Local not specified - PPO

## 2017-06-06 ENCOUNTER — Ambulatory Visit: Payer: Federal, State, Local not specified - PPO | Admitting: Oncology

## 2017-06-06 ENCOUNTER — Telehealth: Payer: Self-pay | Admitting: Oncology

## 2017-06-06 ENCOUNTER — Telehealth: Payer: Self-pay

## 2017-06-06 ENCOUNTER — Other Ambulatory Visit: Payer: Self-pay | Admitting: Oncology

## 2017-06-06 VITALS — BP 118/70 | HR 71 | Temp 98.0°F | Resp 20 | Ht 75.0 in | Wt 218.1 lb

## 2017-06-06 DIAGNOSIS — M25552 Pain in left hip: Secondary | ICD-10-CM

## 2017-06-06 DIAGNOSIS — C20 Malignant neoplasm of rectum: Secondary | ICD-10-CM

## 2017-06-06 DIAGNOSIS — M25512 Pain in left shoulder: Secondary | ICD-10-CM | POA: Diagnosis not present

## 2017-06-06 DIAGNOSIS — C674 Malignant neoplasm of posterior wall of bladder: Secondary | ICD-10-CM

## 2017-06-06 DIAGNOSIS — C770 Secondary and unspecified malignant neoplasm of lymph nodes of head, face and neck: Secondary | ICD-10-CM | POA: Diagnosis not present

## 2017-06-06 DIAGNOSIS — M25551 Pain in right hip: Secondary | ICD-10-CM | POA: Diagnosis not present

## 2017-06-06 DIAGNOSIS — M25511 Pain in right shoulder: Secondary | ICD-10-CM

## 2017-06-06 DIAGNOSIS — R200 Anesthesia of skin: Secondary | ICD-10-CM

## 2017-06-06 DIAGNOSIS — M25529 Pain in unspecified elbow: Secondary | ICD-10-CM | POA: Diagnosis not present

## 2017-06-06 LAB — CBC WITH DIFFERENTIAL/PLATELET
BASO%: 0.5 % (ref 0.0–2.0)
BASOS ABS: 0 10*3/uL (ref 0.0–0.1)
EOS ABS: 0.4 10*3/uL (ref 0.0–0.5)
EOS%: 5.9 % (ref 0.0–7.0)
HCT: 40.4 % (ref 38.4–49.9)
HGB: 13.6 g/dL (ref 13.0–17.1)
LYMPH%: 14.6 % (ref 14.0–49.0)
MCH: 33 pg (ref 27.2–33.4)
MCHC: 33.6 g/dL (ref 32.0–36.0)
MCV: 98.3 fL — AB (ref 79.3–98.0)
MONO#: 0.4 10*3/uL (ref 0.1–0.9)
MONO%: 6.2 % (ref 0.0–14.0)
NEUT#: 5.2 10*3/uL (ref 1.5–6.5)
NEUT%: 72.8 % (ref 39.0–75.0)
Platelets: 117 10*3/uL — ABNORMAL LOW (ref 140–400)
RBC: 4.11 10*6/uL — ABNORMAL LOW (ref 4.20–5.82)
RDW: 14.8 % — ABNORMAL HIGH (ref 11.0–14.6)
WBC: 7.1 10*3/uL (ref 4.0–10.3)
lymph#: 1 10*3/uL (ref 0.9–3.3)

## 2017-06-06 LAB — COMPREHENSIVE METABOLIC PANEL
ALT: 21 U/L (ref 0–55)
AST: 26 U/L (ref 5–34)
Albumin: 3.7 g/dL (ref 3.5–5.0)
Alkaline Phosphatase: 95 U/L (ref 40–150)
Anion Gap: 7 mEq/L (ref 3–11)
BILIRUBIN TOTAL: 0.6 mg/dL (ref 0.20–1.20)
BUN: 15 mg/dL (ref 7.0–26.0)
CHLORIDE: 108 meq/L (ref 98–109)
CO2: 25 meq/L (ref 22–29)
CREATININE: 1.1 mg/dL (ref 0.7–1.3)
Calcium: 10.3 mg/dL (ref 8.4–10.4)
EGFR: >60 mL/min/{1.73_m2} (ref >60)
GLUCOSE: 101 mg/dL (ref 70–140)
Potassium: 4.5 mEq/L (ref 3.5–5.1)
SODIUM: 140 meq/L (ref 136–145)
TOTAL PROTEIN: 6.5 g/dL (ref 6.4–8.3)

## 2017-06-06 LAB — CEA (IN HOUSE-CHCC): CEA (CHCC-IN HOUSE): 72.2 ng/mL — AB (ref 0.00–5.00)

## 2017-06-06 MED ORDER — CAPECITABINE 500 MG PO TABS: 2000.0000 mg | ORAL_TABLET | Freq: Two times a day (BID) | ORAL | 0 refills | Status: DC

## 2017-06-06 NOTE — Telephone Encounter (Signed)
Notified of message below

## 2017-06-06 NOTE — Telephone Encounter (Signed)
Scheduled appt per 12/26 los - Gave patient AVS and calender per los,.

## 2017-06-06 NOTE — Telephone Encounter (Signed)
-----   Message from Ladell Pier, MD sent at 06/06/2017  9:20 AM EST ----- Please call patient, the CEA is higher.  If higher again next month we will need to consider changing treatment, call for increased pain

## 2017-06-06 NOTE — Progress Notes (Signed)
[place] OFFICE PROGRESS NOTE   Diagnosis: Rectal cancer  INTERVAL HISTORY:   Nicholas Suarez returns as scheduled.  He began maintenance Xeloda 05/14/2017.  No mouth sores, diarrhea, or hand/foot pain.  He continues to have numbness in the extremities.  This has not changed. He developed "laryngitis "beginning yesterday.  No fever or shortness of breath.  He reports similar upper respiratory infections in the past that have responded to a steroid injection. He complains of arthralgias at the shoulders, hips, and elbows.  This is worse at night and improves during the day.  Objective:  Vital signs in last 24 hours:  Blood pressure 118/70, pulse 71, temperature 98 F (36.7 C), temperature source Oral, resp. rate 20, height '6\' 3"'  (1.905 m), weight 218 lb 1.6 oz (98.9 kg), SpO2 100 %.    HEENT: No thrush or ulcers, pharynx without erythema or exudate Resp: Lungs clear bilaterally, no respiratory distress Cardio: Regular rate and rhythm GI: No hepatosplenomegaly, no mass, nontender Vascular: Trace edema at the right greater than left lower leg, no erythema or tenderness  Skin: Palms without erythema or skin breakdown  Portacath/PICC-without erythema  Lab Results:  Lab Results  Component Value Date   WBC 7.1 06/06/2017   HGB 13.6 06/06/2017   HCT 40.4 06/06/2017   MCV 98.3 (H) 06/06/2017   PLT 117 (L) 06/06/2017   NEUTROABS 5.2 06/06/2017    CMP     Component Value Date/Time   NA 141 05/09/2017 1115   K 4.5 05/09/2017 1115   CL 108 01/29/2017 0835   CO2 27 05/09/2017 1115   GLUCOSE 94 05/09/2017 1115   BUN 11.0 05/09/2017 1115   CREATININE 1.0 05/09/2017 1115   CALCIUM 10.2 05/09/2017 1115   PROT 6.3 (L) 05/09/2017 1115   ALBUMIN 3.4 (L) 05/09/2017 1115   AST 35 (H) 05/09/2017 1115   ALT 30 05/09/2017 1115   ALKPHOS 99 05/09/2017 1115   BILITOT 0.49 05/09/2017 1115   GFRNONAA >60 01/29/2017 0835   GFRAA >60 01/29/2017 0835    Lab Results    Component Value Date   CEA1 33.52 (H) 05/09/2017     Medications: I have reviewed the patient's current medications.   Assessment/Plan: 1. Rectal cancer  MRI lumbar spine 10/20/2016 with bilateral hydronephrosis and retroperitoneal adenopathy  CT abdomen/pelvis 10/20/2016 with extensive retroperitoneal process with marked interstitial thickening and lymphadenopathy; bilateral hydronephrosis; similar ill-defined soft tissue density and skin thickening around the umbilicus; diffuse bladder wall thickening slightly asymmetric at the bladder dome but no obvious bladder mass  Biopsy para-aortic lymph node 11/10/2016-metastatic adenocarcinoma consistent with GI primary  11/23/2016 CEA elevated, CA-19-9 normal  Colonoscopy 11/27/2016-fungating infiltrative nonobstructing mass in the distal rectum involving the anal verge. The rectum was fixed and frozen in the pelvis precluding passage of scope beyond the distal sigmoid colon. 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  Foundation 1-microsatellite stable; tumor mutational burden 5; NOTCH3amplification; NOTCH3rearrangement exon 14  Upper endoscopy 11/27/2016-normal.  PET scan 06/22/2018showed minimal activity in the primary rectal carcinoma, diffuse wall thickening; mild activity associate with retroperitoneal lymph nodes; persistent haziness within the retroperitoneum. No evidence of liver metastasis or distant metastasis.  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  Cycle 2 FOLFOX 12/20/2016  Cycle 3 FOLFOX 01/03/2017 (oxaliplatin dose reduced secondary to thrombocytopenia)   Cycle 4 FOLFOX 01/17/2017 (oxaliplatin held due to thrombocytopenia)  Cycle 5 FOLFOX 02/15/2017  CTs 02/26/2017-new sclerotic lesions in the spine and  right iliac, no residual adenopathy, persistent rectal wall thickening  Cycle 6 FOLFOX 03/01/2017 (Neulasta added, oxaliplatin further dose reduced)   Cycle 7 FOLFOX 03/15/2017 (oxaliplatin held secondary to thrombocytopenia)  Cycle 8 FOLFOX 03/29/2017  Cycle 9 FOLFOX 04/12/2017 (oxaliplatin held secondary to thrombocytopenia and neuropathy)  Cycle 10 FOLFOX 04/25/2017 (oxaliplatin held secondary to neuropathy)  CTs 05/07/2017-stable rectal thickening, stable periaortic soft tissue, stable sclerotic lesions at the lumbar spine and pelvis  Treatment changed to maintenance capecitabine beginning 05/14/2017 2. Retroperitoneal lymphadenopathy secondary to #1 3. Bilateral hydronephrosis status post evaluation by urology 4. Asymmetry of the bladder dome on CT 10/20/2016 status post cystoscopy with biopsy planned 12/11/2016 (Dr. 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 Change in bowel habits, secondary to #1-improved. 6. Right leg edema, question due to lymphatic obstruction;venous Doppler negative for DVT 11/23/2016. 7. Port-A-Cath placement 12/05/2016 8. Thrombocytopenia secondary to chemotherapy; oxaliplatin dose reduced with cycle 3 FOLFOX. Progressive thrombocytopenia 01/17/2017; oxaliplatin held 01/17/2017. 9. Right lower extremity cellulitis 01/27/2017-blood culture positive for methicillin sensitive staph aureus; Port-A-Cath removed. PICC line placed. TEE 01/30/2017 without evidence of vegetation. 14 days of Ancef recommended after Port-A-Cath removal with end date 02/14/2017. 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  He has tolerated the Xeloda well.  He will resume treatment on 06/11/2017.  He most likely has a viral upper respiratory infection.  He will call for a fever or shortness of breath.  The arthralgias improved during the day and  are most likely related to arthritis as opposed to metastatic rectal cancer.  He will contact us for consistent pain.  Nicholas Suarez will return for an office and lab visit in approximately 1 month.  15 minutes were spent with the patient today.  The majority of the time was used for counseling and coordination of care.  Betsy Coder, MD  06/06/2017  8:56 AM

## 2017-06-11 ENCOUNTER — Encounter: Payer: Self-pay | Admitting: Internal Medicine

## 2017-06-11 ENCOUNTER — Ambulatory Visit: Payer: Federal, State, Local not specified - PPO | Admitting: Internal Medicine

## 2017-06-11 ENCOUNTER — Telehealth: Payer: Self-pay

## 2017-06-11 ENCOUNTER — Ambulatory Visit (INDEPENDENT_AMBULATORY_CARE_PROVIDER_SITE_OTHER)
Admission: RE | Admit: 2017-06-11 | Discharge: 2017-06-11 | Disposition: A | Discharge status: Home / Self Care | Payer: Federal, State, Local not specified - PPO | Source: Ambulatory Visit | Attending: Internal Medicine | Admitting: Internal Medicine

## 2017-06-11 VITALS — BP 108/60 | HR 85 | Temp 98.4°F | Resp 16 | Ht 75.0 in | Wt 221.0 lb

## 2017-06-11 DIAGNOSIS — M25511 Pain in right shoulder: Secondary | ICD-10-CM

## 2017-06-11 DIAGNOSIS — M159 Polyosteoarthritis, unspecified: Secondary | ICD-10-CM

## 2017-06-11 DIAGNOSIS — M150 Primary generalized (osteo)arthritis: Secondary | ICD-10-CM

## 2017-06-11 DIAGNOSIS — M25512 Pain in left shoulder: Secondary | ICD-10-CM

## 2017-06-11 DIAGNOSIS — Z23 Encounter for immunization: Secondary | ICD-10-CM | POA: Diagnosis not present

## 2017-06-11 DIAGNOSIS — G8929 Other chronic pain: Secondary | ICD-10-CM

## 2017-06-11 IMAGING — DX DG SHOULDER 2+V*L*
3 series · 3 of 3 positions shown · non-contrast
Comparison: None.

CLINICAL DATA: Bilateral shoulder pain, no known injury, initial
encounter

EXAM:
LEFT SHOULDER - 2+ VIEW

[grashey]
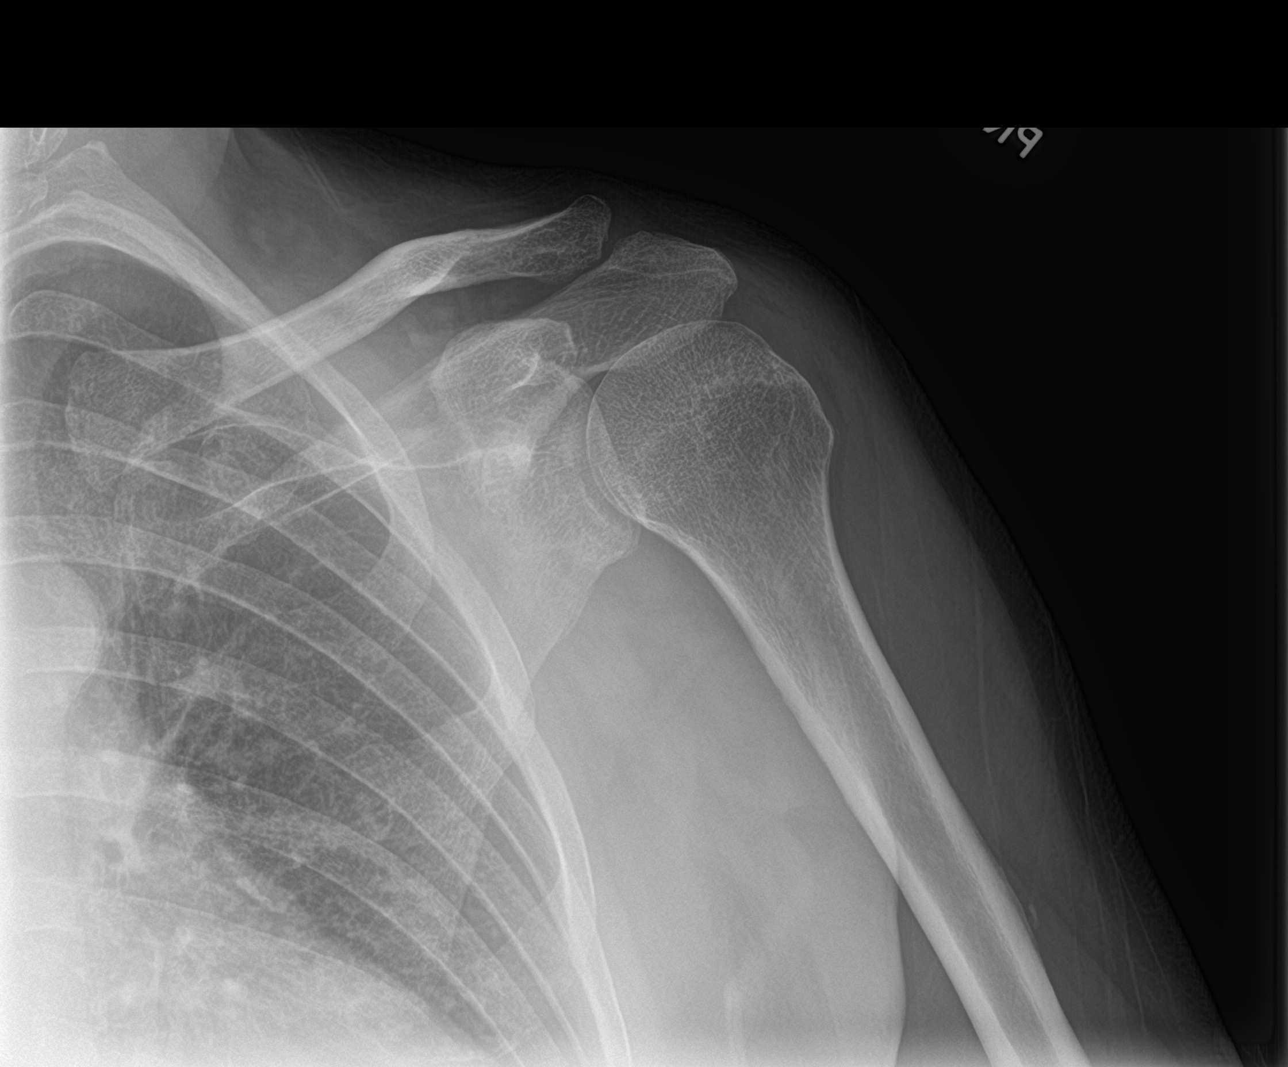

[y view]
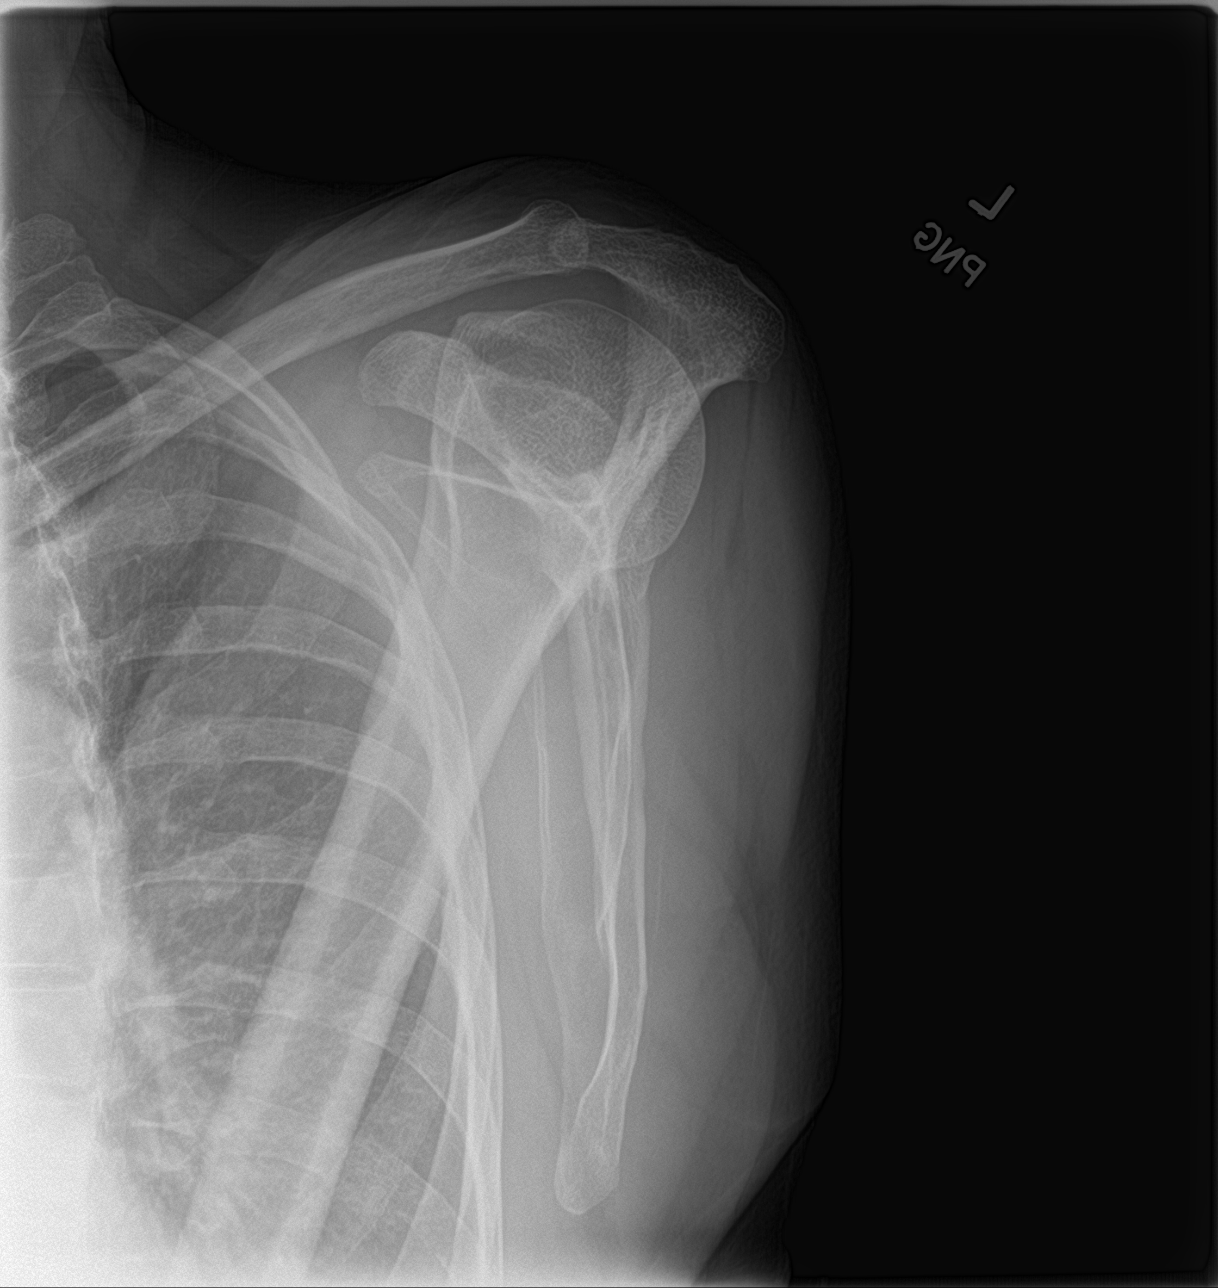

[shoulder axial]
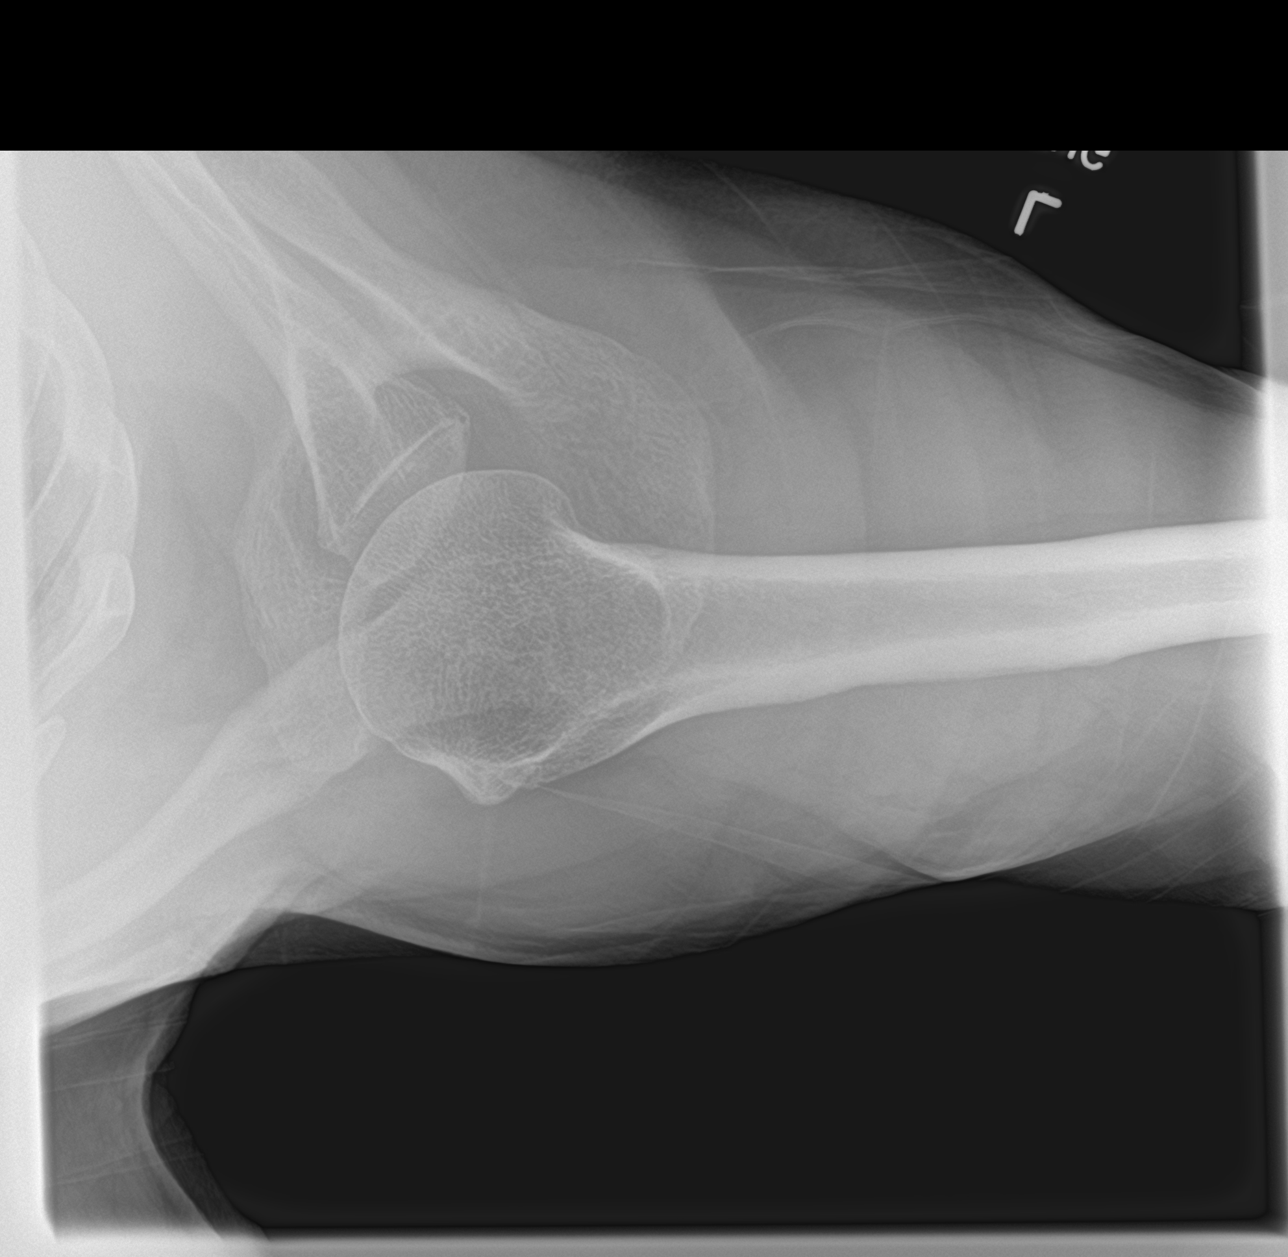

[3 of 3 positions shown; findings below may reference images not displayed]

FINDINGS: There is no evidence of fracture or dislocation. There is no
evidence of arthropathy or other focal bone abnormality. Soft
tissues are unremarkable.
IMPRESSION: No acute abnormality noted.

## 2017-06-11 MED ORDER — MELOXICAM 15 MG PO TABS: 15.0000 mg | ORAL_TABLET | Freq: Every day | ORAL | 1 refills | Status: DC

## 2017-06-11 NOTE — Telephone Encounter (Signed)
Specialty pharmacy, [facility], needed information verified before refilling Xeloda for patient. Called and spoke with Deidre Ala in pharmacy and he requested patient height, weight, and allergies on file. Provided information needed and Deidre Ala stated they "will get to work on that".   Called patient to inform of above message. Patient voiced understanding. Will call pharmacy to receive expected delivery date.

## 2017-06-11 NOTE — Patient Instructions (Signed)

## 2017-06-11 NOTE — Progress Notes (Signed)
Subjective:  Patient ID: Nicholas Suarez, male    DOB: Feb 07, 1951  Age: 66 y.o. MRN: [id number]  CC: Osteoarthritis   HPI Nicholas Suarez presents for an 56-month history of pain in the large joints including the shoulders, hips, and knees.  He has gelling in the morning for about 5 or 10 minutes but then feels better throughout the day with activity.  He describes the discomfort as an aching/stiffness.  He has no small joint symptoms and has not noticed any joint swelling or redness.  The symptoms are most prominent in the shoulders.  He has tried a couple doses of other over-the-counter anti-inflammatories and has gotten some symptom relief.  Outpatient Medications Prior to Visit  Medication Sig Dispense Refill  . aspirin 325 MG tablet Take 325 mg by mouth every 3 (three) days.     . capecitabine (XELODA) 500 MG tablet Take 4 tablets (2,000 mg total) by mouth 2 (two) times daily after a meal. Take on days 1-7 & 15-21, every 28 days. 112 tablet 0  . Multiple Vitamin (MULTIVITAMIN) tablet Take 1 tablet by mouth every 3 (three) days.     . Omega-3 Fatty Acids (FISH OIL) 1200 MG CAPS Take by mouth every 3 (three) days.     . Polyethylene Glycol 3350 (MIRALAX PO) Take by mouth daily.    . prochlorperazine (COMPAZINE) 10 MG tablet Take 1 tablet (10 mg total) by mouth every 6 (six) hours as needed for nausea or vomiting. 30 tablet 0  . saw palmetto 80 MG capsule Take 80 mg by mouth every 3 (three) days.     Marland Kitchen glucosamine-chondroitin 500-400 MG tablet Take 1 tablet by mouth every 3 (three) days.     Marland Kitchen ibuprofen (ADVIL,MOTRIN) 200 MG tablet Take 400 mg by mouth every 6 (six) hours as needed for moderate pain.     No facility-administered medications prior to visit.     ROS Review of Systems  Constitutional: Negative for chills, fatigue and fever.  HENT: Negative.   Eyes: Negative.   Respiratory: Negative for cough and shortness of breath.   Cardiovascular: Negative for chest pain,  palpitations and leg swelling.  Gastrointestinal: Negative for abdominal pain, constipation, diarrhea and nausea.  Endocrine: Negative.   Genitourinary: Negative.  Negative for difficulty urinating and dysuria.  Musculoskeletal: Positive for arthralgias. Negative for back pain, myalgias and neck pain.  Skin: Negative.  Negative for rash.  Allergic/Immunologic: Negative.   Neurological: Negative.  Negative for dizziness.  Hematological: Negative for adenopathy. Does not bruise/bleed easily.  Psychiatric/Behavioral: Negative.     Objective:  BP 108/60 (BP Location: Left Arm, Patient Position: Sitting, Cuff Size: Large)   Pulse 85   Temp 98.4 F (36.9 C) (Oral)   Resp 16   Ht 6\' 3"  (1.905 m)   Wt 221 lb (100.2 kg)   SpO2 95%   BMI 27.62 kg/m   BP Readings from Last 3 Encounters:  06/11/17 108/60  06/06/17 118/70  05/09/17 (!) 124/96    Wt Readings from Last 3 Encounters:  06/11/17 221 lb (100.2 kg)  06/06/17 218 lb 1.6 oz (98.9 kg)  05/09/17 214 lb 1.6 oz (97.1 kg)    Physical Exam  Constitutional: He is oriented to person, place, and time.  HENT:  Mouth/Throat: No oropharyngeal exudate.  Eyes: Conjunctivae are normal. Left eye exhibits no discharge. No scleral icterus.  Neck: Normal range of motion. Neck supple. No thyromegaly present.  Cardiovascular: Normal rate, regular rhythm and normal  heart sounds.  No murmur heard. Pulmonary/Chest: Effort normal and breath sounds normal. No respiratory distress. He has no wheezes. He has no rales.  Abdominal: Soft. Bowel sounds are normal. He exhibits no distension and no mass. There is no tenderness.  Musculoskeletal: Normal range of motion. He exhibits no edema, tenderness or deformity.       Right shoulder: He exhibits normal range of motion, no tenderness, no bony tenderness, no swelling, no effusion and no deformity.       Left shoulder: Normal. He exhibits normal range of motion, no tenderness, no bony tenderness, no  swelling, no effusion and no deformity.  Neurological: He is alert and oriented to person, place, and time.  Skin: Skin is warm and dry. No rash noted. No erythema. No pallor.  Vitals reviewed.   Lab Results  Component Value Date   WBC 7.1 06/06/2017   HGB 13.6 06/06/2017   HCT 40.4 06/06/2017   PLT 117 (L) 06/06/2017   GLUCOSE 101 06/06/2017   CHOL 184 11/01/2016   TRIG 95.0 11/01/2016   HDL 37.40 (L) 11/01/2016   LDLCALC 128 (H) 11/01/2016   ALT 21 06/06/2017   AST 26 06/06/2017   NA 140 06/06/2017   K 4.5 06/06/2017   CL 108 01/29/2017   CREATININE 1.1 06/06/2017   BUN 15.0 06/06/2017   CO2 25 06/06/2017   TSH 1.65 04/09/2014   PSA 0.41 11/01/2016   INR 0.96 01/31/2017    Ct Chest W Contrast  Result Date: 05/07/2017 CLINICAL DATA:  Rectal ca dx'd 10/2016. Ongoing IV chemo. Hx umbilical hernia repair. 100 cc ISO 300^161mL ISOVUE-300 IOPAMIDOL (ISOVUE-300) INJECTION 61% EXAM: CT CHEST, ABDOMEN, AND PELVIS WITH CONTRAST TECHNIQUE: Multidetector CT imaging of the chest, abdomen and pelvis was performed following the standard protocol during bolus administration of intravenous contrast. CONTRAST:  133mL ISOVUE-300 IOPAMIDOL (ISOVUE-300) INJECTION 61% COMPARISON:  CT 02/26/2017 FINDINGS: CT CHEST FINDINGS Cardiovascular: No significant vascular findings. Normal heart size. No pericardial effusion. Mediastinum/Nodes: No axillary or supraclavicular adenopathy. No mediastinal hilar adenopathy no pericardial fluid Lungs/Pleura: No suspicious pulmonary nodules. Musculoskeletal: No aggressive osseous lesion. CT ABDOMEN AND PELVIS FINDINGS Hepatobiliary: No focal hepatic lesion. No biliary ductal dilatation. Gallbladder is normal. Common bile duct is normal. Pancreas: Pancreas is normal. No ductal dilatation. No pancreatic inflammation. Spleen: Normal spleen Adrenals/urinary tract: Adrenal glands and kidneys are normal. The ureters and bladder normal. Stomach/Bowel: Stomach, small-bowel  appendix and cecum normal. Colon is normal. There is thickening through the distal rectum not changed. Vascular/Lymphatic: There is haziness in the retroperitoneal fat along the aorta. No lymphadenopathy. Atherosclerotic calcification aorta. Reproductive:  Normal prostate Other: Thickening at the umbilicus is similar comparison exam (image 91, series 2). Musculoskeletal: Subtle sclerotic lesion in the posterior aspect of the RIGHT 8 iliac bone and 2 small smudgy lesions within the L3 vertebral body are not changed. No new sclerotic lesions. IMPRESSION: Chest Impression: 1. No evidence of thoracic metastasis. Abdomen / Pelvis Impression: 1. No evidence of soft tissue metastasis in the abdomen pelvis. 2. Stable thickened distal rectum. 3. Haziness in the retroperitoneum along the aorta likely related to recent surgery. 4. Stable nodular thickening the umbilicus is indeterminate. 5. Subtle smudgy sclerotic lesions within the pelvis and lumbar spine not changed. Electronically Signed   By: Suzy Bouchard M.D.   On: 05/07/2017 17:20   Ct Abdomen Pelvis W Contrast  Result Date: 05/07/2017 CLINICAL DATA:  Rectal ca dx'd 10/2016. Ongoing IV chemo. Hx umbilical hernia repair. 100 cc ISO  300^112mL ISOVUE-300 IOPAMIDOL (ISOVUE-300) INJECTION 61% EXAM: CT CHEST, ABDOMEN, AND PELVIS WITH CONTRAST TECHNIQUE: Multidetector CT imaging of the chest, abdomen and pelvis was performed following the standard protocol during bolus administration of intravenous contrast. CONTRAST:  168mL ISOVUE-300 IOPAMIDOL (ISOVUE-300) INJECTION 61% COMPARISON:  CT 02/26/2017 FINDINGS: CT CHEST FINDINGS Cardiovascular: No significant vascular findings. Normal heart size. No pericardial effusion. Mediastinum/Nodes: No axillary or supraclavicular adenopathy. No mediastinal hilar adenopathy no pericardial fluid Lungs/Pleura: No suspicious pulmonary nodules. Musculoskeletal: No aggressive osseous lesion. CT ABDOMEN AND PELVIS FINDINGS Hepatobiliary:  No focal hepatic lesion. No biliary ductal dilatation. Gallbladder is normal. Common bile duct is normal. Pancreas: Pancreas is normal. No ductal dilatation. No pancreatic inflammation. Spleen: Normal spleen Adrenals/urinary tract: Adrenal glands and kidneys are normal. The ureters and bladder normal. Stomach/Bowel: Stomach, small-bowel appendix and cecum normal. Colon is normal. There is thickening through the distal rectum not changed. Vascular/Lymphatic: There is haziness in the retroperitoneal fat along the aorta. No lymphadenopathy. Atherosclerotic calcification aorta. Reproductive:  Normal prostate Other: Thickening at the umbilicus is similar comparison exam (image 91, series 2). Musculoskeletal: Subtle sclerotic lesion in the posterior aspect of the RIGHT 8 iliac bone and 2 small smudgy lesions within the L3 vertebral body are not changed. No new sclerotic lesions. IMPRESSION: Chest Impression: 1. No evidence of thoracic metastasis. Abdomen / Pelvis Impression: 1. No evidence of soft tissue metastasis in the abdomen pelvis. 2. Stable thickened distal rectum. 3. Haziness in the retroperitoneum along the aorta likely related to recent surgery. 4. Stable nodular thickening the umbilicus is indeterminate. 5. Subtle smudgy sclerotic lesions within the pelvis and lumbar spine not changed. Electronically Signed   By: Suzy Bouchard M.D.   On: 05/07/2017 17:20    Assessment & Plan:   Dickey was seen today for osteoarthritis.  Diagnoses and all orders for this visit:  Primary osteoarthritis involving multiple joints -     meloxicam (MOBIC) 15 MG tablet; Take 1 tablet (15 mg total) by mouth daily.  Chronic pain of both shoulders-  Examination of the shoulders is normal.  Plain films are normal.  Examination of his large, medium and small joints shows no evidence of synovitis or effusion.  Will treat for osteoarthritis. -     DG Shoulder Left; Future -     DG Shoulder Right; Future -     meloxicam  (MOBIC) 15 MG tablet; Take 1 tablet (15 mg total) by mouth daily.  Need for pneumococcal vaccination -     Pneumococcal conjugate vaccine 13-valent   I have discontinued Sonia Side L. Stonehouse's glucosamine-chondroitin and ibuprofen. I am also having him start on meloxicam. Additionally, I am having him maintain his multivitamin, Fish Oil, saw palmetto, aspirin, prochlorperazine, Polyethylene Glycol 3350 (MIRALAX PO), and capecitabine.  Meds ordered this encounter  Medications  . meloxicam (MOBIC) 15 MG tablet    Sig: Take 1 tablet (15 mg total) by mouth daily.    Dispense:  90 tablet    Refill:  1     Follow-up: Return if symptoms worsen or fail to improve.  Scarlette Calico, MD

## 2017-06-12 ENCOUNTER — Encounter: Payer: Self-pay | Admitting: Oncology

## 2017-06-18 ENCOUNTER — Telehealth: Payer: Self-pay | Admitting: Oncology

## 2017-06-18 NOTE — Telephone Encounter (Signed)
Scheduled appt per 1/4 sch message - Left message with appt date and time.

## 2017-06-28 ENCOUNTER — Other Ambulatory Visit: Payer: Self-pay

## 2017-06-28 DIAGNOSIS — C20 Malignant neoplasm of rectum: Secondary | ICD-10-CM

## 2017-07-02 ENCOUNTER — Other Ambulatory Visit: Payer: Self-pay | Admitting: Oncology

## 2017-07-02 DIAGNOSIS — C20 Malignant neoplasm of rectum: Secondary | ICD-10-CM

## 2017-07-04 ENCOUNTER — Other Ambulatory Visit: Payer: Federal, State, Local not specified - PPO

## 2017-07-04 ENCOUNTER — Ambulatory Visit: Payer: Federal, State, Local not specified - PPO | Admitting: Nurse Practitioner

## 2017-07-11 ENCOUNTER — Inpatient Hospital Stay: Payer: Federal, State, Local not specified - PPO | Attending: Oncology

## 2017-07-11 ENCOUNTER — Inpatient Hospital Stay (HOSPITAL_BASED_OUTPATIENT_CLINIC_OR_DEPARTMENT_OTHER): Payer: Federal, State, Local not specified - PPO | Admitting: Oncology

## 2017-07-11 ENCOUNTER — Telehealth: Payer: Self-pay | Admitting: Oncology

## 2017-07-11 VITALS — BP 108/71 | HR 79 | Temp 97.8°F | Resp 18 | Ht 75.0 in | Wt 220.1 lb

## 2017-07-11 DIAGNOSIS — C770 Secondary and unspecified malignant neoplasm of lymph nodes of head, face and neck: Secondary | ICD-10-CM

## 2017-07-11 DIAGNOSIS — C674 Malignant neoplasm of posterior wall of bladder: Secondary | ICD-10-CM | POA: Insufficient documentation

## 2017-07-11 DIAGNOSIS — C20 Malignant neoplasm of rectum: Secondary | ICD-10-CM

## 2017-07-11 LAB — CBC WITH DIFFERENTIAL/PLATELET
Basophils Absolute: 0 10*3/uL (ref 0.0–0.1)
Basophils Relative: 1 %
Eosinophils Absolute: 0.5 10*3/uL (ref 0.0–0.5)
Eosinophils Relative: 11 %
HEMATOCRIT: 38 % — AB (ref 38.4–49.9)
HEMOGLOBIN: 12.8 g/dL — AB (ref 13.0–17.1)
LYMPHS ABS: 1.1 10*3/uL (ref 0.9–3.3)
LYMPHS PCT: 27 %
MCH: 33 pg (ref 27.2–33.4)
MCHC: 33.6 g/dL (ref 32.0–36.0)
MCV: 98.1 fL — AB (ref 79.3–98.0)
MONO ABS: 0.4 10*3/uL (ref 0.1–0.9)
MONOS PCT: 8 %
NEUTROS ABS: 2.2 10*3/uL (ref 1.5–6.5)
NEUTROS PCT: 53 %
Platelets: 140 10*3/uL (ref 140–400)
RBC: 3.88 MIL/uL — ABNORMAL LOW (ref 4.20–5.82)
RDW: 14.6 % (ref 11.0–14.6)
WBC: 4.2 10*3/uL (ref 4.0–10.3)

## 2017-07-11 LAB — COMPREHENSIVE METABOLIC PANEL
ALBUMIN: 3.5 g/dL (ref 3.5–5.0)
ALK PHOS: 92 U/L (ref 40–150)
ALT: 18 U/L (ref 0–55)
ANION GAP: 5 (ref 3–11)
AST: 26 U/L (ref 5–34)
BUN: 12 mg/dL (ref 7–26)
CHLORIDE: 108 mmol/L (ref 98–109)
CO2: 26 mmol/L (ref 22–29)
Calcium: 10.3 mg/dL (ref 8.4–10.4)
Creatinine, Ser: 0.98 mg/dL (ref 0.70–1.30)
GFR calc Af Amer: >60 mL/min (ref >60)
GFR calc non Af Amer: >60 mL/min (ref >60)
GLUCOSE: 96 mg/dL (ref 70–140)
Potassium: 4.3 mmol/L (ref 3.5–5.1)
SODIUM: 139 mmol/L (ref 136–145)
Total Bilirubin: 0.6 mg/dL (ref 0.2–1.2)
Total Protein: 6.4 g/dL (ref 6.4–8.3)

## 2017-07-11 LAB — CEA (IN HOUSE-CHCC): CEA (CHCC-In House): 205.64 ng/mL — ABNORMAL HIGH (ref 0.00–5.00)

## 2017-07-11 NOTE — Progress Notes (Signed)
[name] OFFICE PROGRESS NOTE   Diagnosis: Colon cancer  INTERVAL HISTORY:   Nicholas Suarez returns as scheduled.  He feels well.  Stable neuropathy symptoms.  He started the most recent month of Xeloda with a one-week delay.  There was a delay receiving the Xeloda from his pharmacy.  He is now in his off week.  No mouth sores or hand/foot pain.  Rare diarrhea.  Objective:  Vital signs in last 24 hours:  Blood pressure 108/71, pulse 79, temperature 97.8 F (36.6 C), temperature source Oral, resp. rate 18, height _0  (1.905 m), weight 220 lb 1.6 oz (99.8 kg), SpO2 97 %.    HEENT: No thrush or screen ulcers, mild erythema at the posterior buccal mucosa Lymphatics: No cervical or supraclavicular nodes Resp: Lungs clear bilaterally Cardio: Regular rate and rhythm GI: No hepatomegaly, nontender Vascular: Trace edema at the right lower leg    Portacath/PICC-without erythema  Lab Results:  Lab Results  Component Value Date   WBC 4.2 07/11/2017   HGB 12.8 (L) 07/11/2017   HCT 38.0 (L) 07/11/2017   MCV 98.1 (H) 07/11/2017   PLT 140 07/11/2017   NEUTROABS 2.2 07/11/2017    CMP     Component Value Date/Time   NA 139 07/11/2017 0854   NA 140 06/06/2017 0813   K 4.3 07/11/2017 0854   K 4.5 06/06/2017 0813   CL 108 07/11/2017 0854   CO2 26 07/11/2017 0854   CO2 25 06/06/2017 0813   GLUCOSE 96 07/11/2017 0854   GLUCOSE 101 06/06/2017 0813   BUN 12 07/11/2017 0854   BUN 15.0 06/06/2017 0813   CREATININE 0.98 07/11/2017 0854   CREATININE 1.1 06/06/2017 0813   CALCIUM 10.3 07/11/2017 0854   CALCIUM 10.3 06/06/2017 0813   PROT 6.4 07/11/2017 0854   PROT 6.5 06/06/2017 0813   ALBUMIN 3.5 07/11/2017 0854   ALBUMIN 3.7 06/06/2017 0813   AST 26 07/11/2017 0854   AST 26 06/06/2017 0813   ALT 18 07/11/2017 0854   ALT 21 06/06/2017 0813   ALKPHOS 92 07/11/2017 0854   ALKPHOS 95 06/06/2017 0813   BILITOT 0.6 07/11/2017 0854   BILITOT 0.60 06/06/2017 0813   GFRNONAA >60 07/11/2017 0854   GFRAA >60 07/11/2017 0854    Lab Results  Component Value Date   CEA1 72.20 (H) 06/06/2017    Lab Results  Component Value Date   INR 0.96 01/31/2017    Medications: I have reviewed the patient's current medications.   Assessment/Plan: 1. Rectal cancer  MRI lumbar spine 10/20/2016 with bilateral hydronephrosis and retroperitoneal adenopathy  CT abdomen/pelvis 10/20/2016 with extensive retroperitoneal process with marked interstitial thickening and lymphadenopathy; bilateral hydronephrosis; similar ill-defined soft tissue density and skin thickening around the umbilicus; diffuse bladder wall thickening slightly asymmetric at the bladder dome but no obvious bladder mass  Biopsy para-aortic lymph node 11/10/2016-metastatic adenocarcinoma consistent with GI primary  11/23/2016 CEA elevated, CA-19-9 normal  Colonoscopy 11/27/2016-fungating infiltrative nonobstructing mass in the distal rectum involving the anal verge. The rectum was fixed and frozen in the pelvis precluding passage of scope beyond the distal sigmoid colon. 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  Foundation 1-microsatellite stable; tumor mutational burden 5; NOTCH3amplification; NOTCH3rearrangement exon 14  Upper endoscopy 11/27/2016-normal.  PET scan 06/22/2018showed minimal activity in the primary rectal carcinoma, diffuse wall thickening; mild activity associate with retroperitoneal lymph nodes; persistent haziness within the retroperitoneum. No evidence of liver metastasis or distant metastasis.  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  Cycle 2 FOLFOX 12/20/2016  Cycle 3 FOLFOX 01/03/2017 (oxaliplatin dose reduced secondary to thrombocytopenia)   Cycle 4 FOLFOX  01/17/2017 (oxaliplatin held due to thrombocytopenia)  Cycle 5 FOLFOX 02/15/2017  CTs 02/26/2017-new sclerotic lesions in the spine and right iliac, no residual adenopathy, persistent rectal wall thickening  Cycle 6 FOLFOX 03/01/2017 (Neulasta added, oxaliplatin further dose reduced)   Cycle 7 FOLFOX 03/15/2017 (oxaliplatin held secondary to thrombocytopenia)  Cycle 8 FOLFOX 03/29/2017  Cycle 9 FOLFOX 04/12/2017 (oxaliplatin held secondary to thrombocytopenia and neuropathy)  Cycle 10 FOLFOX 04/25/2017 (oxaliplatin held secondary to neuropathy)  CTs 05/07/2017-stable rectal thickening, stable periaortic soft tissue, stable sclerotic lesions at the lumbar spine and pelvis  Treatment changed to maintenance capecitabine beginning 05/14/2017 2. Retroperitoneal lymphadenopathy secondary to #1 3. Bilateral hydronephrosis status post evaluation by urology 4. Asymmetry of the bladder dome on CT 10/20/2016 status post cystoscopy with biopsy planned 12/11/2016 (Dr. 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 Change in bowel habits, secondary to #1-improved. 6. Right leg edema, question due to lymphatic obstruction;venous Doppler negative for DVT 11/23/2016. 7. Port-A-Cath placement 12/05/2016 8. Thrombocytopenia secondary to chemotherapy; oxaliplatin dose reduced with cycle 3 FOLFOX. Progressive thrombocytopenia 01/17/2017; oxaliplatin held 01/17/2017. 9. Right lower extremity cellulitis 01/27/2017-blood culture positive for methicillin sensitive staph aureus; Port-A-Cath removed. PICC line placed. TEE 01/30/2017 without evidence of vegetation. 14 days of Ancef recommended after Port-A-Cath removal with end date 02/14/2017. 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  He is tolerating the Xeloda well.  He will continue Xeloda on a 7-day on/7-day off schedule.  He will begin  the next week of Xeloda on 07/16/2017.  We will follow-up on the CEA from today.  If the CEA is significantly higher he will be referred for restaging CTs prior to an office visit in 4 weeks.  15 minutes were spent with the patient today.  The majority of the time was used for counseling and coordination of care.  Betsy Coder, MD  07/11/2017  10:05 AM

## 2017-07-11 NOTE — Telephone Encounter (Signed)
Scheduled appt per 1/30 los - Gave patient AVS and calender per los.  

## 2017-07-12 ENCOUNTER — Telehealth: Payer: Self-pay | Admitting: Emergency Medicine

## 2017-07-12 ENCOUNTER — Encounter: Payer: Self-pay | Admitting: Oncology

## 2017-07-12 ENCOUNTER — Other Ambulatory Visit: Payer: Self-pay | Admitting: Emergency Medicine

## 2017-07-12 DIAGNOSIS — C20 Malignant neoplasm of rectum: Secondary | ICD-10-CM

## 2017-07-12 NOTE — Telephone Encounter (Addendum)
Pt verbalized understanding of this note. CTs scheduled for 2/22. Instructions given to pt regarding Contrast. PT knows to pick up oral contrast before scan.   ----- Message from Ladell Pier, MD sent at 07/11/2017  6:31 PM EST ----- Please call patient The CEA is higher, please schedule CTs of the chest, abdomen, and pelvis a few days prior to the next office visit.  CTs are with contrast, thanks

## 2017-07-13 ENCOUNTER — Telehealth: Payer: Self-pay

## 2017-07-13 NOTE — Telephone Encounter (Signed)
Called to inform pt of message from Dr. Benay Spice. Per MD, CEA is higher and we will scan at next visit. Pt voiced understanding and states that he has already received appt for upcoming scan. This RN voiced understanding.

## 2017-07-26 ENCOUNTER — Other Ambulatory Visit: Payer: Self-pay | Admitting: Oncology

## 2017-07-26 DIAGNOSIS — C20 Malignant neoplasm of rectum: Secondary | ICD-10-CM

## 2017-08-03 ENCOUNTER — Ambulatory Visit (HOSPITAL_COMMUNITY)
Admission: RE | Admit: 2017-08-03 | Discharge: 2017-08-03 | Disposition: A | Discharge status: Home / Self Care | Payer: Medicare Other | Source: Ambulatory Visit | Attending: Oncology | Admitting: Oncology

## 2017-08-03 DIAGNOSIS — C772 Secondary and unspecified malignant neoplasm of intra-abdominal lymph nodes: Secondary | ICD-10-CM | POA: Insufficient documentation

## 2017-08-03 DIAGNOSIS — N1330 Unspecified hydronephrosis: Secondary | ICD-10-CM | POA: Diagnosis not present

## 2017-08-03 DIAGNOSIS — C20 Malignant neoplasm of rectum: Secondary | ICD-10-CM | POA: Diagnosis present

## 2017-08-03 DIAGNOSIS — I2510 Atherosclerotic heart disease of native coronary artery without angina pectoris: Secondary | ICD-10-CM | POA: Diagnosis not present

## 2017-08-03 DIAGNOSIS — M899 Disorder of bone, unspecified: Secondary | ICD-10-CM | POA: Insufficient documentation

## 2017-08-03 DIAGNOSIS — I700 Atherosclerosis of aorta: Secondary | ICD-10-CM | POA: Diagnosis not present

## 2017-08-03 DIAGNOSIS — R599 Enlarged lymph nodes, unspecified: Secondary | ICD-10-CM | POA: Insufficient documentation

## 2017-08-03 DIAGNOSIS — M799 Soft tissue disorder, unspecified: Secondary | ICD-10-CM | POA: Diagnosis not present

## 2017-08-03 MED ORDER — IOPAMIDOL (ISOVUE-300) INJECTION 61%
INTRAVENOUS | Status: AC
  Administered 2017-08-03: 100 mL
  Filled 2017-08-03: qty 100

## 2017-08-06 ENCOUNTER — Encounter: Payer: Self-pay | Admitting: Nurse Practitioner

## 2017-08-06 ENCOUNTER — Inpatient Hospital Stay: Payer: Medicare Other

## 2017-08-06 ENCOUNTER — Telehealth: Payer: Self-pay | Admitting: Nurse Practitioner

## 2017-08-06 ENCOUNTER — Inpatient Hospital Stay: Payer: Medicare Other | Attending: Oncology | Admitting: Nurse Practitioner

## 2017-08-06 ENCOUNTER — Telehealth: Payer: Self-pay | Admitting: Emergency Medicine

## 2017-08-06 VITALS — BP 125/73 | HR 65 | Temp 97.6°F | Resp 18 | Ht 75.0 in | Wt 219.5 lb

## 2017-08-06 DIAGNOSIS — C20 Malignant neoplasm of rectum: Secondary | ICD-10-CM | POA: Insufficient documentation

## 2017-08-06 DIAGNOSIS — C770 Secondary and unspecified malignant neoplasm of lymph nodes of head, face and neck: Secondary | ICD-10-CM | POA: Insufficient documentation

## 2017-08-06 DIAGNOSIS — C674 Malignant neoplasm of posterior wall of bladder: Secondary | ICD-10-CM | POA: Insufficient documentation

## 2017-08-06 DIAGNOSIS — R599 Enlarged lymph nodes, unspecified: Secondary | ICD-10-CM | POA: Diagnosis not present

## 2017-08-06 DIAGNOSIS — C786 Secondary malignant neoplasm of retroperitoneum and peritoneum: Secondary | ICD-10-CM | POA: Insufficient documentation

## 2017-08-06 DIAGNOSIS — M899 Disorder of bone, unspecified: Secondary | ICD-10-CM | POA: Diagnosis not present

## 2017-08-06 LAB — CBC WITH DIFFERENTIAL (CANCER CENTER ONLY)
BASOS ABS: 0.1 10*3/uL (ref 0.0–0.1)
Basophils Relative: 1 %
EOS ABS: 0.5 10*3/uL (ref 0.0–0.5)
EOS PCT: 11 %
HCT: 38.9 % (ref 38.4–49.9)
Hemoglobin: 13.2 g/dL (ref 13.0–17.1)
Lymphocytes Relative: 26 %
Lymphs Abs: 1.1 10*3/uL (ref 0.9–3.3)
MCH: 32.9 pg (ref 27.2–33.4)
MCHC: 33.9 g/dL (ref 32.0–36.0)
MCV: 97 fL (ref 79.3–98.0)
MONO ABS: 0.3 10*3/uL (ref 0.1–0.9)
Monocytes Relative: 7 %
Neutro Abs: 2.3 10*3/uL (ref 1.5–6.5)
Neutrophils Relative %: 55 %
PLATELETS: 119 10*3/uL — AB (ref 140–400)
RBC: 4.01 MIL/uL — AB (ref 4.20–5.82)
RDW: 14.2 % (ref 11.0–14.6)
WBC: 4.2 10*3/uL (ref 4.0–10.3)

## 2017-08-06 LAB — CMP (CANCER CENTER ONLY)
ALBUMIN: 3.6 g/dL (ref 3.5–5.0)
ALT: 17 U/L (ref 0–55)
AST: 22 U/L (ref 5–34)
Alkaline Phosphatase: 89 U/L (ref 40–150)
Anion gap: 6 (ref 3–11)
BUN: 16 mg/dL (ref 7–26)
CO2: 25 mmol/L (ref 22–29)
CREATININE: 0.92 mg/dL (ref 0.70–1.30)
Calcium: 10.4 mg/dL (ref 8.4–10.4)
Chloride: 107 mmol/L (ref 98–109)
GFR, Est AFR Am: >60 mL/min (ref >60)
GLUCOSE: 101 mg/dL (ref 70–140)
Potassium: 4.3 mmol/L (ref 3.5–5.1)
Sodium: 138 mmol/L (ref 136–145)
TOTAL PROTEIN: 6.6 g/dL (ref 6.4–8.3)
Total Bilirubin: 0.6 mg/dL (ref 0.2–1.2)

## 2017-08-06 LAB — CEA (IN HOUSE-CHCC): CEA (CHCC-In House): 385.08 ng/mL — ABNORMAL HIGH (ref 0.00–5.00)

## 2017-08-06 MED ORDER — MINOCYCLINE HCL 100 MG PO CAPS: 100.0000 mg | ORAL_CAPSULE | Freq: Two times a day (BID) | ORAL | 4 refills | Status: DC

## 2017-08-06 NOTE — Telephone Encounter (Signed)
Her-2 testing requested at this time to [facility] pathology. Per Ned Card Request.

## 2017-08-06 NOTE — Progress Notes (Addendum)
[facility] OFFICE PROGRESS NOTE   Diagnosis: Colon cancer  INTERVAL HISTORY:   [name] returns as scheduled.  He continues Xeloda 7 days on/7 days off.  He denies nausea/vomiting.  He intermittently notes mild mouth sores.  He is able to eat and drink without difficulty.  No diarrhea.  No hand or foot pain or redness.  He notes the right thigh is "tighter".  No associated pain or redness.  Objective:  Vital signs in last 24 hours:  Blood pressure 125/73, pulse 65, temperature 97.6 F (36.4 C), temperature source Oral, resp. rate 18, height _0  (1.905 m), weight 219 lb 8 oz (99.6 kg), SpO2 99 %.    HEENT: No thrush or ulcers. Resp: Lungs clear bilaterally. Cardio: Regular rate and rhythm. GI: Abdomen soft and nontender.  No hepatomegaly. Vascular: Trace edema throughout the right leg.  Skin: Palms without erythema.   Lab Results:  Lab Results  Component Value Date   WBC 4.2 08/06/2017   HGB 12.8 (L) 07/11/2017   HCT 38.9 08/06/2017   MCV 97.0 08/06/2017   PLT 119 (L) 08/06/2017   NEUTROABS 2.3 08/06/2017    Imaging:  No results found.  Medications: I have reviewed the patient's current medications.  Assessment/Plan: 1. Rectal cancer  MRI lumbar spine 10/20/2016 with bilateral hydronephrosis and retroperitoneal adenopathy  CT abdomen/pelvis 10/20/2016 with extensive retroperitoneal process with marked interstitial thickening and lymphadenopathy; bilateral hydronephrosis; similar ill-defined soft tissue density and skin thickening around the umbilicus; diffuse bladder wall thickening slightly asymmetric at the bladder dome but no obvious bladder mass  Biopsy para-aortic lymph node 11/10/2016-metastatic adenocarcinoma consistent with GI primary  11/23/2016 CEA elevated, CA-19-9 normal  Colonoscopy 11/27/2016-fungating infiltrative nonobstructing mass in the distal rectum involving the anal verge. The rectum was fixed and frozen in the pelvis  precluding passage of scope beyond the distal sigmoid colon. 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  PET scan 06/22/2018showed minimal activity in the primary rectal carcinoma, diffuse wall thickening; mild activity associate with retroperitoneal lymph nodes; persistent haziness within the retroperitoneum. No evidence of liver metastasis or distant metastasis.  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  Cycle 2 FOLFOX 12/20/2016  Cycle 3 FOLFOX 01/03/2017 (oxaliplatin dose reduced secondary to thrombocytopenia)   Cycle 4 FOLFOX 01/17/2017 (oxaliplatin held due to thrombocytopenia)  Cycle 5 FOLFOX 02/15/2017  CTs 02/26/2017-new sclerotic lesions in the spine and right iliac, no residual adenopathy, persistent rectal wall thickening  Cycle 6 FOLFOX 03/01/2017 (Neulasta added, oxaliplatin further dose reduced)   Cycle 7 FOLFOX 03/15/2017 (oxaliplatin held secondary to thrombocytopenia)  Cycle 8 FOLFOX 03/29/2017  Cycle 9 FOLFOX 04/12/2017 (oxaliplatin held secondary to thrombocytopenia and neuropathy)  Cycle 10 FOLFOX 04/25/2017 (oxaliplatin held secondary to neuropathy)  CTs 05/07/2017-stable rectal thickening, stable periaortic soft tissue, stable sclerotic lesions at the lumbar spine and pelvis  Treatment changed to maintenance capecitabine beginning 05/14/2017  CTs 08/03/2017-irregular annular wall thickening mid to lower rectum appears mildly increased; newly enlarged infiltrative retroperitoneal nodal metastasis; mild left supraclavicular adenopathy appears minimally increased; ill-defined umbilical soft tissue  thickening stable; small scattered sclerotic bone lesions stable. 2. Retroperitoneal lymphadenopathy secondary to #1 3. Bilateral hydronephrosis status post evaluation by urology 4. Asymmetry of the bladder dome on CT 10/20/2016 status post cystoscopy  with biopsy planned 12/11/2016 (Dr. Liliane Suarez of the posterior bladder wall confirmed metastatic carcinoma consistent with metastatic signet ring cell carcinoma 5. Change in bowel habits, secondary to #1-improved. 6. Right leg edema, question due to lymphatic obstruction;venous Doppler negative for DVT 11/23/2016. 7. Port-A-Cath placement 12/05/2016 8. Thrombocytopenia secondary to chemotherapy; oxaliplatin dose reduced with cycle 3 FOLFOX. Progressive thrombocytopenia 01/17/2017; oxaliplatin held 01/17/2017. 9. Right lower extremity cellulitis 01/27/2017-blood culture positive for methicillin sensitive staph aureus; Port-A-Cath removed. PICC line placed. TEE 01/30/2017 without evidence of vegetation. 14 days of Ancef recommended after Port-A-Cath removal with end date 02/14/2017. 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  He is currently on maintenance Xeloda.  The recent restaging CTs show evidence of progression with a newly enlarged retroperitoneal lymph node.  The CEA has increased over the past several weeks.  Dr. Benay Suarez reviewed the CT report/images with Nicholas Suarez and his family.  Options were discussed to include continuation of Xeloda, treatment break/observation, change treatment to FOLFIRI/Panitumumab, refer for consideration of enrollment on a clinical trial.  The decision was made to change treatment to FOLFIRI/Panitumumab.  We reviewed potential toxicities associated with irinotecan including bone marrow toxicity, nausea, diarrhea which may potentially be severe, hair loss.  We reviewed potential toxicities associated with Panitumumab including rash, diarrhea, allergic reaction.  He agrees to proceed.   He understands the rationale for beginning minocycline.  We are referring him for placement of a Port-A-Cath.  Anticipate he will return for cycle 1 FOLFIRI/Panitumumab on 08/13/2017.  He will contact the office in the interim with any problems.  Patient seen with Dr. Benay Suarez.  25 minutes were spent face-to-face at today's visit with the majority of that time involved in counseling/coordination of care.     Ned Card ANP/GNP-BC   08/06/2017  9:30 AM This was a shared visit with Ned Card.  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  The CEA has increased over the past few months.  The restaging CT evaluation reveals evidence of disease progression.  I reviewed the CT images with Nicholas Suarez and his family.  He does not have large volume disease and there is no evidence of visceral organ involvement.  We discussed a treatment break and switching to a different systemic therapy regimen.  He would like to proceed with treatment.  The tumor is RAS wild-type.  I recommend FOLFIRI/panitumumab.  We reviewed the potential toxicities associated with the FOLFIRI regimen including the chance for acute/delayed diarrhea, alopecia, and hematologic toxicity.  We discussed the allergic reaction, rash, and diarrhea associated with panitumumab.  Nicholas Suarez will be referred for placement of a Port-A-Cath with the plan to begin FOLFIRI/panitumumab 08/13/2017.  Julieanne Manson, MD

## 2017-08-06 NOTE — Progress Notes (Signed)
DISCONTINUE ON PATHWAY REGIMEN - Colorectal     A cycle is every 14 days:     Oxaliplatin      Leucovorin      5-Fluorouracil      5-Fluorouracil      Bevacizumab   **Always confirm dose/schedule in your pharmacy ordering system**    REASON: Disease Progression PRIOR TREATMENT: [id number]: mFOLFOX6 + Bevacizumab TREATMENT RESPONSE: Partial Response (PR)  START OFF PATHWAY REGIMEN - Colorectal   [id number]:FOLFIRI + Panitumumab q14d (**2 cycles per order sheet**):   A cycle is every 14 days:     Panitumumab      Irinotecan      Leucovorin      5-Fluorouracil      5-Fluorouracil   **Always confirm dose/schedule in your pharmacy ordering system**    Patient Characteristics: Metastatic Colorectal, Second Line, KRAS/NRAS Wild-Type, BRAF Wild-Type/Unknown, No Prior Anti-EGFR Therapy Current evidence of distant metastases<= Yes AJCC T Category: Staged < 8th Ed. AJCC N Category: Staged < 8th Ed. AJCC M Category: Staged < 8th Ed. AJCC 8 Stage Grouping: Staged < 8th Ed. BRAF Mutation Status: Wild Type (no mutation) KRAS/NRAS Mutation Status: Wild Type (no mutation) Line of therapy: Second Line Would you be surprised if this patient died  in the next year<= I would be surprised if this patient died in the next year Intent of Therapy: Non-Curative / Palliative Intent, Discussed with Patient

## 2017-08-06 NOTE — Telephone Encounter (Signed)
Scheduled appt per 2/25 los - unable to add treatment for 3/4 due to cap - logged - will contact patient when appt is scheduled.

## 2017-08-08 ENCOUNTER — Telehealth: Payer: Self-pay

## 2017-08-08 ENCOUNTER — Other Ambulatory Visit (HOSPITAL_COMMUNITY): Payer: Self-pay | Admitting: Interventional Radiology

## 2017-08-08 ENCOUNTER — Telehealth: Payer: Self-pay | Admitting: Oncology

## 2017-08-08 DIAGNOSIS — Z452 Encounter for adjustment and management of vascular access device: Secondary | ICD-10-CM

## 2017-08-08 NOTE — Telephone Encounter (Signed)
Received return call from pt regarding port placement appt. Informed pt of appt time and date. Pt to arrive @ [facility] short stay at 10am for a 12pm appt time. NPO after midnight before procedure. Will message scheduling to move appts with [place] to later date. Pt voiced understanding.

## 2017-08-08 NOTE — Telephone Encounter (Signed)
Received voicemail from pt regarding port placement. Currently [facility] only has availability as early as 3/11, pt needs earlier. Informed pt that I am waiting to hear of availability at [facility] and will keep him updated. Voiced understanding.

## 2017-08-08 NOTE — Telephone Encounter (Signed)
Scheduled appt per 2/27 sch message - left message for patient with new appt date and time.

## 2017-08-10 ENCOUNTER — Other Ambulatory Visit: Payer: Self-pay | Admitting: Radiology

## 2017-08-12 ENCOUNTER — Other Ambulatory Visit: Payer: Self-pay | Admitting: Oncology

## 2017-08-13 ENCOUNTER — Ambulatory Visit: Payer: Federal, State, Local not specified - PPO

## 2017-08-13 ENCOUNTER — Other Ambulatory Visit: Payer: Self-pay | Admitting: Radiology

## 2017-08-13 ENCOUNTER — Other Ambulatory Visit: Payer: Federal, State, Local not specified - PPO

## 2017-08-13 ENCOUNTER — Other Ambulatory Visit: Payer: Self-pay | Admitting: Student

## 2017-08-14 ENCOUNTER — Other Ambulatory Visit (HOSPITAL_COMMUNITY): Payer: Self-pay | Admitting: Interventional Radiology

## 2017-08-14 ENCOUNTER — Encounter (HOSPITAL_COMMUNITY): Payer: Self-pay

## 2017-08-14 ENCOUNTER — Ambulatory Visit (HOSPITAL_COMMUNITY)
Admission: RE | Admit: 2017-08-14 | Discharge: 2017-08-14 | Disposition: A | Discharge status: Home / Self Care | Payer: Medicare Other | Source: Ambulatory Visit | Attending: Interventional Radiology | Admitting: Interventional Radiology

## 2017-08-14 DIAGNOSIS — C20 Malignant neoplasm of rectum: Secondary | ICD-10-CM | POA: Insufficient documentation

## 2017-08-14 DIAGNOSIS — K5909 Other constipation: Secondary | ICD-10-CM | POA: Diagnosis not present

## 2017-08-14 DIAGNOSIS — Z452 Encounter for adjustment and management of vascular access device: Secondary | ICD-10-CM

## 2017-08-14 DIAGNOSIS — Z87891 Personal history of nicotine dependence: Secondary | ICD-10-CM | POA: Insufficient documentation

## 2017-08-14 DIAGNOSIS — C772 Secondary and unspecified malignant neoplasm of intra-abdominal lymph nodes: Secondary | ICD-10-CM | POA: Diagnosis not present

## 2017-08-14 DIAGNOSIS — Z7982 Long term (current) use of aspirin: Secondary | ICD-10-CM | POA: Insufficient documentation

## 2017-08-14 HISTORY — PX: IR US GUIDE VASC ACCESS RIGHT: IMG2390

## 2017-08-14 HISTORY — PX: IR FLUORO GUIDE PORT INSERTION RIGHT: IMG5741

## 2017-08-14 LAB — CBC
HEMATOCRIT: 42.7 % (ref 39.0–52.0)
Hemoglobin: 14.5 g/dL (ref 13.0–17.0)
MCH: 33.4 pg (ref 26.0–34.0)
MCHC: 34 g/dL (ref 30.0–36.0)
MCV: 98.4 fL (ref 78.0–100.0)
Platelets: 140 10*3/uL — ABNORMAL LOW (ref 150–400)
RBC: 4.34 MIL/uL (ref 4.22–5.81)
RDW: 14.7 % (ref 11.5–15.5)
WBC: 5 10*3/uL (ref 4.0–10.5)

## 2017-08-14 LAB — BASIC METABOLIC PANEL
ANION GAP: 7 (ref 5–15)
BUN: 15 mg/dL (ref 6–20)
CO2: 25 mmol/L (ref 22–32)
Calcium: 10.3 mg/dL (ref 8.9–10.3)
Chloride: 106 mmol/L (ref 101–111)
Creatinine, Ser: 0.96 mg/dL (ref 0.61–1.24)
GFR calc Af Amer: >60 mL/min (ref >60)
GLUCOSE: 97 mg/dL (ref 65–99)
POTASSIUM: 4.7 mmol/L (ref 3.5–5.1)
Sodium: 138 mmol/L (ref 135–145)

## 2017-08-14 LAB — PROTIME-INR
INR: 1.04
Prothrombin Time: 13.5 seconds (ref 11.4–15.2)

## 2017-08-14 MED ORDER — LIDOCAINE-EPINEPHRINE (PF) 1 %-1:200000 IJ SOLN
INTRAMUSCULAR | Status: AC
  Filled 2017-08-14: qty 30

## 2017-08-14 MED ORDER — CEFAZOLIN SODIUM-DEXTROSE 2-4 GM/100ML-% IV SOLN
INTRAVENOUS | Status: AC
  Filled 2017-08-14: qty 100

## 2017-08-14 MED ORDER — LIDOCAINE-EPINEPHRINE 2 %-1:100000 IJ SOLN
INTRAMUSCULAR | Status: AC | PRN
  Administered 2017-08-14: 10 mL

## 2017-08-14 MED ORDER — FENTANYL CITRATE (PF) 100 MCG/2ML IJ SOLN
INTRAMUSCULAR | Status: AC
  Filled 2017-08-14: qty 4

## 2017-08-14 MED ORDER — HEPARIN SOD (PORK) LOCK FLUSH 100 UNIT/ML IV SOLN
INTRAVENOUS | Status: AC
  Filled 2017-08-14: qty 5

## 2017-08-14 MED ORDER — SODIUM CHLORIDE 0.9 % IV SOLN: INTRAVENOUS | Status: DC

## 2017-08-14 MED ORDER — SODIUM CHLORIDE 0.9 % IV SOLN
INTRAVENOUS | Status: AC | PRN
  Administered 2017-08-14: 10 mL/h via INTRAVENOUS

## 2017-08-14 MED ORDER — FENTANYL CITRATE (PF) 100 MCG/2ML IJ SOLN
INTRAMUSCULAR | Status: AC | PRN
  Administered 2017-08-14 (×2): 50 ug via INTRAVENOUS

## 2017-08-14 MED ORDER — MIDAZOLAM HCL 2 MG/2ML IJ SOLN
INTRAMUSCULAR | Status: AC | PRN
  Administered 2017-08-14 (×2): 1 mg via INTRAVENOUS

## 2017-08-14 MED ORDER — CEFAZOLIN SODIUM-DEXTROSE 2-4 GM/100ML-% IV SOLN
2.0000 g | INTRAVENOUS | Status: AC
  Administered 2017-08-14: 2 g via INTRAVENOUS

## 2017-08-14 MED ORDER — MIDAZOLAM HCL 2 MG/2ML IJ SOLN
INTRAMUSCULAR | Status: AC
  Filled 2017-08-14: qty 4

## 2017-08-14 NOTE — Sedation Documentation (Signed)
Patient is resting comfortably. 

## 2017-08-14 NOTE — H&P (Signed)
Chief Complaint: Rectal Cancer  Referring Physician(s): Ned Card, NP  Supervising Physician: Arne Cleveland  Patient Status: [facility] - Out-pt  History of Present Illness: Nicholas Suarez is a 67 y.o. male with rectal cancer who is here today for placement of a Port A Cath.  He had one placed by Dr. Vernard Gambles last June, but unfortunately had to be removed due to bacteremia.  It was removed January 31, 2017 by Dr. Barbie Banner.  He feels well today. No fever/chills, no recent illness.  Past Medical History:  Diagnosis Date  . Allergic rhinitis, seasonal   . Anemia   . Bilateral hydrocele   . Bilateral hydronephrosis   . Chronic constipation   . Edema of right lower extremity   . 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 Wears glasses     Past Surgical History:  Procedure Laterality Date  . COLONOSCOPY WITH ESOPHAGOGASTRODUODENOSCOPY (EGD)  11-27-2016  dr Earlean Shawl   rectal mass  . CYSTOSCOPY WITH BIOPSY N/A 12/11/2016   Procedure: CYSTOSCOPY WITH BIOPSY;  Surgeon: Kathie Rhodes, MD;  Location: [facility];  Service: Urology;  Laterality: N/A;  . IR FLUORO GUIDE PORT INSERTION RIGHT  12/05/2016  . IR REMOVAL TUN ACCESS W/ PORT W/O FL MOD SED  01/31/2017  . IR US GUIDE VASC ACCESS RIGHT  12/05/2016  . TEE WITHOUT CARDIOVERSION N/A 01/30/2017   Procedure: TRANSESOPHAGEAL ECHOCARDIOGRAM (TEE);  Surgeon: Acie Fredrickson Wonda Cheng, MD;  Location: [facility] ENDOSCOPY;  Service: Cardiovascular;  Laterality: N/A;  . UMBILICAL HERNIA REPAIR  05/2016    Allergies: Patient has no known allergies.  Medications: Prior to Admission medications   Medication Sig Start Date End Date Taking? Authorizing Provider  aspirin 325 MG tablet Take 325 mg by mouth once a week.    Yes [provider]  capecitabine (XELODA) 500 MG tablet TAKE 4 TABLETS (2,000 MG) BY MOUTH TWICE DAILY AFTER  A MEAL ON DAYS 1 THROUGH 7 AND 15 THROUGH 21 EVERY 28 DAYS 07/26/17  Yes Ladell Pier, MD  lidocaine-prilocaine (EMLA) cream Apply 1 application topically as needed.   Yes [provider]  meloxicam (MOBIC) 15 MG tablet Take 1 tablet (15 mg total) by mouth daily. 06/11/17  Yes Janith Lima, MD  Misc Natural Products (GLUCOSAMINE CHOND COMPLEX/MSM PO) Take 1 tablet by mouth daily.   Yes [provider]  Multiple Vitamin (MULTIVITAMIN) tablet Take 1 tablet by mouth every 3 (three) days.    Yes [provider]  Omega-3 Fatty Acids (FISH OIL) 1200 MG CAPS Take by mouth every 3 (three) days.    Yes [provider]  Polyethylene Glycol 3350 (MIRALAX PO) Take 17 g by mouth daily as needed (constipation).    Yes [provider]  saw palmetto 80 MG capsule Take 80 mg by mouth every 3 (three) days.    Yes [provider]  minocycline (MINOCIN) 100 MG capsule Take 1 capsule (100 mg total) by mouth 2 (two) times daily. 08/06/17   [name], NP  prochlorperazine (COMPAZINE) 10 MG tablet Take 1 tablet (10 mg total) by mouth every 6 (six) hours as needed for nausea or vomiting. 11/30/16   [name], NP     Family History  Problem Relation Age of Onset  . Breast cancer Mother   . Alcohol abuse Father   . Heart disease Other   .  Cancer Other        Breast Cancer  . Arthritis Other   . Alcohol abuse Other   . Drug abuse Other   . Stroke Neg Hx   . Kidney disease Neg Hx   . Hypertension Neg Hx   . Hyperlipidemia Neg Hx   . Early death Neg Hx   . Colon cancer Neg Hx   . Esophageal cancer Neg Hx   . Rectal cancer Neg Hx   . Stomach cancer Neg Hx     Social History   Socioeconomic History  . Marital status: Married    Spouse name: None  . Number of children: None  . Years of education: None  . Highest education level: None  Social Needs  . Financial resource strain: None  . Food insecurity - worry: None  . Food insecurity -  inability: None  . Transportation needs - medical: None  . Transportation needs - non-medical: None  Occupational History  . None  Tobacco Use  . Smoking status: Former Smoker    Packs/day: 1.00    Years: 10.00    Pack years: 10.00    Types: Cigarettes, Pipe, Cigars    Last attempt to quit: 06/12/1988    Years since quitting: 29.1  . Smokeless tobacco: Former Systems developer    Types: [place] date: 06/12/1993  Substance and Sexual Activity  . Alcohol use: No  . Drug use: No  . Sexual activity: Yes  Other Topics Concern  . None  Social History Narrative  . None    Review of Systems: A 12 point ROS discussed  Review of Systems  Constitutional: Negative.   HENT: Negative.   Respiratory: Negative.   Cardiovascular: Negative.   Gastrointestinal: Negative.   Genitourinary: Negative.   Musculoskeletal: Negative.   Skin: Negative.   Neurological: Negative.   Hematological: Negative.   Psychiatric/Behavioral: Negative.     Vital Signs: BP 125/88 (BP Location: Right Arm)   Pulse 75   Temp 97.6 F (36.4 C) (Oral)   Ht 6\' 3"  (1.905 m)   Wt 200 lb (90.7 kg)   SpO2 96%   BMI 25.00 kg/m   Physical Exam  Constitutional: He is oriented to person, place, and time. He appears well-developed.  HENT:  Head: Normocephalic and atraumatic.  Eyes: EOM are normal.  Neck: Normal range of motion.  Cardiovascular: Normal rate, regular rhythm and normal heart sounds.  Pulmonary/Chest: Effort normal and breath sounds normal. No stridor. No respiratory distress.  Abdominal: He exhibits no distension.  Musculoskeletal: Normal range of motion.  Neurological: He is alert and oriented to person, place, and time.  Skin: Skin is warm and dry.  Psychiatric: He has a normal mood and affect. His behavior is normal. Judgment and thought content normal.  Vitals reviewed.   Imaging: Ct Chest W Contrast  Result Date: 08/03/2017 CLINICAL DATA:  Rectal adenocarcinoma diagnosed June 2018 with ongoing  chemotherapy. Rising CEA. Restaging. EXAM: CT CHEST, ABDOMEN, AND PELVIS WITH CONTRAST TECHNIQUE: Multidetector CT imaging of the chest, abdomen and pelvis was performed following the standard protocol during bolus administration of intravenous contrast. CONTRAST:  19mL ISOVUE-300 IOPAMIDOL (ISOVUE-300) INJECTION 61% COMPARISON:  05/07/2017 CT chest, abdomen and pelvis. FINDINGS: CT CHEST FINDINGS Cardiovascular: Normal heart size. No significant pericardial fluid/thickening. Left anterior descending coronary atherosclerosis. Atherosclerotic nonaneurysmal thoracic aorta. Stable borderline prominent main pulmonary artery (3.2 cm diameter). No central pulmonary emboli. Mediastinum/Nodes: No discrete thyroid nodules. Unremarkable esophagus. No axillary adenopathy. Mildly  enlarged 1.0 cm left supraclavicular node (series 2/image 6), minimally increased from 0.9 cm. No pathologically enlarged mediastinal or hilar nodes. Lungs/Pleura: No pneumothorax. No pleural effusion. No acute consolidative airspace disease, lung masses or significant pulmonary nodules. Musculoskeletal: Stable small sclerotic lesions in the T8 and T11 vertebral bodies, lateral right fifth rib and lower right scapula. No new focal osseous lesions. Mild thoracic spondylosis. CT ABDOMEN PELVIS FINDINGS Hepatobiliary: Normal liver with no liver mass. Normal gallbladder with no radiopaque cholelithiasis. No biliary ductal dilatation. Pancreas: Normal, with no mass or duct dilation. Spleen: Normal size. No mass. Adrenals/Urinary Tract: Normal adrenals. Stable mild bilateral hydronephrosis. Simple 1.7 cm interpolar left renal cyst. No new renal lesions. Stable periureteric fat stranding throughout the course of the bilateral ureters, which are normal caliber. Diffuse bladder wall thickening, probably unchanged accounting for minimal bladder distension on today's scan. Stomach/Bowel: Normal non-distended stomach. Normal caliber small bowel with no small  bowel wall thickening. Normal appendix. Irregular annular wall thickening in the mid to lower rectum appears mildly increased (series 2/image 126). Otherwise normal colon. Oral contrast reaches the descending colon. Vascular/Lymphatic: Atherosclerotic nonaneurysmal abdominal aorta. Patent portal, splenic, hepatic and renal veins. Newly enlarged infiltrative 1.4 cm retroperitoneal node between the proximal SMA and abdominal aorta (series 2/image 67). Ill-defined soft tissue thickening at the umbilicus measures 1.9 cm in AP dimensions (series 2/image 91), previously 2.0 cm, unchanged. No additional pathologically enlarged abdominopelvic nodes. Extensive patchy fat stranding and curvilinear retroperitoneal opacities throughout pericaval, aortocaval and left para-aortic spaces, not appreciably changed. Reproductive: Top-normal size prostate with nonspecific internal prostatic calcifications. Other: No pneumoperitoneum, ascites or focal fluid collection. Musculoskeletal: Stable small sclerotic lesions in the inferior L3 vertebral body and posteromedial right iliac bone. No new focal osseous lesions. Moderate lumbar spondylosis. IMPRESSION: 1. Irregular annular wall thickening in the mid to lower rectum appears mildly increased. 2. Newly enlarged infiltrative retroperitoneal nodal metastasis between SMA takeoff and abdominal aorta. 3. Mild left supraclavicular adenopathy appears minimally increased. 4. Ill-defined umbilical soft tissue thickening is stable. 5. Small scattered sclerotic osseous lesions are all stable. 6. Stable mild bilateral hydronephrosis. Stable diffuse bladder wall thickening. 7. Chronic findings include: Aortic Atherosclerosis (ICD10-I70.0). One vessel coronary atherosclerosis. Electronically Signed   By: Ilona Sorrel M.D.   On: 08/03/2017 09:22   Ct Abdomen Pelvis W Contrast  Result Date: 08/03/2017 CLINICAL DATA:  Rectal adenocarcinoma diagnosed June 2018 with ongoing chemotherapy. Rising CEA.  Restaging. EXAM: CT CHEST, ABDOMEN, AND PELVIS WITH CONTRAST TECHNIQUE: Multidetector CT imaging of the chest, abdomen and pelvis was performed following the standard protocol during bolus administration of intravenous contrast. CONTRAST:  151mL ISOVUE-300 IOPAMIDOL (ISOVUE-300) INJECTION 61% COMPARISON:  05/07/2017 CT chest, abdomen and pelvis. FINDINGS: CT CHEST FINDINGS Cardiovascular: Normal heart size. No significant pericardial fluid/thickening. Left anterior descending coronary atherosclerosis. Atherosclerotic nonaneurysmal thoracic aorta. Stable borderline prominent main pulmonary artery (3.2 cm diameter). No central pulmonary emboli. Mediastinum/Nodes: No discrete thyroid nodules. Unremarkable esophagus. No axillary adenopathy. Mildly enlarged 1.0 cm left supraclavicular node (series 2/image 6), minimally increased from 0.9 cm. No pathologically enlarged mediastinal or hilar nodes. Lungs/Pleura: No pneumothorax. No pleural effusion. No acute consolidative airspace disease, lung masses or significant pulmonary nodules. Musculoskeletal: Stable small sclerotic lesions in the T8 and T11 vertebral bodies, lateral right fifth rib and lower right scapula. No new focal osseous lesions. Mild thoracic spondylosis. CT ABDOMEN PELVIS FINDINGS Hepatobiliary: Normal liver with no liver mass. Normal gallbladder with no radiopaque cholelithiasis. No biliary ductal dilatation. Pancreas: Normal, with no  mass or duct dilation. Spleen: Normal size. No mass. Adrenals/Urinary Tract: Normal adrenals. Stable mild bilateral hydronephrosis. Simple 1.7 cm interpolar left renal cyst. No new renal lesions. Stable periureteric fat stranding throughout the course of the bilateral ureters, which are normal caliber. Diffuse bladder wall thickening, probably unchanged accounting for minimal bladder distension on today's scan. Stomach/Bowel: Normal non-distended stomach. Normal caliber small bowel with no small bowel wall thickening. Normal  appendix. Irregular annular wall thickening in the mid to lower rectum appears mildly increased (series 2/image 126). Otherwise normal colon. Oral contrast reaches the descending colon. Vascular/Lymphatic: Atherosclerotic nonaneurysmal abdominal aorta. Patent portal, splenic, hepatic and renal veins. Newly enlarged infiltrative 1.4 cm retroperitoneal node between the proximal SMA and abdominal aorta (series 2/image 67). Ill-defined soft tissue thickening at the umbilicus measures 1.9 cm in AP dimensions (series 2/image 91), previously 2.0 cm, unchanged. No additional pathologically enlarged abdominopelvic nodes. Extensive patchy fat stranding and curvilinear retroperitoneal opacities throughout pericaval, aortocaval and left para-aortic spaces, not appreciably changed. Reproductive: Top-normal size prostate with nonspecific internal prostatic calcifications. Other: No pneumoperitoneum, ascites or focal fluid collection. Musculoskeletal: Stable small sclerotic lesions in the inferior L3 vertebral body and posteromedial right iliac bone. No new focal osseous lesions. Moderate lumbar spondylosis. IMPRESSION: 1. Irregular annular wall thickening in the mid to lower rectum appears mildly increased. 2. Newly enlarged infiltrative retroperitoneal nodal metastasis between SMA takeoff and abdominal aorta. 3. Mild left supraclavicular adenopathy appears minimally increased. 4. Ill-defined umbilical soft tissue thickening is stable. 5. Small scattered sclerotic osseous lesions are all stable. 6. Stable mild bilateral hydronephrosis. Stable diffuse bladder wall thickening. 7. Chronic findings include: Aortic Atherosclerosis (ICD10-I70.0). One vessel coronary atherosclerosis. Electronically Signed   By: Ilona Sorrel M.D.   On: 08/03/2017 09:22    Labs:  CBC: Recent Labs    04/25/17 0836 05/09/17 1115 06/06/17 0813 07/11/17 0854 08/06/17 0848  WBC 3.3* 3.5* 7.1 4.2 4.2  HGB 12.7* 12.8* 13.6 12.8*  --   HCT 38.3*  38.1* 40.4 38.0* 38.9  PLT 124* 106* 117* 140 119*    COAGS: Recent Labs    11/10/16 0713 12/05/16 1134 12/14/16 1117 01/31/17 0008  INR 0.98 1.07 1.07 0.96  APTT 29 28 29   --     BMP: Recent Labs    01/28/17 1406 01/29/17 0835  05/09/17 1115 06/06/17 0813 07/11/17 0854 08/06/17 0848  NA 137 138   < > 141 140 139 138  K 4.5 4.1   < > 4.5 4.5 4.3 4.3  CL 107 108  --   --   --  108 107  CO2 24 25   < > 27 25 26 25   GLUCOSE 94 151*   < > 94 101 96 101  BUN 20 12   < > 11.0 15.0 12 16  CALCIUM 9.7 9.4   < > 10.2 10.3 10.3 10.4  CREATININE 1.02 1.06   < > 1.0 1.1 0.98 0.92  GFRNONAA >60 >60  --   --   --  >60 >60  GFRAA >60 >60  --   --   --  >60 >60   < > = values in this interval not displayed.    LIVER FUNCTION TESTS: Recent Labs    05/09/17 1115 06/06/17 0813 07/11/17 0854 08/06/17 0848  BILITOT 0.49 0.60 0.6 0.6  AST 35* 26 26 22   ALT 30 21 18 17   ALKPHOS 99 95 92 89  PROT 6.3* 6.5 6.4 6.6  ALBUMIN 3.4* 3.7 3.5  3.6    TUMOR MARKERS: No results for input(s): AFPTM, CEA, CA199, CHROMGRNA in the last 8760 hours.  Assessment and Plan:  Rectal cancer  Will proceed with placement of Port A cath today by Dr. Vernard Gambles.  Risks and benefits of image guided port-a-catheter placement was discussed with the patient including, but not limited to bleeding, infection, pneumothorax, or fibrin sheath development and need for additional procedures.  All of the patient's questions were answered, patient is agreeable to proceed. Consent signed and in chart.  Thank you for this interesting consult.  I greatly enjoyed meeting Nicholas Suarez and look forward to participating in their care.  A copy of this report was sent to the requesting provider on this date.  Electronically Signed: Murrell Redden, PA-C 08/14/2017, 10:42 AM   I spent a total of  25 Minutes in face to face in clinical consultation, greater than 50% of which was counseling/coordinating care for [facility] A  Cath.

## 2017-08-14 NOTE — Procedures (Signed)
  Procedure: R IJ Port    EBL:   minimal Complications:  none immediate  See full dictation in [facility] PACS.  D. Deatra Mcmahen MD Main # [phone] Pager  [phone]    

## 2017-08-14 NOTE — Discharge Instructions (Signed)
Tunneled Catheter Insertion, Care After °Refer to this sheet in the next few weeks. These instructions provide you with information about caring for yourself after your procedure. Your health care provider may also give you more specific instructions. Your treatment has been planned according to current medical practices, but problems sometimes occur. Call your health care provider if you have any problems or questions after your procedure. °What can I expect after the procedure? °After the procedure, it is common to have: °· Some mild redness, swelling, and pain around your catheter site. °· A small amount of blood or clear fluid coming from your incisions. ° °Follow these instructions at home: °Incision care °· Check your incision areas every day for signs of infection. Check for: °? More redness, swelling, or pain. °? More fluid or blood. °? Warmth. °? Pus or a bad smell. °· Follow instructions from your health care provider about how to take care of your incisions. Make sure you: °? Wash your hands with soap and water before you change your bandages (dressings). If soap and water are not available, use hand sanitizer. °? Change your dressings as told by your health care provider. Wash the area around your incisions with a germ-killing (antiseptic) solution when you change your dressing, as told by your health care provider. °? Leave stitches (sutures), skin glue, or adhesive strips in place. These skin closures may need to stay in place for 2 weeks or longer. If adhesive strip edges start to loosen and curl up, you may trim the loose edges. Do not remove adhesive strips completely unless your health care provider tells you to do that. °Catheter Care ° °· Wash your hands with soap and water before and after caring for your catheter. If soap and water are not available, use hand sanitizer. °· Keep your catheter site and your dressings clean and dry. °· Apply an antibiotic ointment to your catheter site as told by  your health care provider. °· Flush your catheter as told by your health care provider. This helps prevent it from becoming clogged. °· Do not open the caps on the ends of the catheter. °· Do not pull on your catheter. °· If your catheter is in your arm: °? Avoid wearing tight clothes or tight jewelry on your arm that has the catheter. °? Do not sleep with your head on the arm that has the catheter. °? Do not allow your blood pressure to be taken on the arm that has the catheter. °? Do not allow your blood to be drawn from the arm that has the catheter, except through the catheter itself. °Medicines °· Take over-the-counter and prescription medicines only as told by your health care provider. °· If you were prescribed an antibiotic medicine, take it as told by your health care provider. Do not stop taking the antibiotic even if you start to feel better. °Activity °· Return to your normal activities as told by your health care provider. Ask your health care provider what activities are safe for you. °· Do not lift anything that is heavier than 10 lb (4.5 kg) for 3 weeks or as long as told by your health care provider. °Driving °· Do not drive until your health care provider approves. °· Do not drive or operate heavy machinery while taking prescription pain medicine. °General instructions °· Follow your health care provider's specific instructions for the type of catheter that you have. °· Do not take baths, swim, or use a hot tub until your health   care provider approves. °· Follow instructions from your health care provider about eating or drinking restrictions. °· Wear compression stockings as told by your health care provider. These stockings help to prevent blood clots and reduce swelling in your legs. °· Keep all follow-up visits as told by your health care provider. This is important. °Contact a health care provider if: °· You have more fluid or blood coming from your incisions. °· You have more redness,  swelling, or pain at your incisions or around the area where your catheter is inserted. °· Your incisions feel warm to the touch. °· You feel unusually weak. °· You feel nauseous. °· Your catheter is not working properly. °· You have blood or fluid draining from your catheter. °· You are unable to flush your catheter. °Get help right away if: °· Your catheter breaks. °· A hole develops in your catheter. °· Your catheter comes loose or gets pulled completely out. If this happens, press on your catheter site firmly with your hand or a clean cloth until you get medical help. °· Your catheter becomes blocked. °· You have swelling in your arm, shoulder, neck, or face. °· You develop chest pain. °· You have difficulty breathing. °· You feel dizzy or light-headed. °· You have pus or a bad smell coming from your incisions. °· You have a fever. °· You develop bleeding from your catheter or your insertion site, and your bleeding does not stop. °This information is not intended to replace advice given to you by your health care provider. Make sure you discuss any questions you have with your health care provider. °Document Released: 05/15/2012 Document Revised: 01/30/2016 Document Reviewed: 02/22/2015 °[employer] Interactive Patient Education © 2018 [employer] ° °

## 2017-08-17 ENCOUNTER — Other Ambulatory Visit: Payer: Federal, State, Local not specified - PPO

## 2017-08-17 ENCOUNTER — Ambulatory Visit: Payer: Federal, State, Local not specified - PPO

## 2017-08-20 ENCOUNTER — Inpatient Hospital Stay: Payer: Medicare Other | Attending: Oncology

## 2017-08-20 ENCOUNTER — Inpatient Hospital Stay: Payer: Medicare Other

## 2017-08-20 VITALS — BP 113/73 | HR 76 | Temp 98.0°F | Resp 18 | Wt 217.4 lb

## 2017-08-20 DIAGNOSIS — R599 Enlarged lymph nodes, unspecified: Secondary | ICD-10-CM | POA: Insufficient documentation

## 2017-08-20 DIAGNOSIS — M899 Disorder of bone, unspecified: Secondary | ICD-10-CM | POA: Diagnosis not present

## 2017-08-20 DIAGNOSIS — C674 Malignant neoplasm of posterior wall of bladder: Secondary | ICD-10-CM | POA: Insufficient documentation

## 2017-08-20 DIAGNOSIS — C786 Secondary malignant neoplasm of retroperitoneum and peritoneum: Secondary | ICD-10-CM | POA: Insufficient documentation

## 2017-08-20 DIAGNOSIS — C770 Secondary and unspecified malignant neoplasm of lymph nodes of head, face and neck: Secondary | ICD-10-CM | POA: Insufficient documentation

## 2017-08-20 DIAGNOSIS — Z5111 Encounter for antineoplastic chemotherapy: Secondary | ICD-10-CM | POA: Diagnosis not present

## 2017-08-20 DIAGNOSIS — K1231 Oral mucositis (ulcerative) due to antineoplastic therapy: Secondary | ICD-10-CM | POA: Diagnosis not present

## 2017-08-20 DIAGNOSIS — C20 Malignant neoplasm of rectum: Secondary | ICD-10-CM | POA: Diagnosis present

## 2017-08-20 LAB — CMP (CANCER CENTER ONLY)
ALT: 16 U/L (ref 0–55)
AST: 21 U/L (ref 5–34)
Albumin: 3.5 g/dL (ref 3.5–5.0)
Alkaline Phosphatase: 93 U/L (ref 40–150)
Anion gap: 7 (ref 3–11)
BUN: 16 mg/dL (ref 7–26)
CHLORIDE: 107 mmol/L (ref 98–109)
CO2: 25 mmol/L (ref 22–29)
CREATININE: 1.03 mg/dL (ref 0.70–1.30)
Calcium: 10.5 mg/dL — ABNORMAL HIGH (ref 8.4–10.4)
Glucose, Bld: 133 mg/dL (ref 70–140)
Potassium: 4.3 mmol/L (ref 3.5–5.1)
Sodium: 139 mmol/L (ref 136–145)
Total Bilirubin: 0.6 mg/dL (ref 0.2–1.2)
Total Protein: 6.7 g/dL (ref 6.4–8.3)

## 2017-08-20 LAB — CBC WITH DIFFERENTIAL (CANCER CENTER ONLY)
Basophils Absolute: 0 10*3/uL (ref 0.0–0.1)
Basophils Relative: 1 %
EOS ABS: 0.5 10*3/uL (ref 0.0–0.5)
Eosinophils Relative: 9 %
HCT: 39.5 % (ref 38.4–49.9)
HEMOGLOBIN: 13.2 g/dL (ref 13.0–17.1)
LYMPHS ABS: 1.2 10*3/uL (ref 0.9–3.3)
Lymphocytes Relative: 20 %
MCH: 33 pg (ref 27.2–33.4)
MCHC: 33.4 g/dL (ref 32.0–36.0)
MCV: 98.6 fL — ABNORMAL HIGH (ref 79.3–98.0)
Monocytes Absolute: 0.4 10*3/uL (ref 0.1–0.9)
Monocytes Relative: 7 %
NEUTROS PCT: 63 %
Neutro Abs: 3.7 10*3/uL (ref 1.5–6.5)
Platelet Count: 119 10*3/uL — ABNORMAL LOW (ref 140–400)
RBC: 4 MIL/uL — AB (ref 4.20–5.82)
RDW: 15.1 % — ABNORMAL HIGH (ref 11.0–14.6)
WBC: 5.8 10*3/uL (ref 4.0–10.3)

## 2017-08-20 LAB — CEA (IN HOUSE-CHCC): CEA (CHCC-IN HOUSE): 522.11 ng/mL — AB (ref 0.00–5.00)

## 2017-08-20 LAB — MAGNESIUM: MAGNESIUM: 2.2 mg/dL (ref 1.7–2.4)

## 2017-08-20 MED ORDER — DEXAMETHASONE SODIUM PHOSPHATE 10 MG/ML IJ SOLN
10.0000 mg | Freq: Once | INTRAMUSCULAR | Status: AC
  Administered 2017-08-20: 10 mg via INTRAVENOUS

## 2017-08-20 MED ORDER — PALONOSETRON HCL INJECTION 0.25 MG/5ML
INTRAVENOUS | Status: AC
  Filled 2017-08-20: qty 5

## 2017-08-20 MED ORDER — LEUCOVORIN CALCIUM INJECTION 350 MG
400.0000 mg/m2 | Freq: Once | INTRAVENOUS | Status: AC
  Administered 2017-08-20: 920 mg via INTRAVENOUS
  Filled 2017-08-20: qty 46

## 2017-08-20 MED ORDER — ATROPINE SULFATE 1 MG/ML IJ SOLN: 0.5000 mg | Freq: Once | INTRAMUSCULAR | Status: DC | PRN

## 2017-08-20 MED ORDER — PALONOSETRON HCL INJECTION 0.25 MG/5ML
0.2500 mg | Freq: Once | INTRAVENOUS | Status: AC
  Administered 2017-08-20: 0.25 mg via INTRAVENOUS

## 2017-08-20 MED ORDER — SODIUM CHLORIDE 0.9 % IV SOLN: 10.0000 mg | Freq: Once | INTRAVENOUS | Status: DC

## 2017-08-20 MED ORDER — IRINOTECAN HCL CHEMO INJECTION 100 MG/5ML
180.0000 mg/m2 | Freq: Once | INTRAVENOUS | Status: AC
  Administered 2017-08-20: 420 mg via INTRAVENOUS
  Filled 2017-08-20: qty 21

## 2017-08-20 MED ORDER — ATROPINE SULFATE 1 MG/ML IJ SOLN
INTRAMUSCULAR | Status: AC
  Filled 2017-08-20: qty 1

## 2017-08-20 MED ORDER — SODIUM CHLORIDE 0.9 % IV SOLN
6.0000 mg/kg | Freq: Once | INTRAVENOUS | Status: AC
  Administered 2017-08-20: 600 mg via INTRAVENOUS
  Filled 2017-08-20: qty 20

## 2017-08-20 MED ORDER — SODIUM CHLORIDE 0.9 % IV SOLN
2400.0000 mg/m2 | INTRAVENOUS | Status: DC
  Administered 2017-08-20: 5500 mg via INTRAVENOUS
  Filled 2017-08-20: qty 110

## 2017-08-20 MED ORDER — FLUOROURACIL CHEMO INJECTION 2.5 GM/50ML
400.0000 mg/m2 | Freq: Once | INTRAVENOUS | Status: AC
  Administered 2017-08-20: 900 mg via INTRAVENOUS
  Filled 2017-08-20: qty 18

## 2017-08-20 MED ORDER — SODIUM CHLORIDE 0.9 % IV SOLN
Freq: Once | INTRAVENOUS | Status: AC
  Administered 2017-08-20: 09:00:00 via INTRAVENOUS

## 2017-08-20 MED ORDER — DEXAMETHASONE SODIUM PHOSPHATE 10 MG/ML IJ SOLN
INTRAMUSCULAR | Status: AC
  Filled 2017-08-20: qty 1

## 2017-08-20 NOTE — Patient Instructions (Addendum)
[facility] Discharge Instructions for Patients Receiving Chemotherapy  Today you received the following chemotherapy agents vectibix, irinotecan, leucovorin, and 5FU  To help prevent nausea and vomiting after your treatment, we encourage you to take your nausea medication as directed If you develop nausea and vomiting that is not controlled by your nausea medication, call the clinic.   BELOW ARE SYMPTOMS THAT SHOULD BE REPORTED IMMEDIATELY:  *FEVER GREATER THAN 100.5 F  *CHILLS WITH OR WITHOUT FEVER  NAUSEA AND VOMITING THAT IS NOT CONTROLLED WITH YOUR NAUSEA MEDICATION  *UNUSUAL SHORTNESS OF BREATH  *UNUSUAL BRUISING OR BLEEDING  TENDERNESS IN MOUTH AND THROAT WITH OR WITHOUT PRESENCE OF ULCERS  *URINARY PROBLEMS  *BOWEL PROBLEMS  UNUSUAL RASH Items with * indicate a potential emergency and should be followed up as soon as possible.  Feel free to call the clinic should you have any questions or concerns. The clinic phone number is [phone].  Please show the [facility] at check-in to the Emergency Department and triage nurse.  Irinotecan injection What is this medicine? IRINOTECAN (ir in oh TEE kan ) is a chemotherapy drug. It is used to treat colon and rectal cancer. This medicine may be used for other purposes; ask your health care provider or pharmacist if you have questions. COMMON BRAND NAME(S): Camptosar What should I tell my health care provider before I take this medicine? They need to know if you have any of these conditions: -blood disorders -dehydration -diarrhea -infection (especially a virus infection such as chickenpox, cold sores, or herpes) -liver disease -low blood counts, like low white cell, platelet, or red cell counts -recent or ongoing radiation therapy -an unusual or allergic reaction to irinotecan, sorbitol, other chemotherapy, other medicines, foods, dyes, or preservatives -pregnant or trying to get  pregnant -breast-feeding How should I use this medicine? This drug is given as an infusion into a vein. It is administered in a hospital or clinic by a specially trained health care professional. Talk to your pediatrician regarding the use of this medicine in children. Special care may be needed. Overdosage: If you think you have taken too much of this medicine contact a poison control center or emergency room at once. NOTE: This medicine is only for you. Do not share this medicine with others. What if I miss a dose? It is important not to miss your dose. Call your doctor or health care professional if you are unable to keep an appointment. What may interact with this medicine? Do not take this medicine with any of the following medications: -atazanavir -certain medicines for fungal infections like itraconazole and ketoconazole -St. John's Wort This medicine may also interact with the following medications: -dexamethasone -diuretics -laxatives -medicines for seizures like carbamazepine, mephobarbital, phenobarbital, phenytoin, primidone -medicines to increase blood counts like filgrastim, pegfilgrastim, sargramostim -prochlorperazine -vaccines This list may not describe all possible interactions. Give your health care provider a list of all the medicines, herbs, non-prescription drugs, or dietary supplements you use. Also tell them if you smoke, drink alcohol, or use illegal drugs. Some items may interact with your medicine. What should I watch for while using this medicine? Your condition will be monitored carefully while you are receiving this medicine. You will need important blood work done while you are taking this medicine. This drug may make you feel generally unwell. This is not uncommon, as chemotherapy can affect healthy cells as well as cancer cells. Report any side effects. Continue your course of treatment even though  you feel ill unless your doctor tells you to stop. In some  cases, you may be given additional medicines to help with side effects. Follow all directions for their use. You may get drowsy or dizzy. Do not drive, use machinery, or do anything that needs mental alertness until you know how this medicine affects you. Do not stand or sit up quickly, especially if you are an older patient. This reduces the risk of dizzy or fainting spells. Call your doctor or health care professional for advice if you get a fever, chills or sore throat, or other symptoms of a cold or flu. Do not treat yourself. This drug decreases your body's ability to fight infections. Try to avoid being around people who are sick. This medicine may increase your risk to bruise or bleed. Call your doctor or health care professional if you notice any unusual bleeding. Be careful brushing and flossing your teeth or using a toothpick because you may get an infection or bleed more easily. If you have any dental work done, tell your dentist you are receiving this medicine. Avoid taking products that contain aspirin, acetaminophen, ibuprofen, naproxen, or ketoprofen unless instructed by your doctor. These medicines may hide a fever. Do not become pregnant while taking this medicine. Women should inform their doctor if they wish to become pregnant or think they might be pregnant. There is a potential for serious side effects to an unborn child. Talk to your health care professional or pharmacist for more information. Do not breast-feed an infant while taking this medicine. What side effects may I notice from receiving this medicine? Side effects that you should report to your doctor or health care professional as soon as possible: -allergic reactions like skin rash, itching or hives, swelling of the face, lips, or tongue -low blood counts - this medicine may decrease the number of white blood cells, red blood cells and platelets. You may be at increased risk for infections and bleeding. -signs of infection  - fever or chills, cough, sore throat, pain or difficulty passing urine -signs of decreased platelets or bleeding - bruising, pinpoint red spots on the skin, black, tarry stools, blood in the urine -signs of decreased red blood cells - unusually weak or tired, fainting spells, lightheadedness -breathing problems -chest pain -diarrhea -feeling faint or lightheaded, falls -flushing, runny nose, sweating during infusion -mouth sores or pain -pain, swelling, redness or irritation where injected -pain, swelling, warmth in the leg -pain, tingling, numbness in the hands or feet -problems with balance, talking, walking -stomach cramps, pain -trouble passing urine or change in the amount of urine -vomiting as to be unable to hold down drinks or food -yellowing of the eyes or skin Side effects that usually do not require medical attention (report to your doctor or health care professional if they continue or are bothersome): -constipation -hair loss -headache -loss of appetite -nausea, vomiting -stomach upset This list may not describe all possible side effects. Call your doctor for medical advice about side effects. You may report side effects to FDA at 1-800-FDA-1088. Where should I keep my medicine? This drug is given in a hospital or clinic and will not be stored at home. NOTE: This sheet is a summary. It may not cover all possible information. If you have questions about this medicine, talk to your doctor, pharmacist, or health care provider.  2018 Elsevier/Gold Standard (2012-11-25 16:29:32)   Panitumumab (Vectibix) Solution for Injection What is this medicine? PANITUMUMAB (pan i TOOM ue mab) is  a monoclonal antibody. It is used to treat colorectal cancer. This medicine may be used for other purposes; ask your health care provider or pharmacist if you have questions. COMMON BRAND NAME(S): Vectibix What should I tell my health care provider before I take this medicine? They need to  know if you have any of these conditions: -eye disease, vision problems -low levels of calcium, magnesium, or potassium in the blood -lung or breathing disease, like asthma -skin conditions or sensitivity -an unusual or allergic reaction to panitumumab, other medicines, foods, dyes, or preservatives -pregnant or trying to get pregnant -breast-feeding How should I use this medicine? This drug is given as an infusion into a vein. It is administered in a hospital or clinic by a specially trained health care professional. Talk to your pediatrician regarding the use of this medicine in children. Special care may be needed. Overdosage: If you think you have taken too much of this medicine contact a poison control center or emergency room at once. NOTE: This medicine is only for you. Do not share this medicine with others. What if I miss a dose? It is important not to miss your dose. Call your doctor or health care professional if you are unable to keep an appointment. What may interact with this medicine? Do not take this medicine with any of the following medications: -bevacizumab This list may not describe all possible interactions. Give your health care provider a list of all the medicines, herbs, non-prescription drugs, or dietary supplements you use. Also tell them if you smoke, drink alcohol, or use illegal drugs. Some items may interact with your medicine. What should I watch for while using this medicine? Visit your doctor for checks on your progress. This drug may make you feel generally unwell. This is not uncommon, as chemotherapy can affect healthy cells as well as cancer cells. Report any side effects. Continue your course of treatment even though you feel ill unless your doctor tells you to stop. This medicine can make you more sensitive to the sun. Keep out of the sun while receiving this medicine and for 2 months after the last dose. If you cannot avoid being in the sun, wear  protective clothing and use sunscreen. Do not use sun lamps or tanning beds/booths. In some cases, you may be given additional medicines to help with side effects. Follow all directions for their use. Call your doctor or health care professional for advice if you get a fever, chills or sore throat, or other symptoms of a cold or flu. Do not treat yourself. This drug decreases your body's ability to fight infections. Try to avoid being around people who are sick. Avoid taking products that contain aspirin, acetaminophen, ibuprofen, naproxen, or ketoprofen unless instructed by your doctor. These medicines may hide a fever. Do not become pregnant while taking this medicine and for 2 months after the last dose. Women should inform their doctor if they wish to become pregnant or think they might be pregnant. There is a potential for serious side effects to an unborn child. Talk to your health care professional or pharmacist for more information. Do not breast-feed an infant while taking this medicine or for 2 months after the last dose. What side effects may I notice from receiving this medicine? Side effects that you should report to your doctor or health care professional as soon as possible: -allergic reactions like skin rash, itching or hives, swelling of the face, lips, or tongue -breathing problems -changes in vision -  eye pain -fast, irregular heartbeat -fever, chills -mouth sores -red spots on the skin -redness, blistering, peeling or loosening of the skin, including inside the mouth -signs and symptoms of kidney injury like trouble passing urine or change in the amount of urine -signs and symptoms of low blood pressure like dizziness; feeling faint or lightheaded, falls; unusually weak or tired -signs of low calcium like fast heartbeat, muscle cramps or muscle pain; pain, tingling, numbness in the hands or feet; seizures -signs and symptoms of low magnesium like muscle cramps, pain, or  weakness; tremors; seizures; or fast, irregular heartbeat -signs and symptoms of low potassium like muscle cramps or muscle pain; chest pain; dizziness; feeling faint or lightheaded, falls; palpitations; breathing problems; or fast, irregular heartbeat -swelling of the ankles, feet, hands Side effects that usually do not require medical attention (report to your doctor or health care professional if they continue or are bothersome): -changes in skin like acne, cracks, skin dryness -diarrhea -eyelash growth -headache -mouth sores -nail changes -nausea, vomiting This list may not describe all possible side effects. Call your doctor for medical advice about side effects. You may report side effects to FDA at 1-800-FDA-1088. Where should I keep my medicine? This drug is given in a hospital or clinic and will not be stored at home. NOTE: This sheet is a summary. It may not cover all possible information. If you have questions about this medicine, talk to your doctor, pharmacist, or health care provider.  2018 Elsevier/Gold Standard (2015-12-17 16:45:04)

## 2017-08-22 ENCOUNTER — Inpatient Hospital Stay: Payer: Medicare Other

## 2017-08-22 VITALS — BP 115/75 | HR 80 | Temp 98.1°F | Resp 18

## 2017-08-22 DIAGNOSIS — Z5111 Encounter for antineoplastic chemotherapy: Secondary | ICD-10-CM | POA: Diagnosis not present

## 2017-08-22 DIAGNOSIS — C20 Malignant neoplasm of rectum: Secondary | ICD-10-CM

## 2017-08-22 MED ORDER — HEPARIN SOD (PORK) LOCK FLUSH 100 UNIT/ML IV SOLN
500.0000 [IU] | Freq: Once | INTRAVENOUS | Status: AC | PRN
  Administered 2017-08-22: 500 [IU]
  Filled 2017-08-22: qty 5

## 2017-08-22 MED ORDER — SODIUM CHLORIDE 0.9% FLUSH
10.0000 mL | INTRAVENOUS | Status: DC | PRN
  Administered 2017-08-22: 10 mL
  Filled 2017-08-22: qty 10

## 2017-08-23 ENCOUNTER — Telehealth: Payer: Self-pay | Admitting: *Deleted

## 2017-08-23 NOTE — Telephone Encounter (Signed)
-----   Message from Murvin Natal, RN sent at 08/20/2017  2:03 PM EDT ----- Regarding: Pt of Dr. Benay Spice first time chemo follow up call Pt of Dr. Benay Spice first time irinotecan and Vectibix follow up call.  Pt tolerated treatment well.

## 2017-08-23 NOTE — Telephone Encounter (Signed)
TCT patient to follow up on 1st time Irinotecan and Vectix. Spoke with pt. He states he is doing well, in good spirits. His only c/o is his mouth is a little sore. Advised to use warm salt water/baking soda rinses, no mouthwash with alcohol, no spicy foods etc. Advised pt to keep his fluid intake up and then to call if his mouth gets worse or generally does not get better.  He voiced understanding.  He is aware of his upcoming appts.

## 2017-08-27 ENCOUNTER — Telehealth: Payer: Self-pay | Admitting: *Deleted

## 2017-08-27 ENCOUNTER — Ambulatory Visit: Payer: Federal, State, Local not specified - PPO

## 2017-08-27 ENCOUNTER — Ambulatory Visit: Payer: Federal, State, Local not specified - PPO | Admitting: Oncology

## 2017-08-27 ENCOUNTER — Other Ambulatory Visit: Payer: Federal, State, Local not specified - PPO

## 2017-08-27 ENCOUNTER — Inpatient Hospital Stay (HOSPITAL_BASED_OUTPATIENT_CLINIC_OR_DEPARTMENT_OTHER): Payer: Medicare Other | Admitting: Oncology

## 2017-08-27 VITALS — BP 115/69 | HR 71 | Temp 98.0°F | Resp 18 | Ht 75.0 in | Wt 210.5 lb

## 2017-08-27 DIAGNOSIS — C786 Secondary malignant neoplasm of retroperitoneum and peritoneum: Secondary | ICD-10-CM

## 2017-08-27 DIAGNOSIS — C674 Malignant neoplasm of posterior wall of bladder: Secondary | ICD-10-CM

## 2017-08-27 DIAGNOSIS — K1231 Oral mucositis (ulcerative) due to antineoplastic therapy: Secondary | ICD-10-CM | POA: Diagnosis not present

## 2017-08-27 DIAGNOSIS — C20 Malignant neoplasm of rectum: Secondary | ICD-10-CM

## 2017-08-27 DIAGNOSIS — C770 Secondary and unspecified malignant neoplasm of lymph nodes of head, face and neck: Secondary | ICD-10-CM | POA: Diagnosis not present

## 2017-08-27 DIAGNOSIS — Z5111 Encounter for antineoplastic chemotherapy: Secondary | ICD-10-CM | POA: Diagnosis not present

## 2017-08-27 DIAGNOSIS — R599 Enlarged lymph nodes, unspecified: Secondary | ICD-10-CM

## 2017-08-27 DIAGNOSIS — M899 Disorder of bone, unspecified: Secondary | ICD-10-CM

## 2017-08-27 NOTE — Telephone Encounter (Signed)
Message from pt reporting his mouth is "burned up and blistered." He called over the weekend and was prescribed Magic Mouthwash which hasn't really helped. Pt is able to drink fluids, unable to eat anything hard or acidic. He doesn't see any ulcerations, it is sore all over. Encouraged him to push fluids, MMW and saltwater rinses. Reviewed with Dr. Benay Spice: pt will be worked in today to evaluate mucositis. Called pt with appt.

## 2017-08-27 NOTE — Progress Notes (Signed)
[facility] OFFICE PROGRESS NOTE   Diagnosis: Rectal cancer  INTERVAL HISTORY:   Mr. Whitenack returns prior to his scheduled visit.  He completed cycle 1 FOLFIRI/panitumumab on 08/20/2017.  No nausea/vomiting or diarrhea.  No rash.  He complains of soreness in the mouth beginning on 08/24/2017.  He is tolerating liquids.  Objective:  Vital signs in last 24 hours:  Blood pressure 115/69, pulse 71, temperature 98 F (36.7 C), temperature source Oral, resp. rate 18, height _0  (1.905 m), weight 210 lb 8 oz (95.5 kg), SpO2 100 %.    HEENT: There are ulcers and erythema at the buccal mucosa bilaterally and lower lip.  No thrush Resp: Lungs clear bilaterally Cardio: Regular rate and rhythm GI: No hepatomegaly, no mass, nontender Vascular: No leg edema  Skin: Palms and soles without erythema, acne type rash over the frontal scalp, chest, and back  Portacath/PICC-without erythema  Lab Results:  Lab Results  Component Value Date   WBC 5.8 08/20/2017   HGB 14.5 08/14/2017   HCT 39.5 08/20/2017   MCV 98.6 (H) 08/20/2017   PLT 119 (L) 08/20/2017   NEUTROABS 3.7 08/20/2017    CMP     Component Value Date/Time   NA 139 08/20/2017 0811   NA 140 06/06/2017 0813   K 4.3 08/20/2017 0811   K 4.5 06/06/2017 0813   CL 107 08/20/2017 0811   CO2 25 08/20/2017 0811   CO2 25 06/06/2017 0813   GLUCOSE 133 08/20/2017 0811   GLUCOSE 101 06/06/2017 0813   BUN 16 08/20/2017 0811   BUN 15.0 06/06/2017 0813   CREATININE 1.03 08/20/2017 0811   CREATININE 1.1 06/06/2017 0813   CALCIUM 10.5 (H) 08/20/2017 0811   CALCIUM 10.3 06/06/2017 0813   PROT 6.7 08/20/2017 0811   PROT 6.5 06/06/2017 0813   ALBUMIN 3.5 08/20/2017 0811   ALBUMIN 3.7 06/06/2017 0813   AST 21 08/20/2017 0811   AST 26 06/06/2017 0813   ALT 16 08/20/2017 0811   ALT 21 06/06/2017 0813   ALKPHOS 93 08/20/2017 0811   ALKPHOS 95 06/06/2017 0813   BILITOT 0.6 08/20/2017 0811   BILITOT 0.60 06/06/2017 0813     GFRNONAA >60 08/20/2017 0811   GFRAA >60 08/20/2017 0811    Lab Results  Component Value Date   CEA1 522.11 (H) 08/20/2017    Lab Results  Component Value Date   INR 1.04 08/14/2017    Imaging:  No results found.  Medications: I have reviewed the patient's current medications.   Assessment/Plan: 1. Rectal cancer  MRI lumbar spine 10/20/2016 with bilateral hydronephrosis and retroperitoneal adenopathy  CT abdomen/pelvis 10/20/2016 with extensive retroperitoneal process with marked interstitial thickening and lymphadenopathy; bilateral hydronephrosis; similar ill-defined soft tissue density and skin thickening around the umbilicus; diffuse bladder wall thickening slightly asymmetric at the bladder dome but no obvious bladder mass  Biopsy para-aortic lymph node 11/10/2016-metastatic adenocarcinoma consistent with GI primary  11/23/2016 CEA elevated, CA-19-9 normal  Colonoscopy 11/27/2016-fungating infiltrative nonobstructing mass in the distal rectum involving the anal verge. The rectum was fixed and frozen in the pelvis precluding passage of scope beyond the distal sigmoid colon. 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  PET scan 06/22/2018showed minimal activity in the primary rectal carcinoma, diffuse wall thickening; mild activity associate with retroperitoneal lymph nodes; persistent haziness within the retroperitoneum. No evidence of liver metastasis or distant metastasis.  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  Cycle 2 FOLFOX 12/20/2016  Cycle 3 FOLFOX 01/03/2017 (oxaliplatin dose reduced secondary  to thrombocytopenia)   Cycle 4 FOLFOX 01/17/2017 (oxaliplatin held due to thrombocytopenia)  Cycle 5 FOLFOX 02/15/2017  CTs 02/26/2017-new sclerotic lesions in the spine and right iliac, no residual adenopathy, persistent rectal wall thickening  Cycle 6 FOLFOX 03/01/2017 (Neulasta added, oxaliplatin further dose reduced)   Cycle 7 FOLFOX 03/15/2017 (oxaliplatin held secondary to thrombocytopenia)  Cycle 8 FOLFOX 03/29/2017  Cycle 9 FOLFOX 04/12/2017 (oxaliplatin held secondary to thrombocytopenia and neuropathy)  Cycle 10 FOLFOX 04/25/2017 (oxaliplatin held secondary to neuropathy)  CTs 05/07/2017-stable rectal thickening, stable periaortic soft tissue, stable sclerotic lesions at the lumbar spine and pelvis  Treatment changed to maintenance capecitabine beginning 05/14/2017  CTs 08/03/2017-irregular annular wall thickening mid to lower rectum appears mildly increased; newly enlarged infiltrative retroperitoneal nodal metastasis; mild left supraclavicular adenopathy appears minimally increased; ill-defined umbilical soft tissue thickening stable; small scattered sclerotic bone lesions stable. 2. Retroperitoneal lymphadenopathy secondary to #1 3. Bilateral hydronephrosis status post evaluation by urology 4. Asymmetry of the bladder dome on CT 10/20/2016 status post cystoscopy with biopsy planned 12/11/2016 (Dr. 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 Change in bowel habits, secondary to #1-improved. 6. Right leg edema, question due to lymphatic obstruction;venous Doppler negative for DVT 11/23/2016. 7. Port-A-Cath placement 12/05/2016 8. Thrombocytopenia secondary to chemotherapy; oxaliplatin dose reduced with cycle 3 FOLFOX. Progressive thrombocytopenia 01/17/2017; oxaliplatin held 01/17/2017. 9. Right lower extremity cellulitis 01/27/2017-blood culture positive for methicillin sensitive staph  aureus; Port-A-Cath removed. PICC line placed. TEE 01/30/2017 without evidence of vegetation. 14 days of Ancef recommended after Port-A-Cath removal with end date 02/14/2017. 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  Mucositis following cycle 1 FOLFIRI/panitumumab Disposition: Nicholas Suarez is at day 8 following cycle 1 FOLFIRI/panitumumab he has developed oral mucositis.  He will use Magic mouthwash.  He will try Orajel if there is a focal area of increased pain. The 5-fluorouracil will be dose reduced with cycle 2. He will contact us for worsening of the mucositis or if he cannot tolerate liquids.  15 minutes were spent with the patient today.  The majority of the time was used for counseling and coordination of care.  Betsy Coder, MD  08/27/2017  5:16 PM

## 2017-08-31 ENCOUNTER — Other Ambulatory Visit: Payer: Self-pay | Admitting: *Deleted

## 2017-08-31 DIAGNOSIS — C20 Malignant neoplasm of rectum: Secondary | ICD-10-CM

## 2017-09-01 ENCOUNTER — Other Ambulatory Visit: Payer: Self-pay | Admitting: Oncology

## 2017-09-03 ENCOUNTER — Inpatient Hospital Stay (HOSPITAL_BASED_OUTPATIENT_CLINIC_OR_DEPARTMENT_OTHER): Payer: Medicare Other | Admitting: Oncology

## 2017-09-03 ENCOUNTER — Ambulatory Visit: Payer: Federal, State, Local not specified - PPO

## 2017-09-03 ENCOUNTER — Inpatient Hospital Stay: Payer: Medicare Other

## 2017-09-03 ENCOUNTER — Telehealth: Payer: Self-pay | Admitting: Oncology

## 2017-09-03 VITALS — BP 107/65 | HR 76 | Temp 97.9°F | Ht 75.0 in | Wt 207.6 lb

## 2017-09-03 DIAGNOSIS — K1231 Oral mucositis (ulcerative) due to antineoplastic therapy: Secondary | ICD-10-CM

## 2017-09-03 DIAGNOSIS — C20 Malignant neoplasm of rectum: Secondary | ICD-10-CM | POA: Diagnosis not present

## 2017-09-03 DIAGNOSIS — Z95828 Presence of other vascular implants and grafts: Secondary | ICD-10-CM

## 2017-09-03 DIAGNOSIS — R21 Rash and other nonspecific skin eruption: Secondary | ICD-10-CM | POA: Diagnosis not present

## 2017-09-03 DIAGNOSIS — Z5111 Encounter for antineoplastic chemotherapy: Secondary | ICD-10-CM | POA: Diagnosis not present

## 2017-09-03 DIAGNOSIS — C801 Malignant (primary) neoplasm, unspecified: Secondary | ICD-10-CM

## 2017-09-03 LAB — CMP (CANCER CENTER ONLY)
ALBUMIN: 3.5 g/dL (ref 3.5–5.0)
ALT: 20 U/L (ref 0–55)
AST: 23 U/L (ref 5–34)
Alkaline Phosphatase: 100 U/L (ref 40–150)
Anion gap: 6 (ref 3–11)
BILIRUBIN TOTAL: 0.7 mg/dL (ref 0.2–1.2)
BUN: 14 mg/dL (ref 7–26)
CO2: 26 mmol/L (ref 22–29)
CREATININE: 0.92 mg/dL (ref 0.70–1.30)
Calcium: 10.2 mg/dL (ref 8.4–10.4)
Chloride: 105 mmol/L (ref 98–109)
GFR, Est AFR Am: >60 mL/min (ref >60)
GLUCOSE: 96 mg/dL (ref 70–140)
POTASSIUM: 4.4 mmol/L (ref 3.5–5.1)
Sodium: 137 mmol/L (ref 136–145)
TOTAL PROTEIN: 6.6 g/dL (ref 6.4–8.3)

## 2017-09-03 LAB — CBC WITH DIFFERENTIAL (CANCER CENTER ONLY)
BASOS ABS: 0 10*3/uL (ref 0.0–0.1)
BASOS PCT: 1 %
EOS PCT: 15 %
Eosinophils Absolute: 0.7 10*3/uL — ABNORMAL HIGH (ref 0.0–0.5)
HEMATOCRIT: 39.2 % (ref 38.4–49.9)
Hemoglobin: 13.5 g/dL (ref 13.0–17.1)
Lymphocytes Relative: 19 %
Lymphs Abs: 0.8 10*3/uL — ABNORMAL LOW (ref 0.9–3.3)
MCH: 32.9 pg (ref 27.2–33.4)
MCHC: 34.4 g/dL (ref 32.0–36.0)
MCV: 95.6 fL (ref 79.3–98.0)
MONO ABS: 0.4 10*3/uL (ref 0.1–0.9)
MONOS PCT: 8 %
NEUTROS ABS: 2.5 10*3/uL (ref 1.5–6.5)
Neutrophils Relative %: 57 %
PLATELETS: 123 10*3/uL — AB (ref 140–400)
RBC: 4.1 MIL/uL — ABNORMAL LOW (ref 4.20–5.82)
RDW: 13.7 % (ref 11.0–14.6)
WBC Count: 4.4 10*3/uL (ref 4.0–10.3)

## 2017-09-03 LAB — MAGNESIUM: MAGNESIUM: 2.4 mg/dL (ref 1.5–2.5)

## 2017-09-03 LAB — CEA (IN HOUSE-CHCC): CEA (CHCC-In House): 387.73 ng/mL — ABNORMAL HIGH (ref 0.00–5.00)

## 2017-09-03 MED ORDER — HEPARIN SOD (PORK) LOCK FLUSH 100 UNIT/ML IV SOLN
500.0000 [IU] | Freq: Once | INTRAVENOUS | Status: AC
  Administered 2017-09-03: 500 [IU] via INTRAVENOUS
  Filled 2017-09-03: qty 5

## 2017-09-03 MED ORDER — SODIUM CHLORIDE 0.9% FLUSH
10.0000 mL | Freq: Once | INTRAVENOUS | Status: AC
  Administered 2017-09-03: 10 mL
  Filled 2017-09-03: qty 10

## 2017-09-03 MED ORDER — FLUTICASONE PROPIONATE 0.05 % EX CREA: TOPICAL_CREAM | Freq: Every day | CUTANEOUS | 0 refills | Status: DC | PRN

## 2017-09-03 NOTE — Patient Instructions (Signed)
Implanted Port Home Guide An implanted port is a type of central line that is placed under the skin. Central lines are used to provide IV access when treatment or nutrition needs to be given through a person's veins. Implanted ports are used for long-term IV access. An implanted port may be placed because:  You need IV medicine that would be irritating to the small veins in your hands or arms.  You need long-term IV medicines, such as antibiotics.  You need IV nutrition for a long period.  You need frequent blood draws for lab tests.  You need dialysis.  Implanted ports are usually placed in the chest area, but they can also be placed in the upper arm, the abdomen, or the leg. An implanted port has two main parts:  Reservoir. The reservoir is round and will appear as a small, raised area under your skin. The reservoir is the part where a needle is inserted to give medicines or draw blood.  Catheter. The catheter is a thin, flexible tube that extends from the reservoir. The catheter is placed into a large vein. Medicine that is inserted into the reservoir goes into the catheter and then into the vein.  How will I care for my incision site? Do not get the incision site wet. Bathe or shower as directed by your health care provider. How is my port accessed? Special steps must be taken to access the port:  Before the port is accessed, a numbing cream can be placed on the skin. This helps numb the skin over the port site.  Your health care provider uses a sterile technique to access the port. ? Your health care provider must put on a mask and sterile gloves. ? The skin over your port is cleaned carefully with an antiseptic and allowed to dry. ? The port is gently pinched between sterile gloves, and a needle is inserted into the port.  Only "non-coring" port needles should be used to access the port. Once the port is accessed, a blood return should be checked. This helps ensure that the port  is in the vein and is not clogged.  If your port needs to remain accessed for a constant infusion, a clear (transparent) bandage will be placed over the needle site. The bandage and needle will need to be changed every week, or as directed by your health care provider.  Keep the bandage covering the needle clean and dry. Do not get it wet. Follow your health care provider's instructions on how to take a shower or bath while the port is accessed.  If your port does not need to stay accessed, no bandage is needed over the port.  What is flushing? Flushing helps keep the port from getting clogged. Follow your health care provider's instructions on how and when to flush the port. Ports are usually flushed with saline solution or a medicine called heparin. The need for flushing will depend on how the port is used.  If the port is used for intermittent medicines or blood draws, the port will need to be flushed: ? After medicines have been given. ? After blood has been drawn. ? As part of routine maintenance.  If a constant infusion is running, the port may not need to be flushed.  How long will my port stay implanted? The port can stay in for as long as your health care provider thinks it is needed. When it is time for the port to come out, surgery will be   done to remove it. The procedure is similar to the one performed when the port was put in. When should I seek immediate medical care? When you have an implanted port, you should seek immediate medical care if:  You notice a bad smell coming from the incision site.  You have swelling, redness, or drainage at the incision site.  You have more swelling or pain at the port site or the surrounding area.  You have a fever that is not controlled with medicine.  This information is not intended to replace advice given to you by your health care provider. Make sure you discuss any questions you have with your health care provider. Document  Released: 05/29/2005 Document Revised: 11/04/2015 Document Reviewed: 02/03/2013 Elsevier Interactive Patient Education  2017 Elsevier Inc.  

## 2017-09-03 NOTE — Addendum Note (Signed)
Addended by: Sherril Croon on: 09/03/2017 02:53 PM   Modules accepted: Orders

## 2017-09-03 NOTE — Telephone Encounter (Signed)
Scheduled appt per 3/25 los - Gave patient AVS and calender per los.  

## 2017-09-03 NOTE — Progress Notes (Signed)
[facility] OFFICE PROGRESS NOTE   Diagnosis: Rectal cancer  INTERVAL HISTORY:   Nicholas Suarez returns as scheduled.  Completed cycle 1 FOLFIRI/panitumumab 08/20/2017.  I saw him on 08/27/2017 secondary to mucositis.  This has persisted.  He is able to eat and drink, but eating is painful.  He has felt an erythematous burning rash over the nose and forehead.  Leg edema has improved  Objective:  Vital signs in last 24 hours:  Blood pressure 107/65, pulse 76, temperature 97.9 F (36.6 C), temperature source Oral, height 6' 3" (1.905 m), weight 207 lb 9.6 oz (94.2 kg), SpO2 100 %.    HEENT: Ulcerations at the buccal mucosa bilaterally.  No thrush. Resp: Lungs clear bilaterally Cardio: Regular rate and rhythm GI: No hepatosplenomegaly Vascular: No leg edema  Skin: Acne type rash over the trunk and face.  Erythema over the nose and forehead.  Linear ulcerations at the fingertips  Portacath/PICC-without erythema  Lab Results:  Lab Results  Component Value Date   WBC 4.4 09/03/2017   HGB 14.5 08/14/2017   HCT 39.2 09/03/2017   MCV 95.6 09/03/2017   PLT 123 (L) 09/03/2017   NEUTROABS 2.5 09/03/2017    CMP     Component Value Date/Time   NA 137 09/03/2017 0954   NA 140 06/06/2017 0813   K 4.4 09/03/2017 0954   K 4.5 06/06/2017 0813   CL 105 09/03/2017 0954   CO2 26 09/03/2017 0954   CO2 25 06/06/2017 0813   GLUCOSE 96 09/03/2017 0954   GLUCOSE 101 06/06/2017 0813   BUN 14 09/03/2017 0954   BUN 15.0 06/06/2017 0813   CREATININE 0.92 09/03/2017 0954   CREATININE 1.1 06/06/2017 0813   CALCIUM 10.2 09/03/2017 0954   CALCIUM 10.3 06/06/2017 0813   PROT 6.6 09/03/2017 0954   PROT 6.5 06/06/2017 0813   ALBUMIN 3.5 09/03/2017 0954   ALBUMIN 3.7 06/06/2017 0813   AST 23 09/03/2017 0954   AST 26 06/06/2017 0813   ALT 20 09/03/2017 0954   ALT 21 06/06/2017 0813   ALKPHOS 100 09/03/2017 0954   ALKPHOS 95 06/06/2017 0813   BILITOT 0.7 09/03/2017 0954   BILITOT 0.60 06/06/2017 0813   GFRNONAA >60 09/03/2017 0954   GFRAA >60 09/03/2017 0954    Lab Results  Component Value Date   CEA1 522.11 (H) 08/20/2017     Medications: I have reviewed the patient's current medications.   Assessment/Plan: 1. Rectal cancer  MRI lumbar spine 10/20/2016 with bilateral hydronephrosis and retroperitoneal adenopathy  CT abdomen/pelvis 10/20/2016 with extensive retroperitoneal process with marked interstitial thickening and lymphadenopathy; bilateral hydronephrosis; similar ill-defined soft tissue density and skin thickening around the umbilicus; diffuse bladder wall thickening slightly asymmetric at the bladder dome but no obvious bladder mass  Biopsy para-aortic lymph node 11/10/2016-metastatic adenocarcinoma consistent with GI primary  11/23/2016 CEA elevated, CA-19-9 normal  Colonoscopy 11/27/2016-fungating infiltrative nonobstructing mass in the distal rectum involving the anal verge. The rectum was fixed and frozen in the pelvis precluding passage of scope beyond the distal sigmoid colon. 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  PET scan 06/22/2018showed minimal activity in the primary rectal carcinoma, diffuse wall thickening; mild activity associate with retroperitoneal lymph nodes; persistent haziness within the retroperitoneum. No evidence of liver metastasis or distant metastasis.  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  Cycle 2 FOLFOX 12/20/2016  Cycle 3 FOLFOX 01/03/2017 (oxaliplatin dose reduced secondary to thrombocytopenia)   Cycle 4 FOLFOX 01/17/2017 (oxaliplatin  held due to thrombocytopenia)  Cycle 5 FOLFOX 02/15/2017  CTs 02/26/2017-new sclerotic lesions in the spine and right iliac, no residual adenopathy, persistent rectal wall thickening  Cycle 6 FOLFOX 03/01/2017 (Neulasta added, oxaliplatin further dose reduced)   Cycle 7 FOLFOX 03/15/2017 (oxaliplatin held secondary to thrombocytopenia)  Cycle 8 FOLFOX 03/29/2017  Cycle 9 FOLFOX 04/12/2017 (oxaliplatin held secondary to thrombocytopenia and neuropathy)  Cycle 10 FOLFOX 04/25/2017 (oxaliplatin held secondary to neuropathy)  CTs 05/07/2017-stable rectal thickening, stable periaortic soft tissue, stable sclerotic lesions at the lumbar spine and pelvis  Treatment changed to maintenance capecitabine beginning 05/14/2017  CTs 08/03/2017-irregular annular wall thickening mid to lower rectum appears mildly increased; newly enlarged infiltrative retroperitoneal nodal metastasis; mild left supraclavicular adenopathy appears minimally increased; ill-defined umbilical soft tissue thickening stable; small scattered sclerotic bone lesions stable. 2. Retroperitoneal lymphadenopathy secondary to #1 3. Bilateral hydronephrosis status post evaluation by urology 4. Asymmetry of the bladder dome on CT 10/20/2016 status post cystoscopy with biopsy planned 12/11/2016 (Dr. 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 Change in bowel habits, secondary to #1-improved. 6. Right leg edema, question due to lymphatic obstruction;venous Doppler negative for DVT 11/23/2016. 7. Port-A-Cath placement 12/05/2016 8. Thrombocytopenia secondary to chemotherapy; oxaliplatin dose reduced with cycle 3 FOLFOX. Progressive thrombocytopenia 01/17/2017; oxaliplatin held 01/17/2017. 9. Right lower extremity cellulitis 01/27/2017-blood culture positive for methicillin sensitive staph aureus; Port-A-Cath removed. PICC line placed. TEE 01/30/2017  without evidence of vegetation. 14 days of Ancef recommended after Port-A-Cath removal with end date 02/14/2017. 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  Mucositis following cycle 1 FOLFIRI/panitumumab-cycle 2 delayed and 5-FU dose reduced   Disposition: Nicholas Suarez has persistent mucositis.  We decided to delay cycle 2 FOLFIRI/panitumumab for 1 week.  5-fluorouracil will be dose reduced with cycle 2.  He will return for an office visit prior to cycle 2. He has developed a Panitumumab rash.  He will continue minocycline.  We gave him a prescription for fluticasone to use as needed on the face.  25 minutes were spent with the patient today.  The majority of the time was used for counseling and coordination of care.  Betsy Coder, MD  09/03/2017  11:18 AM

## 2017-09-10 ENCOUNTER — Encounter: Payer: Self-pay | Admitting: Nurse Practitioner

## 2017-09-10 ENCOUNTER — Inpatient Hospital Stay: Payer: Medicare Other | Attending: Oncology

## 2017-09-10 ENCOUNTER — Inpatient Hospital Stay: Payer: Medicare Other

## 2017-09-10 ENCOUNTER — Telehealth: Payer: Self-pay | Admitting: Oncology

## 2017-09-10 ENCOUNTER — Inpatient Hospital Stay (HOSPITAL_BASED_OUTPATIENT_CLINIC_OR_DEPARTMENT_OTHER): Payer: Medicare Other | Admitting: Nurse Practitioner

## 2017-09-10 VITALS — BP 99/56 | HR 71 | Temp 97.7°F | Resp 18 | Ht 75.0 in | Wt 213.4 lb

## 2017-09-10 DIAGNOSIS — C20 Malignant neoplasm of rectum: Secondary | ICD-10-CM

## 2017-09-10 DIAGNOSIS — Z452 Encounter for adjustment and management of vascular access device: Secondary | ICD-10-CM | POA: Insufficient documentation

## 2017-09-10 DIAGNOSIS — C786 Secondary malignant neoplasm of retroperitoneum and peritoneum: Secondary | ICD-10-CM | POA: Insufficient documentation

## 2017-09-10 DIAGNOSIS — C770 Secondary and unspecified malignant neoplasm of lymph nodes of head, face and neck: Secondary | ICD-10-CM | POA: Insufficient documentation

## 2017-09-10 DIAGNOSIS — C674 Malignant neoplasm of posterior wall of bladder: Secondary | ICD-10-CM

## 2017-09-10 DIAGNOSIS — Z5111 Encounter for antineoplastic chemotherapy: Secondary | ICD-10-CM | POA: Insufficient documentation

## 2017-09-10 DIAGNOSIS — Z95828 Presence of other vascular implants and grafts: Secondary | ICD-10-CM

## 2017-09-10 DIAGNOSIS — K1231 Oral mucositis (ulcerative) due to antineoplastic therapy: Secondary | ICD-10-CM

## 2017-09-10 LAB — CBC WITH DIFFERENTIAL (CANCER CENTER ONLY)
BASOS ABS: 0.1 10*3/uL (ref 0.0–0.1)
Basophils Relative: 3 %
Eosinophils Absolute: 0.8 10*3/uL — ABNORMAL HIGH (ref 0.0–0.5)
Eosinophils Relative: 19 %
HEMATOCRIT: 38.3 % — AB (ref 38.4–49.9)
Hemoglobin: 13 g/dL (ref 13.0–17.1)
LYMPHS ABS: 0.8 10*3/uL — AB (ref 0.9–3.3)
LYMPHS PCT: 20 %
MCH: 33.1 pg (ref 27.2–33.4)
MCHC: 33.9 g/dL (ref 32.0–36.0)
MCV: 97.7 fL (ref 79.3–98.0)
MONO ABS: 0.3 10*3/uL (ref 0.1–0.9)
MONOS PCT: 9 %
NEUTROS ABS: 2 10*3/uL (ref 1.5–6.5)
Neutrophils Relative %: 49 %
PLATELETS: 142 10*3/uL (ref 140–400)
RBC: 3.92 MIL/uL — ABNORMAL LOW (ref 4.20–5.82)
RDW: 14.4 % (ref 11.0–14.6)
WBC Count: 4 10*3/uL (ref 4.0–10.3)

## 2017-09-10 MED ORDER — FLUOROURACIL CHEMO INJECTION 2.5 GM/50ML
300.0000 mg/m2 | Freq: Once | INTRAVENOUS | Status: AC
  Administered 2017-09-10: 700 mg via INTRAVENOUS
  Filled 2017-09-10: qty 14

## 2017-09-10 MED ORDER — SODIUM CHLORIDE 0.9 % IV SOLN
6.0000 mg/kg | Freq: Once | INTRAVENOUS | Status: AC
  Administered 2017-09-10: 600 mg via INTRAVENOUS
  Filled 2017-09-10: qty 30

## 2017-09-10 MED ORDER — PALONOSETRON HCL INJECTION 0.25 MG/5ML
0.2500 mg | Freq: Once | INTRAVENOUS | Status: AC
  Administered 2017-09-10: 0.25 mg via INTRAVENOUS

## 2017-09-10 MED ORDER — PALONOSETRON HCL INJECTION 0.25 MG/5ML
INTRAVENOUS | Status: AC
  Filled 2017-09-10: qty 5

## 2017-09-10 MED ORDER — SODIUM CHLORIDE 0.9% FLUSH
10.0000 mL | Freq: Once | INTRAVENOUS | Status: AC
  Administered 2017-09-10: 10 mL
  Filled 2017-09-10: qty 10

## 2017-09-10 MED ORDER — LEUCOVORIN CALCIUM INJECTION 350 MG
300.0000 mg/m2 | Freq: Once | INTRAMUSCULAR | Status: AC
  Administered 2017-09-10: 690 mg via INTRAVENOUS
  Filled 2017-09-10: qty 34.5

## 2017-09-10 MED ORDER — DEXAMETHASONE SODIUM PHOSPHATE 10 MG/ML IJ SOLN
INTRAMUSCULAR | Status: AC
  Filled 2017-09-10: qty 1

## 2017-09-10 MED ORDER — ATROPINE SULFATE 1 MG/ML IJ SOLN
0.5000 mg | Freq: Once | INTRAMUSCULAR | Status: AC | PRN
  Administered 2017-09-10: 0.5 mg via INTRAVENOUS

## 2017-09-10 MED ORDER — DEXAMETHASONE SODIUM PHOSPHATE 10 MG/ML IJ SOLN
10.0000 mg | Freq: Once | INTRAMUSCULAR | Status: AC
  Administered 2017-09-10: 10 mg via INTRAVENOUS

## 2017-09-10 MED ORDER — SODIUM CHLORIDE 0.9 % IV SOLN
Freq: Once | INTRAVENOUS | Status: AC
  Administered 2017-09-10: 10:00:00 via INTRAVENOUS

## 2017-09-10 MED ORDER — SODIUM CHLORIDE 0.9 % IV SOLN
1800.0000 mg/m2 | INTRAVENOUS | Status: DC
  Administered 2017-09-10: 4150 mg via INTRAVENOUS
  Filled 2017-09-10: qty 83

## 2017-09-10 MED ORDER — DEXTROSE 5 % IV SOLN
300.0000 mg | Freq: Once | INTRAVENOUS | Status: AC
  Administered 2017-09-10: 300 mg via INTRAVENOUS
  Filled 2017-09-10: qty 15

## 2017-09-10 NOTE — Patient Instructions (Signed)
[facility] Discharge Instructions for Patients Receiving Chemotherapy  Today you received the following chemotherapy agents: Vectibix, Irinotecan, Leucovorin, & Adrucil  To help prevent nausea and vomiting after your treatment, we encourage you to take your nausea medication as directed   If you develop nausea and vomiting that is not controlled by your nausea medication, call the clinic.   BELOW ARE SYMPTOMS THAT SHOULD BE REPORTED IMMEDIATELY:  *FEVER GREATER THAN 100.5 F  *CHILLS WITH OR WITHOUT FEVER  NAUSEA AND VOMITING THAT IS NOT CONTROLLED WITH YOUR NAUSEA MEDICATION  *UNUSUAL SHORTNESS OF BREATH  *UNUSUAL BRUISING OR BLEEDING  TENDERNESS IN MOUTH AND THROAT WITH OR WITHOUT PRESENCE OF ULCERS  *URINARY PROBLEMS  *BOWEL PROBLEMS  UNUSUAL RASH Items with * indicate a potential emergency and should be followed up as soon as possible.  Feel free to call the clinic should you have any questions or concerns. The clinic phone number is [phone].  Please show the CHEMO ALERT CARD at check-in to the Emergency Department and triage nurse.   

## 2017-09-10 NOTE — Telephone Encounter (Signed)
Appointments scheduled AVS/Calendar will be printed from infusion per 4/1 los

## 2017-09-10 NOTE — Patient Instructions (Signed)
Implanted Port Home Guide An implanted port is a type of central line that is placed under the skin. Central lines are used to provide IV access when treatment or nutrition needs to be given through a person's veins. Implanted ports are used for long-term IV access. An implanted port may be placed because:  You need IV medicine that would be irritating to the small veins in your hands or arms.  You need long-term IV medicines, such as antibiotics.  You need IV nutrition for a long period.  You need frequent blood draws for lab tests.  You need dialysis.  Implanted ports are usually placed in the chest area, but they can also be placed in the upper arm, the abdomen, or the leg. An implanted port has two main parts:  Reservoir. The reservoir is round and will appear as a small, raised area under your skin. The reservoir is the part where a needle is inserted to give medicines or draw blood.  Catheter. The catheter is a thin, flexible tube that extends from the reservoir. The catheter is placed into a large vein. Medicine that is inserted into the reservoir goes into the catheter and then into the vein.  How will I care for my incision site? Do not get the incision site wet. Bathe or shower as directed by your health care provider. How is my port accessed? Special steps must be taken to access the port:  Before the port is accessed, a numbing cream can be placed on the skin. This helps numb the skin over the port site.  Your health care provider uses a sterile technique to access the port. ? Your health care provider must put on a mask and sterile gloves. ? The skin over your port is cleaned carefully with an antiseptic and allowed to dry. ? The port is gently pinched between sterile gloves, and a needle is inserted into the port.  Only "non-coring" port needles should be used to access the port. Once the port is accessed, a blood return should be checked. This helps ensure that the port  is in the vein and is not clogged.  If your port needs to remain accessed for a constant infusion, a clear (transparent) bandage will be placed over the needle site. The bandage and needle will need to be changed every week, or as directed by your health care provider.  Keep the bandage covering the needle clean and dry. Do not get it wet. Follow your health care provider's instructions on how to take a shower or bath while the port is accessed.  If your port does not need to stay accessed, no bandage is needed over the port.  What is flushing? Flushing helps keep the port from getting clogged. Follow your health care provider's instructions on how and when to flush the port. Ports are usually flushed with saline solution or a medicine called heparin. The need for flushing will depend on how the port is used.  If the port is used for intermittent medicines or blood draws, the port will need to be flushed: ? After medicines have been given. ? After blood has been drawn. ? As part of routine maintenance.  If a constant infusion is running, the port may not need to be flushed.  How long will my port stay implanted? The port can stay in for as long as your health care provider thinks it is needed. When it is time for the port to come out, surgery will be   done to remove it. The procedure is similar to the one performed when the port was put in. When should I seek immediate medical care? When you have an implanted port, you should seek immediate medical care if:  You notice a bad smell coming from the incision site.  You have swelling, redness, or drainage at the incision site.  You have more swelling or pain at the port site or the surrounding area.  You have a fever that is not controlled with medicine.  This information is not intended to replace advice given to you by your health care provider. Make sure you discuss any questions you have with your health care provider. Document  Released: 05/29/2005 Document Revised: 11/04/2015 Document Reviewed: 02/03/2013 Elsevier Interactive Patient Education  2017 Elsevier Inc.  

## 2017-09-10 NOTE — Progress Notes (Signed)
Okay to use the last CMP for today's treatment per Lattie Haw T NP.

## 2017-09-10 NOTE — Progress Notes (Addendum)
[facility] OFFICE PROGRESS NOTE   Diagnosis: Rectal cancer  INTERVAL HISTORY:   Nicholas Suarez returns as scheduled.  He completed cycle 1 FOLFIRI/Panitumumab 08/20/2017.  Cycle 2 has been on hold due to mucositis.  He reports his mouth feels better.  He has very mild soreness.  He reports a good appetite.  He has noted some "cracks" in the skin on his hands and feet.  He notes his skin is dry.  He denies a skin rash.  No nausea or vomiting.  No diarrhea.  He continues to note swelling of the right leg.  Objective:  Vital signs in last 24 hours:  Blood pressure (!) 99/56, pulse 71, temperature 97.7 F (36.5 C), temperature source Oral, resp. rate 18, height '6\' 3"'  (1.905 m), weight 213 lb 6.4 oz (96.8 kg), SpO2 100 %.    HEENT: Healing ulcerations bilateral buccal mucosa. Resp: Lungs clear bilaterally. Cardio: Regular rate and rhythm. GI: Abdomen soft and nontender.  No hepatomegaly. Vascular: Trace edema right lower leg. Skin: Acne type rash over the trunk.  Scattered linear ulcerations at the fingertips. Port-A-Cath without erythema.   Lab Results:  Lab Results  Component Value Date   WBC 4.0 09/10/2017   HGB 14.5 08/14/2017   HCT 38.3 (L) 09/10/2017   MCV 97.7 09/10/2017   PLT 142 09/10/2017   NEUTROABS 2.0 09/10/2017    Imaging:  No results found.  Medications: I have reviewed the patient's current medications.  Assessment/Plan: 1. Rectal cancer  MRI lumbar spine 10/20/2016 with bilateral hydronephrosis and retroperitoneal adenopathy  CT abdomen/pelvis 10/20/2016 with extensive retroperitoneal process with marked interstitial thickening and lymphadenopathy; bilateral hydronephrosis; similar ill-defined soft tissue density and skin thickening around the umbilicus; diffuse bladder wall thickening slightly asymmetric at the bladder dome but no obvious bladder mass  Biopsy para-aortic lymph node 11/10/2016-metastatic adenocarcinoma consistent with GI  primary  11/23/2016 CEA elevated, CA-19-9 normal  Colonoscopy 11/27/2016-fungating infiltrative nonobstructing mass in the distal rectum involving the anal verge. The rectum was fixed and frozen in the pelvis precluding passage of scope beyond the distal sigmoid colon. 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  PET scan 06/22/2018showed minimal activity in the primary rectal carcinoma, diffuse wall thickening; mild activity associate with retroperitoneal lymph nodes; persistent haziness within the retroperitoneum. No evidence of liver metastasis or distant metastasis.  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  Cycle 2 FOLFOX 12/20/2016  Cycle 3 FOLFOX 01/03/2017 (oxaliplatin dose reduced secondary to thrombocytopenia)   Cycle 4 FOLFOX 01/17/2017 (oxaliplatin held due to thrombocytopenia)  Cycle 5 FOLFOX 02/15/2017  CTs 02/26/2017-new sclerotic lesions in the spine and right iliac, no residual adenopathy, persistent rectal wall thickening  Cycle 6 FOLFOX 03/01/2017 (Neulasta added, oxaliplatin further dose reduced)   Cycle 7 FOLFOX 03/15/2017 (oxaliplatin held secondary to thrombocytopenia)  Cycle 8 FOLFOX 03/29/2017  Cycle 9 FOLFOX 04/12/2017 (oxaliplatin held secondary to thrombocytopenia and neuropathy)  Cycle 10 FOLFOX 04/25/2017 (oxaliplatin held secondary to neuropathy)  CTs 05/07/2017-stable rectal thickening, stable periaortic soft tissue, stable sclerotic lesions at the lumbar spine and pelvis  Treatment changed to maintenance capecitabine beginning 05/14/2017  CTs 08/03/2017-irregular annular wall thickening  mid to lower rectum appears mildly increased; newly enlarged infiltrative retroperitoneal nodal metastasis; mild left supraclavicular adenopathy appears minimally increased;  ill-defined umbilical soft tissue thickening stable; small scattered sclerotic bone lesions stable.  Cycle 1 FOLFIRI/Panitumumab 08/20/2017  Cycle 2 FOLFIRI/Panitumumab 09/10/2017 (irinotecan and 5-FU dose reduced due to mucositis) 2. Retroperitoneal lymphadenopathy secondary to #1 3. Bilateral hydronephrosis status post evaluation by urology 4. Asymmetry of the bladder dome on CT 10/20/2016 status post cystoscopy with biopsy planned 12/11/2016 (Dr. 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 Change in bowel habits, secondary to #1-improved. 6. Right leg edema, question due to lymphatic obstruction;venous Doppler negative for DVT 11/23/2016. 7. Port-A-Cath placement 12/05/2016 8. Thrombocytopenia secondary to chemotherapy; oxaliplatin dose reduced with cycle 3 FOLFOX. Progressive thrombocytopenia 01/17/2017; oxaliplatin held 01/17/2017. 9. Right lower extremity cellulitis 01/27/2017-blood culture positive for methicillin sensitive staph aureus; Port-A-Cath removed. PICC line placed. TEE 01/30/2017 without evidence of vegetation. 14 days of Ancef recommended after Port-A-Cath removal with end date 02/14/2017. 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 Mucositis following cycle 1 FOLFIRI/Panitumumab-cycle 2 delayed and irinotecan/5-FU dose reduced.  Improved 09/10/2017.   Disposition: Nicholas Suarez appears stable.  He has completed 1 cycle of FOLFIRI/Panitumumab.  Cycle 2 has been on hold due to persistent mucositis.  The mouth ulcers appear to be healing.  Plan to proceed with cycle 2 FOLFIRI/Panitumumab today as scheduled with dose reductions of the Irinotecan and 5-fluorouracil.  He will return for lab, follow-up and cycle 3 of FOLFIRI/Panitumumab in 2  weeks.  He will contact the office in the interim with any problems.  Patient seen with Dr. Benay Spice.    Ned Card ANP/GNP-BC   09/10/2017  9:47 AM  This was a shared visit with Ned Card.  Nicholas Suarez was interviewed and examined.  He has healing ulcerations at the bilateral buccal mucosa.  The plan is to proceed with FOLFIRI/panitumumab today.  It is unclear whether the mucositis is related to one or a combination of the systemic agents.  I will dose reduce the 5-FU and irinotecan.  Julieanne Manson, MD

## 2017-09-12 ENCOUNTER — Inpatient Hospital Stay: Payer: Medicare Other

## 2017-09-12 VITALS — BP 107/71 | HR 81 | Temp 97.7°F | Resp 18

## 2017-09-12 DIAGNOSIS — C20 Malignant neoplasm of rectum: Secondary | ICD-10-CM

## 2017-09-12 DIAGNOSIS — Z5111 Encounter for antineoplastic chemotherapy: Secondary | ICD-10-CM | POA: Diagnosis not present

## 2017-09-12 MED ORDER — HEPARIN SOD (PORK) LOCK FLUSH 100 UNIT/ML IV SOLN
500.0000 [IU] | Freq: Once | INTRAVENOUS | Status: AC | PRN
  Administered 2017-09-12: 500 [IU]
  Filled 2017-09-12: qty 5

## 2017-09-12 MED ORDER — SODIUM CHLORIDE 0.9% FLUSH
10.0000 mL | INTRAVENOUS | Status: DC | PRN
  Administered 2017-09-12: 10 mL
  Filled 2017-09-12: qty 10

## 2017-09-12 NOTE — Patient Instructions (Signed)
Implanted Port Home Guide An implanted port is a type of central line that is placed under the skin. Central lines are used to provide IV access when treatment or nutrition needs to be given through a person's veins. Implanted ports are used for long-term IV access. An implanted port may be placed because:  You need IV medicine that would be irritating to the small veins in your hands or arms.  You need long-term IV medicines, such as antibiotics.  You need IV nutrition for a long period.  You need frequent blood draws for lab tests.  You need dialysis.  Implanted ports are usually placed in the chest area, but they can also be placed in the upper arm, the abdomen, or the leg. An implanted port has two main parts:  Reservoir. The reservoir is round and will appear as a small, raised area under your skin. The reservoir is the part where a needle is inserted to give medicines or draw blood.  Catheter. The catheter is a thin, flexible tube that extends from the reservoir. The catheter is placed into a large vein. Medicine that is inserted into the reservoir goes into the catheter and then into the vein.  How will I care for my incision site? Do not get the incision site wet. Bathe or shower as directed by your health care provider. How is my port accessed? Special steps must be taken to access the port:  Before the port is accessed, a numbing cream can be placed on the skin. This helps numb the skin over the port site.  Your health care provider uses a sterile technique to access the port. ? Your health care provider must put on a mask and sterile gloves. ? The skin over your port is cleaned carefully with an antiseptic and allowed to dry. ? The port is gently pinched between sterile gloves, and a needle is inserted into the port.  Only "non-coring" port needles should be used to access the port. Once the port is accessed, a blood return should be checked. This helps ensure that the port  is in the vein and is not clogged.  If your port needs to remain accessed for a constant infusion, a clear (transparent) bandage will be placed over the needle site. The bandage and needle will need to be changed every week, or as directed by your health care provider.  Keep the bandage covering the needle clean and dry. Do not get it wet. Follow your health care provider's instructions on how to take a shower or bath while the port is accessed.  If your port does not need to stay accessed, no bandage is needed over the port.  What is flushing? Flushing helps keep the port from getting clogged. Follow your health care provider's instructions on how and when to flush the port. Ports are usually flushed with saline solution or a medicine called heparin. The need for flushing will depend on how the port is used.  If the port is used for intermittent medicines or blood draws, the port will need to be flushed: ? After medicines have been given. ? After blood has been drawn. ? As part of routine maintenance.  If a constant infusion is running, the port may not need to be flushed.  How long will my port stay implanted? The port can stay in for as long as your health care provider thinks it is needed. When it is time for the port to come out, surgery will be   done to remove it. The procedure is similar to the one performed when the port was put in. When should I seek immediate medical care? When you have an implanted port, you should seek immediate medical care if:  You notice a bad smell coming from the incision site.  You have swelling, redness, or drainage at the incision site.  You have more swelling or pain at the port site or the surrounding area.  You have a fever that is not controlled with medicine.  This information is not intended to replace advice given to you by your health care provider. Make sure you discuss any questions you have with your health care provider. Document  Released: 05/29/2005 Document Revised: 11/04/2015 Document Reviewed: 02/03/2013 Elsevier Interactive Patient Education  2017 Elsevier Inc.  

## 2017-09-23 ENCOUNTER — Other Ambulatory Visit: Payer: Self-pay | Admitting: Oncology

## 2017-09-24 ENCOUNTER — Inpatient Hospital Stay: Payer: Medicare Other

## 2017-09-24 ENCOUNTER — Inpatient Hospital Stay (HOSPITAL_BASED_OUTPATIENT_CLINIC_OR_DEPARTMENT_OTHER): Payer: Medicare Other | Admitting: Oncology

## 2017-09-24 VITALS — BP 117/79 | HR 77 | Temp 97.6°F | Resp 17 | Ht 75.0 in | Wt 209.0 lb

## 2017-09-24 DIAGNOSIS — C20 Malignant neoplasm of rectum: Secondary | ICD-10-CM

## 2017-09-24 DIAGNOSIS — Z95828 Presence of other vascular implants and grafts: Secondary | ICD-10-CM

## 2017-09-24 DIAGNOSIS — C786 Secondary malignant neoplasm of retroperitoneum and peritoneum: Secondary | ICD-10-CM

## 2017-09-24 DIAGNOSIS — C674 Malignant neoplasm of posterior wall of bladder: Secondary | ICD-10-CM

## 2017-09-24 DIAGNOSIS — L98499 Non-pressure chronic ulcer of skin of other sites with unspecified severity: Secondary | ICD-10-CM | POA: Diagnosis not present

## 2017-09-24 DIAGNOSIS — K1231 Oral mucositis (ulcerative) due to antineoplastic therapy: Secondary | ICD-10-CM

## 2017-09-24 DIAGNOSIS — C770 Secondary and unspecified malignant neoplasm of lymph nodes of head, face and neck: Secondary | ICD-10-CM

## 2017-09-24 DIAGNOSIS — K1379 Other lesions of oral mucosa: Secondary | ICD-10-CM

## 2017-09-24 DIAGNOSIS — Z5111 Encounter for antineoplastic chemotherapy: Secondary | ICD-10-CM | POA: Diagnosis not present

## 2017-09-24 LAB — CBC WITH DIFFERENTIAL (CANCER CENTER ONLY)
BASOS ABS: 0 10*3/uL (ref 0.0–0.1)
BASOS PCT: 1 %
Eosinophils Absolute: 0.8 10*3/uL — ABNORMAL HIGH (ref 0.0–0.5)
Eosinophils Relative: 16 %
HEMATOCRIT: 38.9 % (ref 38.4–49.9)
HEMOGLOBIN: 13 g/dL (ref 13.0–17.1)
LYMPHS PCT: 19 %
Lymphs Abs: 1 10*3/uL (ref 0.9–3.3)
MCH: 31.9 pg (ref 27.2–33.4)
MCHC: 33.4 g/dL (ref 32.0–36.0)
MCV: 95.6 fL (ref 79.3–98.0)
Monocytes Absolute: 0.3 10*3/uL (ref 0.1–0.9)
Monocytes Relative: 5 %
NEUTROS ABS: 3.1 10*3/uL (ref 1.5–6.5)
NEUTROS PCT: 59 %
Platelet Count: 131 10*3/uL — ABNORMAL LOW (ref 140–400)
RBC: 4.07 MIL/uL — AB (ref 4.20–5.82)
RDW: 13.7 % (ref 11.0–14.6)
WBC: 5.1 10*3/uL (ref 4.0–10.3)

## 2017-09-24 LAB — CMP (CANCER CENTER ONLY)
ALBUMIN: 3.2 g/dL — AB (ref 3.5–5.0)
ALK PHOS: 100 U/L (ref 40–150)
ALT: 18 U/L (ref 0–55)
ANION GAP: 6 (ref 3–11)
AST: 20 U/L (ref 5–34)
BILIRUBIN TOTAL: 0.5 mg/dL (ref 0.2–1.2)
BUN: 13 mg/dL (ref 7–26)
CO2: 25 mmol/L (ref 22–29)
Calcium: 9.9 mg/dL (ref 8.4–10.4)
Chloride: 109 mmol/L (ref 98–109)
Creatinine: 0.82 mg/dL (ref 0.70–1.30)
GLUCOSE: 115 mg/dL (ref 70–140)
POTASSIUM: 4.1 mmol/L (ref 3.5–5.1)
Sodium: 140 mmol/L (ref 136–145)
TOTAL PROTEIN: 6.1 g/dL — AB (ref 6.4–8.3)

## 2017-09-24 LAB — CEA (IN HOUSE-CHCC): CEA (CHCC-In House): 481.52 ng/mL — ABNORMAL HIGH (ref 0.00–5.00)

## 2017-09-24 MED ORDER — SODIUM CHLORIDE 0.9 % IV SOLN
1800.0000 mg/m2 | INTRAVENOUS | Status: DC
  Administered 2017-09-24: 4150 mg via INTRAVENOUS
  Filled 2017-09-24: qty 83

## 2017-09-24 MED ORDER — SODIUM CHLORIDE 0.9 % IV SOLN
Freq: Once | INTRAVENOUS | Status: AC
  Administered 2017-09-24: 10:00:00 via INTRAVENOUS

## 2017-09-24 MED ORDER — SODIUM CHLORIDE 0.9 % IV SOLN
6.0000 mg/kg | Freq: Once | INTRAVENOUS | Status: AC
  Administered 2017-09-24: 600 mg via INTRAVENOUS
  Filled 2017-09-24: qty 20

## 2017-09-24 MED ORDER — FLUOROURACIL CHEMO INJECTION 2.5 GM/50ML
300.0000 mg/m2 | Freq: Once | INTRAVENOUS | Status: AC
  Administered 2017-09-24: 700 mg via INTRAVENOUS
  Filled 2017-09-24: qty 14

## 2017-09-24 MED ORDER — DEXAMETHASONE SODIUM PHOSPHATE 10 MG/ML IJ SOLN
INTRAMUSCULAR | Status: AC
  Filled 2017-09-24: qty 1

## 2017-09-24 MED ORDER — PALONOSETRON HCL INJECTION 0.25 MG/5ML
0.2500 mg | Freq: Once | INTRAVENOUS | Status: AC
  Administered 2017-09-24: 0.25 mg via INTRAVENOUS

## 2017-09-24 MED ORDER — DEXTROSE 5 % IV SOLN
300.0000 mg/m2 | Freq: Once | INTRAVENOUS | Status: AC
  Administered 2017-09-24: 690 mg via INTRAVENOUS
  Filled 2017-09-24: qty 34.5

## 2017-09-24 MED ORDER — SODIUM CHLORIDE 0.9% FLUSH
10.0000 mL | Freq: Once | INTRAVENOUS | Status: AC
  Administered 2017-09-24: 10 mL
  Filled 2017-09-24: qty 10

## 2017-09-24 MED ORDER — PALONOSETRON HCL INJECTION 0.25 MG/5ML
INTRAVENOUS | Status: AC
  Filled 2017-09-24: qty 5

## 2017-09-24 MED ORDER — ATROPINE SULFATE 1 MG/ML IJ SOLN
0.5000 mg | Freq: Once | INTRAMUSCULAR | Status: AC | PRN
  Administered 2017-09-24: 0.5 mg via INTRAVENOUS

## 2017-09-24 MED ORDER — DEXTROSE 5 % IV SOLN
134.0000 mg/m2 | Freq: Once | INTRAVENOUS | Status: AC
  Administered 2017-09-24: 300 mg via INTRAVENOUS
  Filled 2017-09-24: qty 15

## 2017-09-24 MED ORDER — ATROPINE SULFATE 1 MG/ML IJ SOLN
INTRAMUSCULAR | Status: AC
  Filled 2017-09-24: qty 1

## 2017-09-24 MED ORDER — DEXAMETHASONE SODIUM PHOSPHATE 10 MG/ML IJ SOLN
10.0000 mg | Freq: Once | INTRAMUSCULAR | Status: AC
  Administered 2017-09-24: 10 mg via INTRAVENOUS

## 2017-09-24 NOTE — Progress Notes (Signed)
[facility] OFFICE PROGRESS NOTE   Diagnosis: Rectal cancer  INTERVAL HISTORY:   Nicholas Suarez completed another cycle of FOLFIRI/panitumumab on 09/10/2017.  No nausea or diarrhea.  Mouth ulcers persist, but are less painful.  He is eating.  He reports abdominal discomfort after eating ice cream.  He has developed linear cracks at several fingers and at the heels.  He continues to have numbness in the extremities.  Objective:  Vital signs in last 24 hours:  Blood pressure 117/79, pulse 77, temperature 97.6 F (36.4 C), temperature source Oral, resp. rate 17, height '6\' 3"'  (1.905 m), weight 209 lb (94.8 kg), SpO2 98 %.    HEENT: No thrush.  Healing ulcerations at the buccal mucosa bilaterally. Lymphatics: Posterior and anterior left scalene nodes Resp: Lungs clear bilaterally Cardio: Regular rate and rhythm GI: No hepatosplenomegaly, nontender Vascular: No leg edema  Skin: Superficial linear cracks at the heels bilaterally, paronychia at several distal fingers, palms and soles without erythema, mild acne type rash over the face and trunk  Portacath/PICC-without erythema  Lab Results:  Lab Results  Component Value Date   WBC 5.1 09/24/2017   HGB 14.5 08/14/2017   HCT 38.9 09/24/2017   MCV 95.6 09/24/2017   PLT 131 (L) 09/24/2017   NEUTROABS 3.1 09/24/2017    CMP     Component Value Date/Time   NA 140 09/24/2017 0837   NA 140 06/06/2017 0813   K 4.1 09/24/2017 0837   K 4.5 06/06/2017 0813   CL 109 09/24/2017 0837   CO2 25 09/24/2017 0837   CO2 25 06/06/2017 0813   GLUCOSE 115 09/24/2017 0837   GLUCOSE 101 06/06/2017 0813   BUN 13 09/24/2017 0837   BUN 15.0 06/06/2017 0813   CREATININE 0.82 09/24/2017 0837   CREATININE 1.1 06/06/2017 0813   CALCIUM 9.9 09/24/2017 0837   CALCIUM 10.3 06/06/2017 0813   PROT 6.1 (L) 09/24/2017 0837   PROT 6.5 06/06/2017 0813   ALBUMIN 3.2 (L) 09/24/2017 0837   ALBUMIN 3.7 06/06/2017 0813   AST 20 09/24/2017 0837   AST 26 06/06/2017 0813   ALT 18 09/24/2017 0837   ALT 21 06/06/2017 0813   ALKPHOS 100 09/24/2017 0837   ALKPHOS 95 06/06/2017 0813   BILITOT 0.5 09/24/2017 0837   BILITOT 0.60 06/06/2017 0813   GFRNONAA >60 09/24/2017 0837   GFRAA >60 09/24/2017 0837    Lab Results  Component Value Date   CEA1 387.73 (H) 09/03/2017     Medications: I have reviewed the patient's current medications.   Assessment/Plan: 1. Rectal cancer  MRI lumbar spine 10/20/2016 with bilateral hydronephrosis and retroperitoneal adenopathy  CT abdomen/pelvis 10/20/2016 with extensive retroperitoneal process with marked interstitial thickening and lymphadenopathy; bilateral hydronephrosis; similar ill-defined soft tissue density and skin thickening around the umbilicus; diffuse bladder wall thickening slightly asymmetric at the bladder dome but no obvious bladder mass  Biopsy para-aortic lymph node 11/10/2016-metastatic adenocarcinoma consistent with GI primary  11/23/2016 CEA elevated, CA-19-9 normal  Colonoscopy 11/27/2016-fungating infiltrative nonobstructing mass in the distal rectum involving the anal verge. The rectum was fixed and frozen in the pelvis precluding passage of scope beyond the distal sigmoid colon. 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  PET scan 06/22/2018showed minimal activity in the primary rectal carcinoma, diffuse wall thickening; mild activity associate with retroperitoneal lymph nodes; persistent haziness within the retroperitoneum. No evidence of liver metastasis  or distant metastasis.  Cycle 1 FOLFOX 12/06/2016  Posterior bladder wall biopsy 12/11/2016-metastatic carcinoma, consistent with metastatic signet ring cell carcinoma  Biopsy left supraclavicular adenopathy 12/14/2016-metastatic signet ring cell  adenocarcinoma  Cycle 2 FOLFOX 12/20/2016  Cycle 3 FOLFOX 01/03/2017 (oxaliplatin dose reduced secondary to thrombocytopenia)   Cycle 4 FOLFOX 01/17/2017 (oxaliplatin held due to thrombocytopenia)  Cycle 5 FOLFOX 02/15/2017  CTs 02/26/2017-new sclerotic lesions in the spine and right iliac, no residual adenopathy, persistent rectal wall thickening  Cycle 6 FOLFOX 03/01/2017 (Neulasta added, oxaliplatin further dose reduced)   Cycle 7 FOLFOX 03/15/2017 (oxaliplatin held secondary to thrombocytopenia)  Cycle 8 FOLFOX 03/29/2017  Cycle 9 FOLFOX 04/12/2017 (oxaliplatin held secondary to thrombocytopenia and neuropathy)  Cycle 10 FOLFOX 04/25/2017 (oxaliplatin held secondary to neuropathy)  CTs 05/07/2017-stable rectal thickening, stable periaortic soft tissue, stable sclerotic lesions at the lumbar spine and pelvis  Treatment changed to maintenance capecitabine beginning 05/14/2017  CTs 08/03/2017-irregular annular wall thickening mid to lower rectum appears mildly increased; newly enlarged infiltrative retroperitoneal nodal metastasis; mild left supraclavicular adenopathy appears minimally increased; ill-defined umbilical soft tissue thickening stable; small scattered sclerotic bone lesions stable.  Cycle 1 FOLFIRI/Panitumumab 08/20/2017  Cycle 2 FOLFIRI/Panitumumab 09/10/2017 (irinotecan and 5-FU dose reduced due to mucositis)  Cycle 3 FOLFIRI/panitumumab 09/24/2017 2. Retroperitoneal lymphadenopathy secondary to #1 3. Bilateral hydronephrosis status post evaluation by urology 4. Asymmetry of the bladder dome on CT 10/20/2016 status post cystoscopy with biopsy planned 12/11/2016 (Dr. 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 Change in bowel habits, secondary to #1-improved. 6. Right leg edema, question due to lymphatic obstruction;venous Doppler negative for DVT 11/23/2016. 7. Port-A-Cath  placement 12/05/2016 8. Thrombocytopenia secondary to chemotherapy; oxaliplatin dose reduced with cycle 3 FOLFOX. Progressive thrombocytopenia 01/17/2017; oxaliplatin held 01/17/2017. 9. Right lower extremity cellulitis 01/27/2017-blood culture positive for methicillin sensitive staph aureus; Port-A-Cath removed. PICC line placed. TEE 01/30/2017 without evidence of vegetation. 14 days of Ancef recommended after Port-A-Cath removal with end date 02/14/2017. 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 Mucositis following cycle 1 FOLFIRI/Panitumumab-cycle 2 delayed and irinotecan/5-FU dose reduced.  Improved  Disposition: Nicholas Suarez has completed 2 cycles of FOLFIRI/panitumumab.  He has tolerated the treatment well other than mucositis.  He has persistent mouth ulcers that appear to be healing.  These are not painful.  He is eating.  The plan is to proceed with cycle 3 FOLFIRI/Panitumumab today.  We will follow-up on the CEA from today.  Nicholas Suarez will return for an office visit and chemotherapy in 2 weeks.  He will contact us in the interim as needed.  He continues on minocycline for prophylaxis of the panitumumab skin rash.  He will continue moisturizers on the areas of paronychia and linear skin ulcers.    Betsy Coder, MD  09/24/2017  9:40 AM

## 2017-09-24 NOTE — Progress Notes (Signed)
Per Dr Benay Spice ok to tx today using Mg labs from 09/03/2017

## 2017-09-24 NOTE — Patient Instructions (Signed)

## 2017-09-24 NOTE — Patient Instructions (Signed)
[facility] Discharge Instructions for Patients Receiving Chemotherapy  Today you received the following chemotherapy agents: Vectibix, Irinotecan, Leucovorin, & Adrucil  To help prevent nausea and vomiting after your treatment, we encourage you to take your nausea medication as directed   If you develop nausea and vomiting that is not controlled by your nausea medication, call the clinic.   BELOW ARE SYMPTOMS THAT SHOULD BE REPORTED IMMEDIATELY:  *FEVER GREATER THAN 100.5 F  *CHILLS WITH OR WITHOUT FEVER  NAUSEA AND VOMITING THAT IS NOT CONTROLLED WITH YOUR NAUSEA MEDICATION  *UNUSUAL SHORTNESS OF BREATH  *UNUSUAL BRUISING OR BLEEDING  TENDERNESS IN MOUTH AND THROAT WITH OR WITHOUT PRESENCE OF ULCERS  *URINARY PROBLEMS  *BOWEL PROBLEMS  UNUSUAL RASH Items with * indicate a potential emergency and should be followed up as soon as possible.  Feel free to call the clinic should you have any questions or concerns. The clinic phone number is [phone].  Please show the CHEMO ALERT CARD at check-in to the Emergency Department and triage nurse.   

## 2017-09-25 ENCOUNTER — Telehealth: Payer: Self-pay | Admitting: Emergency Medicine

## 2017-09-25 NOTE — Telephone Encounter (Addendum)
Pt verbalized understanding of this note.   ----- Message from Ladell Pier, MD sent at 09/24/2017  5:50 PM EDT ----- Please call patient, CEA is slightly higher, but lower than last month, plan to continue FOLFIRI/panitumumab and obtain a restaging CT after cycle 5

## 2017-09-26 ENCOUNTER — Inpatient Hospital Stay: Payer: Medicare Other

## 2017-09-26 VITALS — BP 110/76 | HR 78 | Temp 97.8°F | Resp 18

## 2017-09-26 DIAGNOSIS — Z5111 Encounter for antineoplastic chemotherapy: Secondary | ICD-10-CM | POA: Diagnosis not present

## 2017-09-26 DIAGNOSIS — C20 Malignant neoplasm of rectum: Secondary | ICD-10-CM

## 2017-09-26 MED ORDER — SODIUM CHLORIDE 0.9% FLUSH
10.0000 mL | INTRAVENOUS | Status: DC | PRN
  Administered 2017-09-26: 10 mL
  Filled 2017-09-26: qty 10

## 2017-09-26 MED ORDER — HEPARIN SOD (PORK) LOCK FLUSH 100 UNIT/ML IV SOLN
500.0000 [IU] | Freq: Once | INTRAVENOUS | Status: AC | PRN
  Administered 2017-09-26: 500 [IU]
  Filled 2017-09-26: qty 5

## 2017-10-07 ENCOUNTER — Other Ambulatory Visit: Payer: Self-pay | Admitting: Oncology

## 2017-10-08 ENCOUNTER — Inpatient Hospital Stay (HOSPITAL_BASED_OUTPATIENT_CLINIC_OR_DEPARTMENT_OTHER): Payer: Medicare Other | Admitting: Oncology

## 2017-10-08 ENCOUNTER — Inpatient Hospital Stay: Payer: Medicare Other

## 2017-10-08 ENCOUNTER — Telehealth: Payer: Self-pay | Admitting: Oncology

## 2017-10-08 VITALS — BP 98/66 | HR 69 | Temp 97.6°F | Resp 16 | Ht 75.0 in | Wt 208.3 lb

## 2017-10-08 DIAGNOSIS — C674 Malignant neoplasm of posterior wall of bladder: Secondary | ICD-10-CM

## 2017-10-08 DIAGNOSIS — C770 Secondary and unspecified malignant neoplasm of lymph nodes of head, face and neck: Secondary | ICD-10-CM | POA: Diagnosis not present

## 2017-10-08 DIAGNOSIS — K1379 Other lesions of oral mucosa: Secondary | ICD-10-CM

## 2017-10-08 DIAGNOSIS — K1231 Oral mucositis (ulcerative) due to antineoplastic therapy: Secondary | ICD-10-CM | POA: Diagnosis not present

## 2017-10-08 DIAGNOSIS — C20 Malignant neoplasm of rectum: Secondary | ICD-10-CM

## 2017-10-08 DIAGNOSIS — Z95828 Presence of other vascular implants and grafts: Secondary | ICD-10-CM

## 2017-10-08 DIAGNOSIS — C786 Secondary malignant neoplasm of retroperitoneum and peritoneum: Secondary | ICD-10-CM | POA: Diagnosis not present

## 2017-10-08 DIAGNOSIS — Z5111 Encounter for antineoplastic chemotherapy: Secondary | ICD-10-CM | POA: Diagnosis not present

## 2017-10-08 LAB — CMP (CANCER CENTER ONLY)
ALBUMIN: 3.2 g/dL — AB (ref 3.5–5.0)
ALK PHOS: 113 U/L (ref 40–150)
ALT: 18 U/L (ref 0–55)
AST: 22 U/L (ref 5–34)
Anion gap: 6 (ref 3–11)
BUN: 14 mg/dL (ref 7–26)
CALCIUM: 9.9 mg/dL (ref 8.4–10.4)
CHLORIDE: 110 mmol/L — AB (ref 98–109)
CO2: 25 mmol/L (ref 22–29)
CREATININE: 0.95 mg/dL (ref 0.70–1.30)
GFR, Est AFR Am: >60 mL/min (ref >60)
GFR, Estimated: >60 mL/min (ref >60)
GLUCOSE: 118 mg/dL (ref 70–140)
Potassium: 4.1 mmol/L (ref 3.5–5.1)
SODIUM: 141 mmol/L (ref 136–145)
Total Bilirubin: 0.4 mg/dL (ref 0.2–1.2)
Total Protein: 5.9 g/dL — ABNORMAL LOW (ref 6.4–8.3)

## 2017-10-08 LAB — CBC WITH DIFFERENTIAL (CANCER CENTER ONLY)
BASOS PCT: 1 %
Basophils Absolute: 0.1 10*3/uL (ref 0.0–0.1)
Eosinophils Absolute: 0.7 10*3/uL — ABNORMAL HIGH (ref 0.0–0.5)
Eosinophils Relative: 13 %
HEMATOCRIT: 37.7 % — AB (ref 38.4–49.9)
Hemoglobin: 12.5 g/dL — ABNORMAL LOW (ref 13.0–17.1)
Lymphocytes Relative: 17 %
Lymphs Abs: 0.9 10*3/uL (ref 0.9–3.3)
MCH: 31.7 pg (ref 27.2–33.4)
MCHC: 33.2 g/dL (ref 32.0–36.0)
MCV: 95.7 fL (ref 79.3–98.0)
MONO ABS: 0.3 10*3/uL (ref 0.1–0.9)
Monocytes Relative: 6 %
NEUTROS ABS: 3.3 10*3/uL (ref 1.5–6.5)
Neutrophils Relative %: 63 %
PLATELETS: 130 10*3/uL — AB (ref 140–400)
RBC: 3.94 MIL/uL — ABNORMAL LOW (ref 4.20–5.82)
RDW: 13.8 % (ref 11.0–14.6)
WBC Count: 5.2 10*3/uL (ref 4.0–10.3)

## 2017-10-08 LAB — MAGNESIUM: Magnesium: 1.5 mg/dL — ABNORMAL LOW (ref 1.7–2.4)

## 2017-10-08 MED ORDER — SODIUM CHLORIDE 0.9 % IV SOLN
Freq: Once | INTRAVENOUS | Status: AC
  Administered 2017-10-08: 10:00:00 via INTRAVENOUS

## 2017-10-08 MED ORDER — DEXAMETHASONE SODIUM PHOSPHATE 10 MG/ML IJ SOLN
10.0000 mg | Freq: Once | INTRAMUSCULAR | Status: AC
  Administered 2017-10-08: 10 mg via INTRAVENOUS

## 2017-10-08 MED ORDER — DEXAMETHASONE SODIUM PHOSPHATE 10 MG/ML IJ SOLN
INTRAMUSCULAR | Status: AC
  Filled 2017-10-08: qty 1

## 2017-10-08 MED ORDER — PALONOSETRON HCL INJECTION 0.25 MG/5ML
0.2500 mg | Freq: Once | INTRAVENOUS | Status: AC
  Administered 2017-10-08: 0.25 mg via INTRAVENOUS

## 2017-10-08 MED ORDER — SODIUM CHLORIDE 0.9 % IV SOLN
134.0000 mg/m2 | Freq: Once | INTRAVENOUS | Status: AC
  Administered 2017-10-08: 300 mg via INTRAVENOUS
  Filled 2017-10-08: qty 15

## 2017-10-08 MED ORDER — ATROPINE SULFATE 1 MG/ML IJ SOLN
INTRAMUSCULAR | Status: AC
  Filled 2017-10-08: qty 1

## 2017-10-08 MED ORDER — SODIUM CHLORIDE 0.9% FLUSH
10.0000 mL | Freq: Once | INTRAVENOUS | Status: AC
  Administered 2017-10-08: 10 mL
  Filled 2017-10-08: qty 10

## 2017-10-08 MED ORDER — PALONOSETRON HCL INJECTION 0.25 MG/5ML
INTRAVENOUS | Status: AC
  Filled 2017-10-08: qty 5

## 2017-10-08 MED ORDER — SODIUM CHLORIDE 0.9 % IV SOLN
6.0000 mg/kg | Freq: Once | INTRAVENOUS | Status: AC
  Administered 2017-10-08: 600 mg via INTRAVENOUS
  Filled 2017-10-08: qty 10

## 2017-10-08 MED ORDER — ATROPINE SULFATE 1 MG/ML IJ SOLN
0.5000 mg | Freq: Once | INTRAMUSCULAR | Status: AC | PRN
  Administered 2017-10-08: 0.5 mg via INTRAVENOUS

## 2017-10-08 MED ORDER — FLUOROURACIL CHEMO INJECTION 500 MG/10ML
200.0000 mg/m2 | Freq: Once | INTRAVENOUS | Status: AC
  Administered 2017-10-08: 450 mg via INTRAVENOUS
  Filled 2017-10-08: qty 9

## 2017-10-08 MED ORDER — SODIUM CHLORIDE 0.9 % IV SOLN
1600.0000 mg/m2 | INTRAVENOUS | Status: DC
  Administered 2017-10-08: 3700 mg via INTRAVENOUS
  Filled 2017-10-08: qty 74

## 2017-10-08 MED ORDER — SODIUM CHLORIDE 0.9 % IV SOLN
200.0000 mg/m2 | Freq: Once | INTRAVENOUS | Status: AC
  Administered 2017-10-08: 460 mg via INTRAVENOUS
  Filled 2017-10-08: qty 23

## 2017-10-08 NOTE — Progress Notes (Signed)
[facility] OFFICE PROGRESS NOTE   Diagnosis: Rectal cancer  INTERVAL HISTORY:   Nicholas Suarez returns as scheduled.  He completed another cycle of FOLFIRI/panitumumab on 09/24/2017.  He continues to have mouth ulcers.  He is tolerating a diet.  The ulcers did not progress following the most recent cycle of chemotherapy.  He had burning of the lips for several days following chemotherapy.  He continues to have linear ulcerations at the heels.  Good appetite and energy level.  Objective:  Vital signs in last 24 hours:  Blood pressure 98/66, pulse 69, temperature 97.6 F (36.4 C), temperature source Oral, resp. rate 16, height _0  (1.905 m), weight 208 lb 4.8 oz (94.5 kg), SpO2 100 %.    HEENT: Ulcerations in various stages of healing at the buccal mucosa, no thrush Resp: Lungs clear bilaterally Cardio: Regular rate and rhythm GI: No hepatomegaly, no mass, nontender Vascular: Trace edema at the right lower leg  Skin: Acne type rash over the trunk.  Linear ulcerations at the heels bilaterally.  Portacath/PICC-without erythema  Lab Results:  Lab Results  Component Value Date   WBC 5.2 10/08/2017   HGB 12.5 (L) 10/08/2017   HCT 37.7 (L) 10/08/2017   MCV 95.7 10/08/2017   PLT 130 (L) 10/08/2017   NEUTROABS 3.3 10/08/2017    CMP     Component Value Date/Time   NA 141 10/08/2017 0825   NA 140 06/06/2017 0813   K 4.1 10/08/2017 0825   K 4.5 06/06/2017 0813   CL 110 (H) 10/08/2017 0825   CO2 25 10/08/2017 0825   CO2 25 06/06/2017 0813   GLUCOSE 118 10/08/2017 0825   GLUCOSE 101 06/06/2017 0813   BUN 14 10/08/2017 0825   BUN 15.0 06/06/2017 0813   CREATININE 0.95 10/08/2017 0825   CREATININE 1.1 06/06/2017 0813   CALCIUM 9.9 10/08/2017 0825   CALCIUM 10.3 06/06/2017 0813   PROT 5.9 (L) 10/08/2017 0825   PROT 6.5 06/06/2017 0813   ALBUMIN 3.2 (L) 10/08/2017 0825   ALBUMIN 3.7 06/06/2017 0813   AST 22 10/08/2017 0825   AST 26 06/06/2017 0813   ALT 18  10/08/2017 0825   ALT 21 06/06/2017 0813   ALKPHOS 113 10/08/2017 0825   ALKPHOS 95 06/06/2017 0813   BILITOT 0.4 10/08/2017 0825   BILITOT 0.60 06/06/2017 0813   GFRNONAA >60 10/08/2017 0825   GFRAA >60 10/08/2017 0825    Lab Results  Component Value Date   CEA1 481.52 (H) 09/24/2017    Medications: I have reviewed the patient's current medications.   Assessment/Plan: 1. Rectal cancer  MRI lumbar spine 10/20/2016 with bilateral hydronephrosis and retroperitoneal adenopathy  CT abdomen/pelvis 10/20/2016 with extensive retroperitoneal process with marked interstitial thickening and lymphadenopathy; bilateral hydronephrosis; similar ill-defined soft tissue density and skin thickening around the umbilicus; diffuse bladder wall thickening slightly asymmetric at the bladder dome but no obvious bladder mass  Biopsy para-aortic lymph node 11/10/2016-metastatic adenocarcinoma consistent with GI primary  11/23/2016 CEA elevated, CA-19-9 normal  Colonoscopy 11/27/2016-fungating infiltrative nonobstructing mass in the distal rectum involving the anal verge. The rectum was fixed and frozen in the pelvis precluding passage of scope beyond the distal sigmoid colon. 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  PET scan 06/22/2018showed minimal activity in the primary rectal carcinoma, diffuse wall thickening; mild activity associate with retroperitoneal lymph nodes; persistent haziness within the retroperitoneum. No  evidence of liver metastasis or distant metastasis.  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  Cycle 2 FOLFOX  12/20/2016  Cycle 3 FOLFOX 01/03/2017 (oxaliplatin dose reduced secondary to thrombocytopenia)   Cycle 4 FOLFOX 01/17/2017 (oxaliplatin held due to thrombocytopenia)  Cycle 5 FOLFOX 02/15/2017  CTs 02/26/2017-new sclerotic lesions in the spine and right iliac, no residual adenopathy, persistent rectal wall thickening  Cycle 6 FOLFOX 03/01/2017 (Neulasta added, oxaliplatin further dose reduced)   Cycle 7 FOLFOX 03/15/2017 (oxaliplatin held secondary to thrombocytopenia)  Cycle 8 FOLFOX 03/29/2017  Cycle 9 FOLFOX 04/12/2017 (oxaliplatin held secondary to thrombocytopenia and neuropathy)  Cycle 10 FOLFOX 04/25/2017 (oxaliplatin held secondary to neuropathy)  CTs 05/07/2017-stable rectal thickening, stable periaortic soft tissue, stable sclerotic lesions at the lumbar spine and pelvis  Treatment changed to maintenance capecitabine beginning 05/14/2017  CTs 08/03/2017-irregular annular wall thickening mid to lower rectum appears mildly increased; newly enlarged infiltrative retroperitoneal nodal metastasis; mild left supraclavicular adenopathy appears minimally increased; ill-defined umbilical soft tissue thickening stable; small scattered sclerotic bone lesions stable.  Cycle 1 FOLFIRI/Panitumumab 08/20/2017  Cycle 2 FOLFIRI/Panitumumab 09/10/2017 (irinotecan and 5-FU dose reduced due to mucositis)  Cycle 3 FOLFIRI/panitumumab 09/24/2017  Cycle 4 FOLFIRI/panitumumab 10/08/2017 (5-fluorouracil further dose reduced secondary to mucositis) sees 2. Retroperitoneal lymphadenopathy secondary to #1 3. Bilateral hydronephrosis status post evaluation by urology 4. Asymmetry of the bladder dome on CT 10/20/2016 status post cystoscopy with biopsy planned 12/11/2016 (Dr. 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 Change in bowel habits, secondary to #1-improved. 6. Right leg edema, question due to lymphatic  obstruction;venous Doppler negative for DVT 11/23/2016. 7. Port-A-Cath placement 12/05/2016 8. Thrombocytopenia secondary to chemotherapy; oxaliplatin dose reduced with cycle 3 FOLFOX. Progressive thrombocytopenia 01/17/2017; oxaliplatin held 01/17/2017. 9. Right lower extremity cellulitis 01/27/2017-blood culture positive for methicillin sensitive staph aureus; Port-A-Cath removed. PICC line placed. TEE 01/30/2017 without evidence of vegetation. 14 days of Ancef recommended after Port-A-Cath removal with end date 02/14/2017. 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 Mucositis following cycle 1 FOLFIRI/Panitumumab-cycle 2 delayed andirinotecan/5-FU dose reduced.Improved  Disposition: Nicholas Suarez appears unchanged.  He has completed 3 cycles of salvage therapy with FOLFIRI/panitumumab.  He will complete cycle 4 today.  We dose reduce the 5-FU secondary to persistent mucositis.  He will return for an office visit and cycle 5 in 3 weeks.  He will undergo a restaging CT evaluation after cycle 5.  We will follow-up on the CEA from today.  25 minutes were spent with the patient today.  The majority of the time was used for counseling and coordination of care.  Betsy Coder, MD  10/08/2017  2:10 PM

## 2017-10-08 NOTE — Telephone Encounter (Signed)
Scheduled appt per 4/29 los . Gave patient aVS and calender per los.

## 2017-10-08 NOTE — Progress Notes (Signed)
Okay to treat with magnesium of 1.5 per Dr. Benay Spice.

## 2017-10-08 NOTE — Patient Instructions (Signed)
[facility] Discharge Instructions for Patients Receiving Chemotherapy  Today you received the following chemotherapy agents Irinotecan,leucovorin,Adrucil, Vectibix  To help prevent nausea and vomiting after your treatment, we encourage you to take your nausea medication as directed   If you develop nausea and vomiting that is not controlled by your nausea medication, call the clinic.   BELOW ARE SYMPTOMS THAT SHOULD BE REPORTED IMMEDIATELY:  *FEVER GREATER THAN 100.5 F  *CHILLS WITH OR WITHOUT FEVER  NAUSEA AND VOMITING THAT IS NOT CONTROLLED WITH YOUR NAUSEA MEDICATION  *UNUSUAL SHORTNESS OF BREATH  *UNUSUAL BRUISING OR BLEEDING  TENDERNESS IN MOUTH AND THROAT WITH OR WITHOUT PRESENCE OF ULCERS  *URINARY PROBLEMS  *BOWEL PROBLEMS  UNUSUAL RASH Items with * indicate a potential emergency and should be followed up as soon as possible.  Feel free to call the clinic should you have any questions or concerns. The clinic phone number is [phone].  Please show the [facility] at check-in to the Emergency Department and triage nurse.

## 2017-10-10 ENCOUNTER — Telehealth: Payer: Self-pay

## 2017-10-10 ENCOUNTER — Inpatient Hospital Stay: Payer: Medicare Other | Attending: Oncology

## 2017-10-10 VITALS — BP 106/72 | HR 87 | Temp 97.7°F | Resp 18

## 2017-10-10 DIAGNOSIS — C20 Malignant neoplasm of rectum: Secondary | ICD-10-CM | POA: Diagnosis present

## 2017-10-10 DIAGNOSIS — Z452 Encounter for adjustment and management of vascular access device: Secondary | ICD-10-CM | POA: Insufficient documentation

## 2017-10-10 DIAGNOSIS — Z5111 Encounter for antineoplastic chemotherapy: Secondary | ICD-10-CM | POA: Diagnosis present

## 2017-10-10 DIAGNOSIS — C674 Malignant neoplasm of posterior wall of bladder: Secondary | ICD-10-CM | POA: Insufficient documentation

## 2017-10-10 DIAGNOSIS — Z5112 Encounter for antineoplastic immunotherapy: Secondary | ICD-10-CM | POA: Insufficient documentation

## 2017-10-10 MED ORDER — HEPARIN SOD (PORK) LOCK FLUSH 100 UNIT/ML IV SOLN
500.0000 [IU] | Freq: Once | INTRAVENOUS | Status: AC | PRN
  Administered 2017-10-10: 500 [IU]
  Filled 2017-10-10: qty 5

## 2017-10-10 MED ORDER — SODIUM CHLORIDE 0.9% FLUSH
10.0000 mL | INTRAVENOUS | Status: DC | PRN
  Administered 2017-10-10: 10 mL
  Filled 2017-10-10: qty 10

## 2017-10-10 NOTE — Patient Instructions (Signed)
Implanted Port Home Guide An implanted port is a type of central line that is placed under the skin. Central lines are used to provide IV access when treatment or nutrition needs to be given through a person's veins. Implanted ports are used for long-term IV access. An implanted port may be placed because:  You need IV medicine that would be irritating to the small veins in your hands or arms.  You need long-term IV medicines, such as antibiotics.  You need IV nutrition for a long period.  You need frequent blood draws for lab tests.  You need dialysis.  Implanted ports are usually placed in the chest area, but they can also be placed in the upper arm, the abdomen, or the leg. An implanted port has two main parts:  Reservoir. The reservoir is round and will appear as a small, raised area under your skin. The reservoir is the part where a needle is inserted to give medicines or draw blood.  Catheter. The catheter is a thin, flexible tube that extends from the reservoir. The catheter is placed into a large vein. Medicine that is inserted into the reservoir goes into the catheter and then into the vein.  How will I care for my incision site? Do not get the incision site wet. Bathe or shower as directed by your health care provider. How is my port accessed? Special steps must be taken to access the port:  Before the port is accessed, a numbing cream can be placed on the skin. This helps numb the skin over the port site.  Your health care provider uses a sterile technique to access the port. ? Your health care provider must put on a mask and sterile gloves. ? The skin over your port is cleaned carefully with an antiseptic and allowed to dry. ? The port is gently pinched between sterile gloves, and a needle is inserted into the port.  Only "non-coring" port needles should be used to access the port. Once the port is accessed, a blood return should be checked. This helps ensure that the port  is in the vein and is not clogged.  If your port needs to remain accessed for a constant infusion, a clear (transparent) bandage will be placed over the needle site. The bandage and needle will need to be changed every week, or as directed by your health care provider.  Keep the bandage covering the needle clean and dry. Do not get it wet. Follow your health care provider's instructions on how to take a shower or bath while the port is accessed.  If your port does not need to stay accessed, no bandage is needed over the port.  What is flushing? Flushing helps keep the port from getting clogged. Follow your health care provider's instructions on how and when to flush the port. Ports are usually flushed with saline solution or a medicine called heparin. The need for flushing will depend on how the port is used.  If the port is used for intermittent medicines or blood draws, the port will need to be flushed: ? After medicines have been given. ? After blood has been drawn. ? As part of routine maintenance.  If a constant infusion is running, the port may not need to be flushed.  How long will my port stay implanted? The port can stay in for as long as your health care provider thinks it is needed. When it is time for the port to come out, surgery will be   done to remove it. The procedure is similar to the one performed when the port was put in. When should I seek immediate medical care? When you have an implanted port, you should seek immediate medical care if:  You notice a bad smell coming from the incision site.  You have swelling, redness, or drainage at the incision site.  You have more swelling or pain at the port site or the surrounding area.  You have a fever that is not controlled with medicine.  This information is not intended to replace advice given to you by your health care provider. Make sure you discuss any questions you have with your health care provider. Document  Released: 05/29/2005 Document Revised: 11/04/2015 Document Reviewed: 02/03/2013 Elsevier Interactive Patient Education  2017 Elsevier Inc.  

## 2017-10-10 NOTE — Telephone Encounter (Signed)
-----   Message from [name], NP sent at 10/10/2017  8:49 AM EDT ----- Please let him know magnesium level was low yesterday and have him start magnesium oxide 400 mg twice daily.

## 2017-10-10 NOTE — Telephone Encounter (Signed)
Spoke with pt in regards to lab results. Advised Per Nicholas Suarez. NP start taking magnesium oxide 400mg  two times a day. Pt verbalized understanding.

## 2017-10-28 ENCOUNTER — Other Ambulatory Visit: Payer: Self-pay | Admitting: Oncology

## 2017-10-29 ENCOUNTER — Inpatient Hospital Stay: Payer: Medicare Other

## 2017-10-29 ENCOUNTER — Telehealth: Payer: Self-pay | Admitting: Oncology

## 2017-10-29 ENCOUNTER — Inpatient Hospital Stay (HOSPITAL_BASED_OUTPATIENT_CLINIC_OR_DEPARTMENT_OTHER): Payer: Medicare Other | Admitting: Nurse Practitioner

## 2017-10-29 ENCOUNTER — Emergency Department (HOSPITAL_COMMUNITY)
Admission: EM | Admit: 2017-10-29 | Discharge: 2017-10-29 | Disposition: A | Discharge status: Home / Self Care | Payer: Medicare Other | Attending: Emergency Medicine | Admitting: Emergency Medicine

## 2017-10-29 ENCOUNTER — Encounter (HOSPITAL_COMMUNITY): Payer: Self-pay | Admitting: *Deleted

## 2017-10-29 VITALS — BP 113/68 | HR 77 | Temp 97.9°F | Resp 18 | Ht 75.0 in | Wt 211.9 lb

## 2017-10-29 DIAGNOSIS — C20 Malignant neoplasm of rectum: Secondary | ICD-10-CM

## 2017-10-29 DIAGNOSIS — Z5321 Procedure and treatment not carried out due to patient leaving prior to being seen by health care provider: Secondary | ICD-10-CM | POA: Diagnosis not present

## 2017-10-29 DIAGNOSIS — K1379 Other lesions of oral mucosa: Secondary | ICD-10-CM | POA: Diagnosis not present

## 2017-10-29 DIAGNOSIS — Z451 Encounter for adjustment and management of infusion pump: Secondary | ICD-10-CM | POA: Diagnosis present

## 2017-10-29 DIAGNOSIS — Z5112 Encounter for antineoplastic immunotherapy: Secondary | ICD-10-CM | POA: Diagnosis not present

## 2017-10-29 DIAGNOSIS — R197 Diarrhea, unspecified: Secondary | ICD-10-CM

## 2017-10-29 DIAGNOSIS — R140 Abdominal distension (gaseous): Secondary | ICD-10-CM

## 2017-10-29 DIAGNOSIS — C674 Malignant neoplasm of posterior wall of bladder: Secondary | ICD-10-CM | POA: Diagnosis not present

## 2017-10-29 DIAGNOSIS — R21 Rash and other nonspecific skin eruption: Secondary | ICD-10-CM

## 2017-10-29 DIAGNOSIS — Z95828 Presence of other vascular implants and grafts: Secondary | ICD-10-CM

## 2017-10-29 DIAGNOSIS — C7989 Secondary malignant neoplasm of other specified sites: Secondary | ICD-10-CM

## 2017-10-29 DIAGNOSIS — C786 Secondary malignant neoplasm of retroperitoneum and peritoneum: Secondary | ICD-10-CM

## 2017-10-29 LAB — CEA (IN HOUSE-CHCC): CEA (CHCC-In House): 1008.55 ng/mL — ABNORMAL HIGH (ref 0.00–5.00)

## 2017-10-29 LAB — CMP (CANCER CENTER ONLY)
ALBUMIN: 3.2 g/dL — AB (ref 3.5–5.0)
ALK PHOS: 121 U/L (ref 40–150)
ALT: 20 U/L (ref 0–55)
AST: 23 U/L (ref 5–34)
Anion gap: 5 (ref 3–11)
BILIRUBIN TOTAL: 0.6 mg/dL (ref 0.2–1.2)
BUN: 13 mg/dL (ref 7–26)
CALCIUM: 9.8 mg/dL (ref 8.4–10.4)
CO2: 26 mmol/L (ref 22–29)
Chloride: 109 mmol/L (ref 98–109)
Creatinine: 0.85 mg/dL (ref 0.70–1.30)
GFR, Est AFR Am: >60 mL/min (ref >60)
GLUCOSE: 135 mg/dL (ref 70–140)
Potassium: 4.3 mmol/L (ref 3.5–5.1)
Sodium: 140 mmol/L (ref 136–145)
TOTAL PROTEIN: 6.1 g/dL — AB (ref 6.4–8.3)

## 2017-10-29 LAB — CBC WITH DIFFERENTIAL (CANCER CENTER ONLY)
BASOS ABS: 0 10*3/uL (ref 0.0–0.1)
BASOS PCT: 1 %
EOS ABS: 0.5 10*3/uL (ref 0.0–0.5)
EOS PCT: 11 %
HCT: 38.3 % — ABNORMAL LOW (ref 38.4–49.9)
Hemoglobin: 12.7 g/dL — ABNORMAL LOW (ref 13.0–17.1)
Lymphocytes Relative: 16 %
Lymphs Abs: 0.7 10*3/uL — ABNORMAL LOW (ref 0.9–3.3)
MCH: 32 pg (ref 27.2–33.4)
MCHC: 33.3 g/dL (ref 32.0–36.0)
MCV: 96 fL (ref 79.3–98.0)
MONO ABS: 0.4 10*3/uL (ref 0.1–0.9)
Monocytes Relative: 9 %
Neutro Abs: 2.8 10*3/uL (ref 1.5–6.5)
Neutrophils Relative %: 63 %
PLATELETS: 140 10*3/uL (ref 140–400)
RBC: 3.99 MIL/uL — ABNORMAL LOW (ref 4.20–5.82)
RDW: 14.1 % (ref 11.0–14.6)
WBC: 4.3 10*3/uL (ref 4.0–10.3)

## 2017-10-29 LAB — MAGNESIUM: Magnesium: 1.6 mg/dL — ABNORMAL LOW (ref 1.7–2.4)

## 2017-10-29 MED ORDER — DEXAMETHASONE SODIUM PHOSPHATE 10 MG/ML IJ SOLN
INTRAMUSCULAR | Status: AC
  Filled 2017-10-29: qty 1

## 2017-10-29 MED ORDER — SODIUM CHLORIDE 0.9 % IV SOLN
1600.0000 mg/m2 | INTRAVENOUS | Status: DC
  Administered 2017-10-29: 3700 mg via INTRAVENOUS
  Filled 2017-10-29: qty 74

## 2017-10-29 MED ORDER — SODIUM CHLORIDE 0.9 % IV SOLN
6.0000 mg/kg | Freq: Once | INTRAVENOUS | Status: AC
  Administered 2017-10-29: 600 mg via INTRAVENOUS
  Filled 2017-10-29: qty 20

## 2017-10-29 MED ORDER — LEUCOVORIN CALCIUM INJECTION 350 MG
200.0000 mg/m2 | Freq: Once | INTRAVENOUS | Status: AC
  Administered 2017-10-29: 460 mg via INTRAVENOUS
  Filled 2017-10-29: qty 23

## 2017-10-29 MED ORDER — ATROPINE SULFATE 1 MG/ML IJ SOLN
0.5000 mg | Freq: Once | INTRAMUSCULAR | Status: AC | PRN
  Administered 2017-10-29: 0.5 mg via INTRAVENOUS

## 2017-10-29 MED ORDER — PALONOSETRON HCL INJECTION 0.25 MG/5ML
0.2500 mg | Freq: Once | INTRAVENOUS | Status: AC
  Administered 2017-10-29: 0.25 mg via INTRAVENOUS

## 2017-10-29 MED ORDER — SODIUM CHLORIDE 0.9 % IV SOLN
Freq: Once | INTRAVENOUS | Status: AC
  Administered 2017-10-29: 10:00:00 via INTRAVENOUS

## 2017-10-29 MED ORDER — FLUOROURACIL CHEMO INJECTION 500 MG/10ML
200.0000 mg/m2 | Freq: Once | INTRAVENOUS | Status: AC
  Administered 2017-10-29: 450 mg via INTRAVENOUS
  Filled 2017-10-29: qty 9

## 2017-10-29 MED ORDER — PALONOSETRON HCL INJECTION 0.25 MG/5ML
INTRAVENOUS | Status: AC
  Filled 2017-10-29: qty 5

## 2017-10-29 MED ORDER — ATROPINE SULFATE 1 MG/ML IJ SOLN
INTRAMUSCULAR | Status: AC
  Filled 2017-10-29: qty 1

## 2017-10-29 MED ORDER — IRINOTECAN HCL CHEMO INJECTION 100 MG/5ML
130.0000 mg/m2 | Freq: Once | INTRAVENOUS | Status: AC
  Administered 2017-10-29: 300 mg via INTRAVENOUS
  Filled 2017-10-29: qty 15

## 2017-10-29 MED ORDER — DEXAMETHASONE SODIUM PHOSPHATE 10 MG/ML IJ SOLN
10.0000 mg | Freq: Once | INTRAMUSCULAR | Status: AC
  Administered 2017-10-29: 10 mg via INTRAVENOUS

## 2017-10-29 MED ORDER — SODIUM CHLORIDE 0.9% FLUSH
10.0000 mL | Freq: Once | INTRAVENOUS | Status: AC
  Administered 2017-10-29: 10 mL
  Filled 2017-10-29: qty 10

## 2017-10-29 NOTE — Progress Notes (Signed)
[facility] OFFICE PROGRESS NOTE   Diagnosis:  Rectal cancer  INTERVAL HISTORY:   Nicholas Suarez returns as scheduled.  He completed cycle 4 FOLFIRI/Panitumumab 10/08/2017.  The past 2 weeks he has noted bloating/abdominal distention, especially after eating.  At times he feels like he will vomit.  He has periodic diarrhea.  No change in the mouth sores.  Rash is stable.  He continues to have intermittent swelling of the right leg.  Objective:  Vital signs in last 24 hours:  Blood pressure 113/68, pulse 77, temperature 97.9 F (36.6 C), temperature source Oral, resp. rate 18, height _0  (1.905 m), weight 211 lb 14.4 oz (96.1 kg), SpO2 97 %.    HEENT: Bilateral buccal regions with ulceration.  No thrush. Resp: Lungs clear bilaterally. Cardio: Regular rate and rhythm. GI: Abdomen is soft, mildly distended.  No apparent ascites.  No hepatomegaly. Vascular: Pitting edema at the right lower leg. Neuro: Alert and oriented. Skin: Acne type rash over the trunk.  Linear breaks in the skin involving multiple fingers. Port-A-Cath without erythema.   Lab Results:  Lab Results  Component Value Date   WBC 4.3 10/29/2017   HGB 12.7 (L) 10/29/2017   HCT 38.3 (L) 10/29/2017   MCV 96.0 10/29/2017   PLT 140 10/29/2017   NEUTROABS 2.8 10/29/2017    Imaging:  No results found.  Medications: I have reviewed the patient's current medications.  Assessment/Plan: 1. Rectal cancer  MRI lumbar spine 10/20/2016 with bilateral hydronephrosis and retroperitoneal adenopathy  CT abdomen/pelvis 10/20/2016 with extensive retroperitoneal process with marked interstitial thickening and lymphadenopathy; bilateral hydronephrosis; similar ill-defined soft tissue density and skin thickening around the umbilicus; diffuse bladder wall thickening slightly asymmetric at the bladder dome but no obvious bladder mass  Biopsy para-aortic lymph node 11/10/2016-metastatic adenocarcinoma consistent  with GI primary  11/23/2016 CEA elevated, CA-19-9 normal  Colonoscopy 11/27/2016-fungating infiltrative nonobstructing mass in the distal rectum involving the anal verge. The rectum was fixed and frozen in the pelvis precluding passage of scope beyond the distal sigmoid colon. 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  PET scan 06/22/2018showed minimal activity in the primary rectal carcinoma, diffuse wall thickening; mild activity associate with retroperitoneal lymph nodes; persistent haziness within the retroperitoneum. No evidence of liver metastasis or distant metastasis.  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  Cycle 2 FOLFOX 12/20/2016  Cycle 3 FOLFOX 01/03/2017 (oxaliplatin dose reduced secondary to thrombocytopenia)   Cycle 4 FOLFOX 01/17/2017 (oxaliplatin held due to thrombocytopenia)  Cycle 5 FOLFOX 02/15/2017  CTs 02/26/2017-new sclerotic lesions in the spine and right iliac, no residual adenopathy, persistent rectal wall thickening  Cycle 6 FOLFOX 03/01/2017 (Neulasta added, oxaliplatin further dose reduced)   Cycle 7 FOLFOX 03/15/2017 (oxaliplatin held secondary to thrombocytopenia)  Cycle 8 FOLFOX 03/29/2017  Cycle 9 FOLFOX 04/12/2017 (oxaliplatin held secondary to thrombocytopenia and neuropathy)  Cycle 10 FOLFOX 04/25/2017 (oxaliplatin held secondary to neuropathy)  CTs 05/07/2017-stable rectal thickening, stable periaortic soft tissue, stable sclerotic lesions at the lumbar spine and pelvis  Treatment changed to maintenance capecitabine beginning 05/14/2017  CTs 08/03/2017-irregular annular wall  thickening mid to lower rectum appears mildly increased; newly enlarged infiltrative retroperitoneal nodal metastasis; mild left supraclavicular adenopathy appears minimally increased; ill-defined umbilical soft tissue thickening stable; small scattered  sclerotic bone lesions stable.  Cycle 1 FOLFIRI/Panitumumab 08/20/2017  Cycle 2 FOLFIRI/Panitumumab 09/10/2017 (irinotecan and 5-FU dose reduced due to mucositis)  Cycle 3 FOLFIRI/panitumumab 09/24/2017  Cycle 4 FOLFIRI/panitumumab 10/08/2017 (5-fluorouracil further dose reduced secondary to mucositis)   Cycle 5 FOLFIRI/Panitumumab 10/29/2017 2. Retroperitoneal lymphadenopathy secondary to #1 3. Bilateral hydronephrosis status post evaluation by urology 4. Asymmetry of the bladder dome on CT 10/20/2016 status post cystoscopy with biopsy planned 12/11/2016 (Dr. 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 Change in bowel habits, secondary to #1-improved. 6. Right leg edema, question due to lymphatic obstruction;venous Doppler negative for DVT 11/23/2016. 7. Port-A-Cath placement 12/05/2016 8. Thrombocytopenia secondary to chemotherapy; oxaliplatin dose reduced with cycle 3 FOLFOX. Progressive thrombocytopenia 01/17/2017; oxaliplatin held 01/17/2017. 9. Right lower extremity cellulitis 01/27/2017-blood culture positive for methicillin sensitive staph aureus; Port-A-Cath removed. PICC line placed. TEE 01/30/2017 without evidence of vegetation. 14 days of Ancef recommended after Port-A-Cath removal with end date 02/14/2017. 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 Mucositis following cycle 1 FOLFIRI/Panitumumab-cycle 2 delayed andirinotecan/5-FU dose reduced.Improved     Disposition: [name] appears unchanged.  He has completed 4 cycles of FOLFIRI/Panitumumab.  Plan to proceed with cycle 5 today as scheduled.  The mouth ulcers are stable.  He understands to  contact the office with difficulty with oral intake.  He is having intermittent bloating/abdominal distention.  He will try more frequent, smaller meals.  He will undergo restaging CT scans 11/09/2017.  He will return for a follow-up visit on 11/12/2017.  He will contact the office in the interim with any problems.  Plan reviewed with Dr. Benay Spice.    Ned Card ANP/GNP-BC   10/29/2017  9:35 AM

## 2017-10-29 NOTE — Patient Instructions (Signed)
[facility] Discharge Instructions for Patients Receiving Chemotherapy  Today you received the following chemotherapy agents: Vectibix, Irinotecan, Leucovorin, & Adrucil  To help prevent nausea and vomiting after your treatment, we encourage you to take your nausea medication as directed   If you develop nausea and vomiting that is not controlled by your nausea medication, call the clinic.   BELOW ARE SYMPTOMS THAT SHOULD BE REPORTED IMMEDIATELY:  *FEVER GREATER THAN 100.5 F  *CHILLS WITH OR WITHOUT FEVER  NAUSEA AND VOMITING THAT IS NOT CONTROLLED WITH YOUR NAUSEA MEDICATION  *UNUSUAL SHORTNESS OF BREATH  *UNUSUAL BRUISING OR BLEEDING  TENDERNESS IN MOUTH AND THROAT WITH OR WITHOUT PRESENCE OF ULCERS  *URINARY PROBLEMS  *BOWEL PROBLEMS  UNUSUAL RASH Items with * indicate a potential emergency and should be followed up as soon as possible.  Feel free to call the clinic should you have any questions or concerns. The clinic phone number is [phone].  Please show the CHEMO ALERT CARD at check-in to the Emergency Department and triage nurse.   

## 2017-10-29 NOTE — Telephone Encounter (Signed)
Appointments scheduled Avs/Calendar printed per 5/20 los

## 2017-10-29 NOTE — ED Triage Notes (Signed)
Pt states he knocked his chemo pump on the ground and believes he saw a leak around the pump. Pt was advised to come to ED to evaluate the pump. Pt denies other complaints. No leak noted around pump in triage.

## 2017-10-31 ENCOUNTER — Inpatient Hospital Stay: Payer: Medicare Other

## 2017-10-31 VITALS — BP 120/80 | HR 80 | Temp 97.8°F | Resp 18

## 2017-10-31 DIAGNOSIS — C20 Malignant neoplasm of rectum: Secondary | ICD-10-CM

## 2017-10-31 DIAGNOSIS — Z5112 Encounter for antineoplastic immunotherapy: Secondary | ICD-10-CM | POA: Diagnosis not present

## 2017-10-31 MED ORDER — SODIUM CHLORIDE 0.9% FLUSH
10.0000 mL | INTRAVENOUS | Status: DC | PRN
  Administered 2017-10-31: 10 mL
  Filled 2017-10-31: qty 10

## 2017-10-31 MED ORDER — HEPARIN SOD (PORK) LOCK FLUSH 100 UNIT/ML IV SOLN
500.0000 [IU] | Freq: Once | INTRAVENOUS | Status: AC | PRN
  Administered 2017-10-31: 500 [IU]
  Filled 2017-10-31: qty 5

## 2017-11-01 ENCOUNTER — Encounter: Payer: Self-pay | Admitting: Nurse Practitioner

## 2017-11-02 MED ORDER — OCTREOTIDE ACETATE 30 MG IM KIT
PACK | INTRAMUSCULAR | Status: AC
  Filled 2017-11-02: qty 1

## 2017-11-05 ENCOUNTER — Other Ambulatory Visit: Payer: Self-pay | Admitting: Oncology

## 2017-11-09 ENCOUNTER — Ambulatory Visit (HOSPITAL_COMMUNITY)
Admission: RE | Admit: 2017-11-09 | Discharge: 2017-11-09 | Disposition: A | Discharge status: Home / Self Care | Payer: Medicare Other | Source: Ambulatory Visit | Attending: Nurse Practitioner | Admitting: Nurse Practitioner

## 2017-11-09 ENCOUNTER — Encounter (HOSPITAL_COMMUNITY): Payer: Self-pay

## 2017-11-09 ENCOUNTER — Other Ambulatory Visit: Payer: Self-pay | Admitting: *Deleted

## 2017-11-09 DIAGNOSIS — I700 Atherosclerosis of aorta: Secondary | ICD-10-CM | POA: Diagnosis not present

## 2017-11-09 DIAGNOSIS — C20 Malignant neoplasm of rectum: Secondary | ICD-10-CM

## 2017-11-09 DIAGNOSIS — R590 Localized enlarged lymph nodes: Secondary | ICD-10-CM | POA: Diagnosis not present

## 2017-11-09 DIAGNOSIS — M899 Disorder of bone, unspecified: Secondary | ICD-10-CM | POA: Insufficient documentation

## 2017-11-09 DIAGNOSIS — N3289 Other specified disorders of bladder: Secondary | ICD-10-CM | POA: Insufficient documentation

## 2017-11-09 MED ORDER — IOPAMIDOL (ISOVUE-300) INJECTION 61%
INTRAVENOUS | Status: AC
  Filled 2017-11-09: qty 100

## 2017-11-09 MED ORDER — IOPAMIDOL (ISOVUE-300) INJECTION 61%
100.0000 mL | Freq: Once | INTRAVENOUS | Status: AC | PRN
  Administered 2017-11-09: 100 mL via INTRAVENOUS

## 2017-11-12 ENCOUNTER — Telehealth: Payer: Self-pay

## 2017-11-12 ENCOUNTER — Inpatient Hospital Stay: Payer: Medicare Other | Attending: Oncology | Admitting: Oncology

## 2017-11-12 ENCOUNTER — Telehealth: Payer: Self-pay | Admitting: Oncology

## 2017-11-12 ENCOUNTER — Inpatient Hospital Stay: Payer: Medicare Other

## 2017-11-12 VITALS — BP 105/66 | HR 77 | Temp 97.6°F | Resp 17 | Ht 75.0 in | Wt 206.1 lb

## 2017-11-12 DIAGNOSIS — G629 Polyneuropathy, unspecified: Secondary | ICD-10-CM | POA: Insufficient documentation

## 2017-11-12 DIAGNOSIS — K5900 Constipation, unspecified: Secondary | ICD-10-CM | POA: Diagnosis not present

## 2017-11-12 DIAGNOSIS — Z5111 Encounter for antineoplastic chemotherapy: Secondary | ICD-10-CM | POA: Diagnosis present

## 2017-11-12 DIAGNOSIS — R109 Unspecified abdominal pain: Secondary | ICD-10-CM | POA: Insufficient documentation

## 2017-11-12 DIAGNOSIS — C7989 Secondary malignant neoplasm of other specified sites: Secondary | ICD-10-CM | POA: Diagnosis not present

## 2017-11-12 DIAGNOSIS — C20 Malignant neoplasm of rectum: Secondary | ICD-10-CM

## 2017-11-12 DIAGNOSIS — R194 Change in bowel habit: Secondary | ICD-10-CM | POA: Diagnosis not present

## 2017-11-12 DIAGNOSIS — C786 Secondary malignant neoplasm of retroperitoneum and peritoneum: Secondary | ICD-10-CM | POA: Insufficient documentation

## 2017-11-12 DIAGNOSIS — R609 Edema, unspecified: Secondary | ICD-10-CM | POA: Diagnosis not present

## 2017-11-12 DIAGNOSIS — K1379 Other lesions of oral mucosa: Secondary | ICD-10-CM | POA: Diagnosis not present

## 2017-11-12 DIAGNOSIS — C674 Malignant neoplasm of posterior wall of bladder: Secondary | ICD-10-CM | POA: Insufficient documentation

## 2017-11-12 DIAGNOSIS — Z95828 Presence of other vascular implants and grafts: Secondary | ICD-10-CM

## 2017-11-12 DIAGNOSIS — R911 Solitary pulmonary nodule: Secondary | ICD-10-CM | POA: Diagnosis not present

## 2017-11-12 LAB — CBC WITH DIFFERENTIAL (CANCER CENTER ONLY)
BASOS ABS: 0 10*3/uL (ref 0.0–0.1)
BASOS PCT: 0 %
EOS ABS: 0.4 10*3/uL (ref 0.0–0.5)
EOS PCT: 7 %
HCT: 38.9 % (ref 38.4–49.9)
HEMOGLOBIN: 12.9 g/dL — AB (ref 13.0–17.1)
Lymphocytes Relative: 13 %
Lymphs Abs: 0.8 10*3/uL — ABNORMAL LOW (ref 0.9–3.3)
MCH: 31.7 pg (ref 27.2–33.4)
MCHC: 33.2 g/dL (ref 32.0–36.0)
MCV: 95.6 fL (ref 79.3–98.0)
Monocytes Absolute: 0.4 10*3/uL (ref 0.1–0.9)
Monocytes Relative: 6 %
NEUTROS PCT: 74 %
Neutro Abs: 4.6 10*3/uL (ref 1.5–6.5)
PLATELETS: 156 10*3/uL (ref 140–400)
RBC: 4.07 MIL/uL — AB (ref 4.20–5.82)
RDW: 13.5 % (ref 11.0–14.6)
WBC: 6.2 10*3/uL (ref 4.0–10.3)

## 2017-11-12 LAB — CMP (CANCER CENTER ONLY)
ALK PHOS: 118 U/L (ref 40–150)
ALT: 13 U/L (ref 0–55)
AST: 18 U/L (ref 5–34)
Albumin: 3.3 g/dL — ABNORMAL LOW (ref 3.5–5.0)
Anion gap: 7 (ref 3–11)
BILIRUBIN TOTAL: 0.4 mg/dL (ref 0.2–1.2)
BUN: 17 mg/dL (ref 7–26)
CALCIUM: 9.7 mg/dL (ref 8.4–10.4)
CO2: 25 mmol/L (ref 22–29)
Chloride: 109 mmol/L (ref 98–109)
Creatinine: 0.86 mg/dL (ref 0.70–1.30)
GLUCOSE: 136 mg/dL (ref 70–140)
Potassium: 4.2 mmol/L (ref 3.5–5.1)
Sodium: 141 mmol/L (ref 136–145)
TOTAL PROTEIN: 6.2 g/dL — AB (ref 6.4–8.3)

## 2017-11-12 LAB — MAGNESIUM: MAGNESIUM: 1.4 mg/dL — AB (ref 1.7–2.4)

## 2017-11-12 LAB — CEA (IN HOUSE-CHCC): CEA (CHCC-IN HOUSE): 1395.99 ng/mL — AB (ref 0.00–5.00)

## 2017-11-12 MED ORDER — HEPARIN SOD (PORK) LOCK FLUSH 100 UNIT/ML IV SOLN
250.0000 [IU] | Freq: Once | INTRAVENOUS | Status: AC
  Administered 2017-11-12: 500 [IU]
  Filled 2017-11-12: qty 5

## 2017-11-12 MED ORDER — SODIUM CHLORIDE 0.9% FLUSH
10.0000 mL | Freq: Once | INTRAVENOUS | Status: AC
  Administered 2017-11-12: 10 mL
  Filled 2017-11-12: qty 10

## 2017-11-12 NOTE — Progress Notes (Signed)
Panic magnesium of 1.4 reported by Rosann Auerbach from lab. MD made aware

## 2017-11-12 NOTE — Telephone Encounter (Signed)
Per 6/3 los completed by MX. Avs and calender printed by AR.

## 2017-11-12 NOTE — Telephone Encounter (Signed)
Scheduled appt per 6/3 los - gave patient aVS and calender per los.  

## 2017-11-12 NOTE — Progress Notes (Signed)
[facility] OFFICE PROGRESS NOTE   Diagnosis: Rectal cancer  INTERVAL HISTORY:   Nicholas Suarez returns as scheduled.  He completed another treatment with FOLFIRI and Panitumumab on 10/29/2017.  He continues to have mouth ulcers, but these are worse and do not interfere with eating.  He has developed a "infection "at the right great toe.  He has noted new nodules at the upper chest.  He has irregular bowel habits.  He reports constipation for several days followed by diarrhea.  Objective:  Vital signs in last 24 hours:  Blood pressure 105/66, pulse 77, temperature 97.6 F (36.4 C), temperature source Oral, resp. rate 17, height _0  (1.905 m), weight 206 lb 1.6 oz (93.5 kg), SpO2 100 %.    HEENT: Ulcerations at the right greater than left buccal mucosa.  No thrush Resp: Lungs clear bilaterally Cardio: Regular rate and rhythm GI: No hepatosplenomegaly, no mass, nontender Vascular: No leg edema  Skin: Acne type rash over the trunk, paronychia at the right great toe, cutaneous nodular masses at the right upper anterior chest and left lower neck  Portacath/PICC-without erythema  Lab Results:  Lab Results  Component Value Date   WBC 6.2 11/12/2017   HGB 12.9 (L) 11/12/2017   HCT 38.9 11/12/2017   MCV 95.6 11/12/2017   PLT 156 11/12/2017   NEUTROABS 4.6 11/12/2017    CMP  Lab Results  Component Value Date   NA 140 10/29/2017   K 4.3 10/29/2017   CL 109 10/29/2017   CO2 26 10/29/2017   GLUCOSE 135 10/29/2017   BUN 13 10/29/2017   CREATININE 0.85 10/29/2017   CALCIUM 9.8 10/29/2017   PROT 6.1 (L) 10/29/2017   ALBUMIN 3.2 (L) 10/29/2017   AST 23 10/29/2017   ALT 20 10/29/2017   ALKPHOS 121 10/29/2017   BILITOT 0.6 10/29/2017   GFRNONAA >60 10/29/2017   GFRAA >60 10/29/2017    Lab Results  Component Value Date   CEA1 1,008.55 (H) 10/29/2017    Lab Results  Component Value Date   INR 1.04 08/14/2017    Imaging:  Ct Chest W Contrast  Result  Date: 11/09/2017 CLINICAL DATA:  Patient with history of rectal cancer. Follow-up evaluation. EXAM: CT CHEST, ABDOMEN, AND PELVIS WITH CONTRAST TECHNIQUE: Multidetector CT imaging of the chest, abdomen and pelvis was performed following the standard protocol during bolus administration of intravenous contrast. CONTRAST:  158m ISOVUE-300 IOPAMIDOL (ISOVUE-300) INJECTION 61% COMPARISON:  CT CAP 08/03/2017. FINDINGS: CT CHEST FINDINGS Cardiovascular: Right anterior chest wall Port-A-Cath is present with tip terminating in the superior vena cava. Normal heart size. Trace fluid superior pericardial recess. Thoracic aortic vascular calcifications. Mediastinum/Nodes: Interval increase in size of prevascular lymph node measuring 9 mm (image 21; series 2), previously 4 mm. Increased subcarinal lymph node measuring 11 mm (image 28; series 2), previously 8 mm. Increased superior mediastinal lymph node measuring 10 mm (image 17; series 2), previously 6 mm. Esophagus is normal in appearance. Increased left supraclavicular lymph node measuring 12 mm, previously 10 mm (image 4; series 2). Lungs/Pleura: Central airways are patent. Dependent atelectasis within the bilateral lower lobes. No large area pulmonary consolidation. No discrete pulmonary nodule. No pleural effusion or pneumothorax. Musculoskeletal: Stable small sclerotic lesion within the lateral right fifth rib (image 84; series 4). Stable small lesion inferior right scapula (image 63; series 4). Stable small sclerotic lesions within the T8 and T11 vertebral bodies. New 10 mm sclerotic lesion within the T7 vertebral body (image 19; series 6).  New sclerotic lesion within the T1 vertebral body (image 112; series 6). CT ABDOMEN PELVIS FINDINGS Hepatobiliary: Liver is normal in size and contour. No focal lesion identified. Gallbladder is unremarkable. No intrahepatic or extrahepatic biliary ductal dilatation. Pancreas: Unremarkable Spleen: Unremarkable Adrenals/Urinary Tract:  Adrenal glands are normal. Kidneys enhance symmetrically with contrast. Read demonstrated cyst interpolar region left kidney. There is a 10 mm enhancing nodule versus focal wall thickening involving the bladder dome (image 54; series 5). Stomach/Bowel: Increased circumferential wall thickening of the rectum (image 126; series 2). Stool throughout the colon. Normal morphology of the stomach. No evidence for small bowel obstruction. Small amount of free fluid throughout the abdomen. Interval development of bulky small bowel mesenteric adenopathy measuring up to 4.8 x 5.7 cm (image 88; series 2). Vascular/Lymphatic: Normal caliber abdominal aorta. Peripheral calcified atherosclerotic plaque. Significant interval increase in bulky retroperitoneal adenopathy predominately within the upper abdomen about the celiac axis and superior mesenteric arteries. Left periaortic nodal conglomerate measures 4.3 x 4.5 cm (image 68; series 2), previously 1.3 x 1.4 cm. Adenopathy extends inferiorly along the course of the aorta. Increased upper abdominal adenopathy including a 1.9 cm lymph node adjacent to the spleen (image 59; series 2), previously 0.7 cm. Reproductive: Prostate is unremarkable. Other: None. Musculoskeletal: Similar small sclerotic lesions inferior L3 vertebral body (image 107; series 6). IMPRESSION: 1. Significant interval increase in mediastinal, retroperitoneal and mesenteric adenopathy throughout the chest, abdomen and pelvis. 2. Interval increase in circumferential wall thickening of the rectum. 3. Increased fat stranding within the retroperitoneum. 4. Interval increase in sclerotic lesions involving the thoracic spine. 5. Focal wall thickening worse is enhancing lesion involving the urinary bladder dome. Consider further evaluation with urinalysis and/or direct visualization. 6.  Aortic Atherosclerosis (ICD10-I70.0). Electronically Signed   By: Lovey Newcomer M.D.   On: 11/09/2017 11:27   Ct Abdomen Pelvis W  Contrast  Result Date: 11/09/2017 CLINICAL DATA:  Patient with history of rectal cancer. Follow-up evaluation. EXAM: CT CHEST, ABDOMEN, AND PELVIS WITH CONTRAST TECHNIQUE: Multidetector CT imaging of the chest, abdomen and pelvis was performed following the standard protocol during bolus administration of intravenous contrast. CONTRAST:  113m ISOVUE-300 IOPAMIDOL (ISOVUE-300) INJECTION 61% COMPARISON:  CT CAP 08/03/2017. FINDINGS: CT CHEST FINDINGS Cardiovascular: Right anterior chest wall Port-A-Cath is present with tip terminating in the superior vena cava. Normal heart size. Trace fluid superior pericardial recess. Thoracic aortic vascular calcifications. Mediastinum/Nodes: Interval increase in size of prevascular lymph node measuring 9 mm (image 21; series 2), previously 4 mm. Increased subcarinal lymph node measuring 11 mm (image 28; series 2), previously 8 mm. Increased superior mediastinal lymph node measuring 10 mm (image 17; series 2), previously 6 mm. Esophagus is normal in appearance. Increased left supraclavicular lymph node measuring 12 mm, previously 10 mm (image 4; series 2). Lungs/Pleura: Central airways are patent. Dependent atelectasis within the bilateral lower lobes. No large area pulmonary consolidation. No discrete pulmonary nodule. No pleural effusion or pneumothorax. Musculoskeletal: Stable small sclerotic lesion within the lateral right fifth rib (image 84; series 4). Stable small lesion inferior right scapula (image 63; series 4). Stable small sclerotic lesions within the T8 and T11 vertebral bodies. New 10 mm sclerotic lesion within the T7 vertebral body (image 19; series 6). New sclerotic lesion within the T1 vertebral body (image 112; series 6). CT ABDOMEN PELVIS FINDINGS Hepatobiliary: Liver is normal in size and contour. No focal lesion identified. Gallbladder is unremarkable. No intrahepatic or extrahepatic biliary ductal dilatation. Pancreas: Unremarkable Spleen: Unremarkable  Adrenals/Urinary  Tract: Adrenal glands are normal. Kidneys enhance symmetrically with contrast. Read demonstrated cyst interpolar region left kidney. There is a 10 mm enhancing nodule versus focal wall thickening involving the bladder dome (image 54; series 5). Stomach/Bowel: Increased circumferential wall thickening of the rectum (image 126; series 2). Stool throughout the colon. Normal morphology of the stomach. No evidence for small bowel obstruction. Small amount of free fluid throughout the abdomen. Interval development of bulky small bowel mesenteric adenopathy measuring up to 4.8 x 5.7 cm (image 88; series 2). Vascular/Lymphatic: Normal caliber abdominal aorta. Peripheral calcified atherosclerotic plaque. Significant interval increase in bulky retroperitoneal adenopathy predominately within the upper abdomen about the celiac axis and superior mesenteric arteries. Left periaortic nodal conglomerate measures 4.3 x 4.5 cm (image 68; series 2), previously 1.3 x 1.4 cm. Adenopathy extends inferiorly along the course of the aorta. Increased upper abdominal adenopathy including a 1.9 cm lymph node adjacent to the spleen (image 59; series 2), previously 0.7 cm. Reproductive: Prostate is unremarkable. Other: None. Musculoskeletal: Similar small sclerotic lesions inferior L3 vertebral body (image 107; series 6). IMPRESSION: 1. Significant interval increase in mediastinal, retroperitoneal and mesenteric adenopathy throughout the chest, abdomen and pelvis. 2. Interval increase in circumferential wall thickening of the rectum. 3. Increased fat stranding within the retroperitoneum. 4. Interval increase in sclerotic lesions involving the thoracic spine. 5. Focal wall thickening worse is enhancing lesion involving the urinary bladder dome. Consider further evaluation with urinalysis and/or direct visualization. 6.  Aortic Atherosclerosis (ICD10-I70.0). Electronically Signed   By: Lovey Newcomer M.D.   On: 11/09/2017 11:27     Medications: I have reviewed the patient's current medications.   Assessment/Plan:  1. Rectal cancer  MRI lumbar spine 10/20/2016 with bilateral hydronephrosis and retroperitoneal adenopathy  CT abdomen/pelvis 10/20/2016 with extensive retroperitoneal process with marked interstitial thickening and lymphadenopathy; bilateral hydronephrosis; similar ill-defined soft tissue density and skin thickening around the umbilicus; diffuse bladder wall thickening slightly asymmetric at the bladder dome but no obvious bladder mass  Biopsy para-aortic lymph node 11/10/2016-metastatic adenocarcinoma consistent with GI primary  11/23/2016 CEA elevated, CA-19-9 normal  Colonoscopy 11/27/2016-fungating infiltrative nonobstructing mass in the distal rectum involving the anal verge. The rectum was fixed and frozen in the pelvis precluding passage of scope beyond the distal sigmoid colon. 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  PET scan 06/22/2018showed minimal activity in the primary rectal carcinoma, diffuse wall thickening; mild activity associate with retroperitoneal lymph nodes; persistent haziness within the retroperitoneum. No evidence of liver metastasis or distant metastasis.  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  Cycle 2 FOLFOX 12/20/2016  Cycle 3 FOLFOX 01/03/2017 (oxaliplatin dose reduced secondary to thrombocytopenia)   Cycle 4 FOLFOX 01/17/2017 (oxaliplatin held due to thrombocytopenia)  Cycle 5 FOLFOX 02/15/2017  CTs 02/26/2017-new sclerotic lesions in the spine and right iliac, no residual adenopathy,  persistent rectal wall thickening  Cycle 6 FOLFOX 03/01/2017 (Neulasta added, oxaliplatin further dose reduced)   Cycle 7 FOLFOX 03/15/2017 (oxaliplatin held secondary to thrombocytopenia)  Cycle 8 FOLFOX 03/29/2017  Cycle 9 FOLFOX 04/12/2017 (oxaliplatin held secondary to thrombocytopenia and neuropathy)  Cycle 10 FOLFOX 04/25/2017 (oxaliplatin held secondary to neuropathy)  CTs 05/07/2017-stable rectal thickening, stable periaortic soft tissue, stable sclerotic lesions at the lumbar spine and pelvis  Treatment changed  to maintenance capecitabine beginning 05/14/2017  CTs 08/03/2017-irregular annular wall thickening mid to lower rectum appears mildly increased; newly enlarged infiltrative retroperitoneal nodal metastasis; mild left supraclavicular adenopathy appears minimally increased; ill-defined umbilical soft tissue thickening stable; small scattered sclerotic bone lesions stable.  Cycle 1 FOLFIRI/Panitumumab 08/20/2017  Cycle 2 FOLFIRI/Panitumumab 09/10/2017 (irinotecan and 5-FU dose reduced due to mucositis)  Cycle 3 FOLFIRI/panitumumab 09/24/2017  Cycle 4 FOLFIRI/panitumumab 10/08/2017 (5-fluorouracil further dose reduced secondary to mucositis)   Cycle 5 FOLFIRI/Panitumumab 10/29/2017  CTs 11/09/2017- increase in mediastinal, retroperitoneal, and mesenteric adenopathy, increased wall thickening of the rectum, increased sclerotic lesions at the thoracic spine 2. Retroperitoneal lymphadenopathy secondary to #1 3. Bilateral hydronephrosis status post evaluation by urology 4. Asymmetry of the bladder dome on CT 10/20/2016 status post cystoscopy with biopsy planned 12/11/2016 (Dr. 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 Change in bowel habits, secondary to #1-improved. 6. Right leg edema, question due to lymphatic obstruction;venous Doppler negative for DVT 11/23/2016. 7. Port-A-Cath placement  12/05/2016 8. Thrombocytopenia secondary to chemotherapy; oxaliplatin dose reduced with cycle 3 FOLFOX. Progressive thrombocytopenia 01/17/2017; oxaliplatin held 01/17/2017. 9. Right lower extremity cellulitis 01/27/2017-blood culture positive for methicillin sensitive staph aureus; Port-A-Cath removed. PICC line placed. TEE 01/30/2017 without evidence of vegetation. 14 days of Ancef recommended after Port-A-Cath removal with end date 02/14/2017. 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 Mucositis following cycle 1 FOLFIRI/Panitumumab-cycle 2 delayed andirinotecan/5-FU dose reduced.Improved    Disposition: Nicholas Suarez has completed 5 cycles of FOLFIRI/panitumumab.  There is clinical and CT evidence of disease progression.  I reviewed the restaging CT images with Nicholas Suarez and Nicholas Suarez.  We discussed treatment options.  FOLFIRI/panitumumab will be discontinued. The cutaneous lesions at the upper chest and lower neck are likely cutaneous metastases.  We discussed standard salvage treatment options and referring him to consider a clinical trial.  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  I will refer him to Dr. Reynaldo Minium to see if he qualifies for a clinical trial at [facility] and to get Dr. Margarette Canada opinion regarding standard salvage options.    Betsy Coder, MD  11/12/2017  9:08 AM

## 2017-11-13 ENCOUNTER — Telehealth: Payer: Self-pay

## 2017-11-13 ENCOUNTER — Telehealth: Payer: Self-pay | Admitting: Oncology

## 2017-11-13 NOTE — Telephone Encounter (Signed)
Call from pt informing that he has appt with Dr. Reynaldo Minium on 6/20 and wants to know if he needs to keep his appt with Benay Spice on 6/19. Per MD, appt moved to 6/24 at 1130. LVM for pt with information

## 2017-11-13 NOTE — Telephone Encounter (Signed)
Pt appt. To see Dr. Reynaldo Minium is 11/29/17@11 :00. Pt is aware

## 2017-11-14 ENCOUNTER — Telehealth: Payer: Self-pay | Admitting: Oncology

## 2017-11-14 NOTE — Telephone Encounter (Signed)
Appointment rescheduled per 6/4 sch msg/ Melia Wilkes to notify patient

## 2017-11-19 ENCOUNTER — Telehealth: Payer: Self-pay | Admitting: *Deleted

## 2017-11-19 MED ORDER — PANTOPRAZOLE SODIUM 40 MG PO TBEC: 40.0000 mg | DELAYED_RELEASE_TABLET | Freq: Every day | ORAL | 0 refills | Status: DC

## 2017-11-19 NOTE — Telephone Encounter (Signed)
Patient called and left a message with concerns on stomach pain. Intense "stomach acid" pains. He reports the pain subsides after taking 2 tums but quickly returns. He tried a Prilosec OTC and he reports that helped better. He would like to see if Dr. Benay Spice recommends this or if he has a stronger suggestion.   Per Dr. Benay Spice, prescription sent in for protonix 40mg  daily sent to pharmacy. Patient notified and verbalized an understanding.

## 2017-11-22 ENCOUNTER — Telehealth: Payer: Self-pay | Admitting: *Deleted

## 2017-11-22 NOTE — Telephone Encounter (Signed)
"  Called a few days ago receiving ant-acid pantoprazole but it is not working.  My stomach is still burning up, it's on fire.  Please call soon to give me some help."  "I ate snack of leftover spaghetti at 9:00 pm last night.  In bed about 11:30 pm.  At 5:00 am I bout Mylanta which helped for almost four hours.  I ate a banana that set my stomach off on fire."  Discussed foods to avoid before call transfer.

## 2017-11-26 ENCOUNTER — Ambulatory Visit: Payer: Medicare Other | Admitting: Oncology

## 2017-11-26 ENCOUNTER — Other Ambulatory Visit: Payer: Self-pay

## 2017-11-26 ENCOUNTER — Other Ambulatory Visit: Payer: Medicare Other

## 2017-11-26 ENCOUNTER — Ambulatory Visit: Payer: Medicare Other

## 2017-11-26 ENCOUNTER — Emergency Department (HOSPITAL_COMMUNITY)
Admission: EM | Admit: 2017-11-26 | Discharge: 2017-11-26 | Disposition: A | Discharge status: Home / Self Care | Payer: Medicare Other | Attending: Emergency Medicine | Admitting: Emergency Medicine

## 2017-11-26 ENCOUNTER — Emergency Department (HOSPITAL_COMMUNITY): Payer: Medicare Other

## 2017-11-26 ENCOUNTER — Encounter (HOSPITAL_COMMUNITY): Payer: Self-pay

## 2017-11-26 DIAGNOSIS — J45909 Unspecified asthma, uncomplicated: Secondary | ICD-10-CM | POA: Insufficient documentation

## 2017-11-26 DIAGNOSIS — Z85038 Personal history of other malignant neoplasm of large intestine: Secondary | ICD-10-CM | POA: Diagnosis not present

## 2017-11-26 DIAGNOSIS — M545 Low back pain: Secondary | ICD-10-CM | POA: Insufficient documentation

## 2017-11-26 DIAGNOSIS — Z8551 Personal history of malignant neoplasm of bladder: Secondary | ICD-10-CM | POA: Insufficient documentation

## 2017-11-26 DIAGNOSIS — R34 Anuria and oliguria: Secondary | ICD-10-CM

## 2017-11-26 DIAGNOSIS — R1013 Epigastric pain: Secondary | ICD-10-CM | POA: Insufficient documentation

## 2017-11-26 DIAGNOSIS — Z8546 Personal history of malignant neoplasm of prostate: Secondary | ICD-10-CM | POA: Insufficient documentation

## 2017-11-26 DIAGNOSIS — Z7982 Long term (current) use of aspirin: Secondary | ICD-10-CM | POA: Insufficient documentation

## 2017-11-26 DIAGNOSIS — Z87891 Personal history of nicotine dependence: Secondary | ICD-10-CM | POA: Diagnosis not present

## 2017-11-26 DIAGNOSIS — Z79899 Other long term (current) drug therapy: Secondary | ICD-10-CM | POA: Diagnosis not present

## 2017-11-26 LAB — COMPREHENSIVE METABOLIC PANEL
ALT: 14 U/L — AB (ref 17–63)
AST: 22 U/L (ref 15–41)
Albumin: 3.1 g/dL — ABNORMAL LOW (ref 3.5–5.0)
Alkaline Phosphatase: 84 U/L (ref 38–126)
Anion gap: 5 (ref 5–15)
BUN: 19 mg/dL (ref 6–20)
CHLORIDE: 108 mmol/L (ref 101–111)
CO2: 26 mmol/L (ref 22–32)
CREATININE: 1.04 mg/dL (ref 0.61–1.24)
Calcium: 9.5 mg/dL (ref 8.9–10.3)
GFR calc Af Amer: >60 mL/min (ref >60)
GFR calc non Af Amer: >60 mL/min (ref >60)
Glucose, Bld: 108 mg/dL — ABNORMAL HIGH (ref 65–99)
POTASSIUM: 4.3 mmol/L (ref 3.5–5.1)
Sodium: 139 mmol/L (ref 135–145)
Total Bilirubin: 0.4 mg/dL (ref 0.3–1.2)
Total Protein: 6 g/dL — ABNORMAL LOW (ref 6.5–8.1)

## 2017-11-26 LAB — CBC
HEMATOCRIT: 36.7 % — AB (ref 39.0–52.0)
Hemoglobin: 12.3 g/dL — ABNORMAL LOW (ref 13.0–17.0)
MCH: 31.6 pg (ref 26.0–34.0)
MCHC: 33.5 g/dL (ref 30.0–36.0)
MCV: 94.3 fL (ref 78.0–100.0)
PLATELETS: 149 10*3/uL — AB (ref 150–400)
RBC: 3.89 MIL/uL — ABNORMAL LOW (ref 4.22–5.81)
RDW: 13.3 % (ref 11.5–15.5)
WBC: 4.7 10*3/uL (ref 4.0–10.5)

## 2017-11-26 LAB — URINALYSIS, ROUTINE W REFLEX MICROSCOPIC
BACTERIA UA: NONE SEEN
BILIRUBIN URINE: NEGATIVE
Glucose, UA: NEGATIVE mg/dL
KETONES UR: NEGATIVE mg/dL
LEUKOCYTES UA: NEGATIVE
Nitrite: NEGATIVE
PH: 5 (ref 5.0–8.0)
Protein, ur: 30 mg/dL — AB
RBC / HPF: >50 RBC/hpf — ABNORMAL HIGH (ref 0–5)
SPECIFIC GRAVITY, URINE: 1.027 (ref 1.005–1.030)

## 2017-11-26 LAB — LIPASE, BLOOD: Lipase: 74 U/L — ABNORMAL HIGH (ref 11–51)

## 2017-11-26 MED ORDER — IOPAMIDOL (ISOVUE-300) INJECTION 61%
INTRAVENOUS | Status: AC
  Filled 2017-11-26: qty 100

## 2017-11-26 MED ORDER — IOPAMIDOL (ISOVUE-300) INJECTION 61%
100.0000 mL | Freq: Once | INTRAVENOUS | Status: AC | PRN
  Administered 2017-11-26: 100 mL via INTRAVENOUS

## 2017-11-26 MED ORDER — KETOROLAC TROMETHAMINE 30 MG/ML IJ SOLN
15.0000 mg | Freq: Once | INTRAMUSCULAR | Status: AC
Start: 2017-11-26 — End: 2017-11-26
  Administered 2017-11-26: 15 mg via INTRAVENOUS
  Filled 2017-11-26: qty 1

## 2017-11-26 MED ORDER — SUCRALFATE 1 GM/10ML PO SUSP: 1.0000 g | Freq: Three times a day (TID) | ORAL | 0 refills | Status: DC

## 2017-11-26 MED ORDER — GI COCKTAIL ~~LOC~~
30.0000 mL | Freq: Once | ORAL | Status: AC
  Administered 2017-11-26: 30 mL via ORAL
  Filled 2017-11-26: qty 30

## 2017-11-26 MED ORDER — PANTOPRAZOLE SODIUM 40 MG PO TBEC: 40.0000 mg | DELAYED_RELEASE_TABLET | Freq: Two times a day (BID) | ORAL | 0 refills | Status: DC

## 2017-11-26 NOTE — ED Triage Notes (Signed)
States swelling in groin area and feels like he needs to urinate and pain and burning in abdomen pt has history of cancer.

## 2017-11-26 NOTE — ED Provider Notes (Signed)
[facility] DEPT Provider Note   CSN: [id number] Arrival date & time: 11/26/17  0015     History   Chief Complaint Chief Complaint  Patient presents with  . Urinary Frequency    HPI Nicholas Suarez is a 67 y.o. male.  Patient with a history of bladder, prostate and colon cancer presents with low back pain and difficulty urinating over the last 24 hours. No hematuria, fever, nausea or vomiting. He reports the last time her urinated he produced "a teaspoon full". Last chemo was 3 weeks prior. No radiation treatments currently. He reports he has been told his abdominal lymph nodes were swollen and possibly causing ureteral narrowing bilaterally.   The history is provided by the patient. No language interpreter was used.  Urinary Frequency     Past Medical History:  Diagnosis Date  . Allergic rhinitis, seasonal   . Anemia   . Bilateral hydrocele   . Bilateral hydronephrosis   . Chronic constipation   . Edema of right lower extremity   . 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 Wears glasses     Patient Active Problem List   Diagnosis Date Noted  . 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 Chronic pain of both shoulders 06/11/2017  . Normocytic anemia 01/29/2017  . Thrombocytopenia ([place]) 01/29/2017  . MSSA bacteremia 01/29/2017  . Bacteremia   . Cellulitis 01/28/2017  . Port-A-Cath in place 01/17/2017  . Goals of care, counseling/discussion 11/30/2016  . Rectal cancer ([place]) 11/27/2016  . Adenocarcinoma ([place]) 11/17/2016  . Right leg swelling 11/14/2016  . Retroperitoneal lymphadenopathy 11/01/2016  . Hyperlipidemia LDL goal <130 11/01/2016  . Hydronephrosis 11/01/2016  . Allergic rhinitis 02/18/2015  . Asthma   . Routine general medical examination at a health care facility 05/23/2012    Past  Surgical History:  Procedure Laterality Date  . COLONOSCOPY WITH ESOPHAGOGASTRODUODENOSCOPY (EGD)  11-27-2016  dr Earlean Shawl   rectal mass  . CYSTOSCOPY WITH BIOPSY N/A 12/11/2016   Procedure: CYSTOSCOPY WITH BIOPSY;  Surgeon: Kathie Rhodes, MD;  Location: [facility];  Service: Urology;  Laterality: N/A;  . IR FLUORO GUIDE PORT INSERTION RIGHT  12/05/2016  . IR FLUORO GUIDE PORT INSERTION RIGHT  08/14/2017  . IR REMOVAL TUN ACCESS W/ PORT W/O FL MOD SED  01/31/2017  . IR US GUIDE VASC ACCESS RIGHT  12/05/2016  . IR US GUIDE VASC ACCESS RIGHT  08/14/2017  . TEE WITHOUT CARDIOVERSION N/A 01/30/2017   Procedure: TRANSESOPHAGEAL ECHOCARDIOGRAM (TEE);  Surgeon: Acie Fredrickson Wonda Cheng, MD;  Location: [facility] ENDOSCOPY;  Service: Cardiovascular;  Laterality: N/A;  . UMBILICAL HERNIA REPAIR  05/2016        Home Medications    Prior to Admission medications   Medication Sig Start Date End Date Taking? Authorizing Provider  aspirin 325 MG tablet Take 325 mg by mouth once a week.     [provider]  Azelastine-Fluticasone (DYMISTA NA) Place into the nose.    [provider]  fluticasone (CUTIVATE) 0.05 % cream Apply topically daily as needed. Apply to affected areas. 09/03/17   Ladell Pier, MD  lidocaine-prilocaine (EMLA) cream Apply 1 application topically as needed.    [provider]  meloxicam (MOBIC) 15 MG tablet Take 1 tablet (15 mg total) by mouth daily. 06/11/17   Janith Lima, MD  minocycline (MINOCIN) 100 MG capsule Take  1 capsule (100 mg total) by mouth 2 (two) times daily. 08/06/17   [name], NP  Misc Natural Products (GLUCOSAMINE CHOND COMPLEX/MSM PO) Take 1 tablet by mouth daily.    [provider]  Multiple Vitamin (MULTIVITAMIN) tablet Take 1 tablet by mouth every 3 (three) days.     [provider]  Omega-3 Fatty Acids (FISH OIL) 1200 MG CAPS Take by mouth every 3 (three) days.     [provider]  pantoprazole  (PROTONIX) 40 MG tablet Take 1 tablet (40 mg total) by mouth daily. 11/19/17   Ladell Pier, MD  Polyethylene Glycol 3350 (MIRALAX PO) Take 17 g by mouth daily as needed (constipation).     [provider]  prochlorperazine (COMPAZINE) 10 MG tablet Take 1 tablet (10 mg total) by mouth every 6 (six) hours as needed for nausea or vomiting. 11/30/16   [name], NP  saw palmetto 80 MG capsule Take 80 mg by mouth every 3 (three) days.     [provider]  VIRTUSSIN A/C 100-10 MG/5ML syrup TK 10 ML PO HS 10/31/17   [provider]    Family History Family History  Problem Relation Age of Onset  . Breast cancer Mother   . Alcohol abuse Father   . Heart disease Other   . Cancer Other        Breast Cancer  . Arthritis Other   . Alcohol abuse Other   . Drug abuse Other   . Stroke Neg Hx   . Kidney disease Neg Hx   . Hypertension Neg Hx   . Hyperlipidemia Neg Hx   . Early death Neg Hx   . Colon cancer Neg Hx   . Esophageal cancer Neg Hx   . Rectal cancer Neg Hx   . Stomach cancer Neg Hx     Social History Social History   Tobacco Use  . Smoking status: Former Smoker    Packs/day: 1.00    Years: 10.00    Pack years: 10.00    Types: Cigarettes, Pipe, Cigars    Last attempt to quit: 06/12/1988    Years since quitting: 29.4  . Smokeless tobacco: Former Systems developer    Types: Chew    Quit date: 06/12/1993  Substance Use Topics  . Alcohol use: No  . Drug use: No     Allergies   Patient has no known allergies.   Review of Systems Review of Systems  Constitutional: Negative for chills and fever.  HENT: Negative.   Respiratory: Negative.   Cardiovascular: Negative.   Gastrointestinal: Negative.   Genitourinary: Positive for decreased urine volume and frequency. Negative for hematuria.  Musculoskeletal: Positive for back pain.  Skin: Negative.   Neurological: Positive for weakness.     Physical Exam Updated Vital Signs BP 117/77 (BP Location:  Right Arm)   Pulse 96   Temp 98 F (36.7 C) (Oral)   Resp 16   SpO2 99%   Physical Exam  Constitutional: He appears well-developed and well-nourished.  Feels generally ill  HENT:  Head: Normocephalic.  Neck: Normal range of motion. Neck supple.  Cardiovascular: Normal rate and regular rhythm.  No murmur heard. Pulmonary/Chest: Effort normal and breath sounds normal. No stridor. He has no wheezes. He has no rales.  Abdominal: Soft. He exhibits no distension and no mass. Bowel sounds are decreased. There is generalized tenderness. There is no rebound and no guarding.  Genitourinary: Penis normal. Right testis shows no mass.  Left testis shows no mass.  Genitourinary Comments: Uncircumcised. Significant scrotal swelling without palpable testicular tenderness or swelling.   Musculoskeletal: Normal range of motion.  Neurological: He is alert. No cranial nerve deficit.  Skin: Skin is warm and dry. No rash noted.  Psychiatric: He has a normal mood and affect.     ED Treatments / Results  Labs (all labs ordered are listed, but only abnormal results are displayed) Labs Reviewed - No data to display  EKG None  Radiology No results found.  Procedures Procedures (including critical care time)  Medications Ordered in ED Medications - No data to display   Initial Impression / Assessment and Plan / ED Course  I have reviewed the triage vital signs and the nursing notes.  Pertinent labs & imaging results that were available during my care of the patient were reviewed by me and considered in my medical decision making (see chart for details).     Patient with a history of rectal cancer presents with bilateral flank pain and decreased urination. No fever, nausea, hematuria.   On exam, he has diffuse abdominal tenderness. No flank tenderness. UA negative for infection, positive for blood, moderately concentrated. CT pending.   The patient develops recurrent burning type abdominal  pain. GI cocktail provided with complete relief.   CT without new findings. He has a small amount of ascites. Dr. Wyvonnia Dusky performed bedside US of abdomen and no significant fluid is found. Discussed enlarged scrotum and ultrasound was offered, however, the patient will follow up at [facility] this week and prefers to wait for further evaluation of this as it is not a new finding. There is no tenderness to suggest testicular torsion or other acute scrotal/testicular problem.   He can be discharged home. Recommended increasing his Protonix to BID dosing, add Carafate. Return precautions discussed.   Final Clinical Impressions(s) / ED Diagnoses   Final diagnoses:  None   1. Dyspepsia 2. Decreased urination   ED Discharge Orders    None       Charlann Lange, PA-C 11/26/17 0457    Ezequiel Essex, MD 11/27/17 [phone]

## 2017-11-26 NOTE — Discharge Instructions (Addendum)
Keep your appointment with [facility] later this week.  You can increase your Protonix (pantoprazole) to twice daily, and take Carafate as directed.  Return to the emergency department with any fever, uncontrolled pain or new concern.

## 2017-11-27 LAB — URINE CULTURE: Culture: NO GROWTH

## 2017-11-28 ENCOUNTER — Ambulatory Visit: Payer: Medicare Other | Admitting: Oncology

## 2017-11-29 ENCOUNTER — Encounter: Payer: Self-pay | Admitting: Oncology

## 2017-12-03 ENCOUNTER — Inpatient Hospital Stay (HOSPITAL_BASED_OUTPATIENT_CLINIC_OR_DEPARTMENT_OTHER): Payer: Medicare Other | Admitting: Oncology

## 2017-12-03 ENCOUNTER — Telehealth: Payer: Self-pay | Admitting: Oncology

## 2017-12-03 VITALS — BP 124/80 | HR 87 | Temp 97.7°F | Resp 18 | Ht 75.0 in | Wt 220.7 lb

## 2017-12-03 DIAGNOSIS — R109 Unspecified abdominal pain: Secondary | ICD-10-CM

## 2017-12-03 DIAGNOSIS — C786 Secondary malignant neoplasm of retroperitoneum and peritoneum: Secondary | ICD-10-CM

## 2017-12-03 DIAGNOSIS — C20 Malignant neoplasm of rectum: Secondary | ICD-10-CM | POA: Diagnosis not present

## 2017-12-03 DIAGNOSIS — Z5111 Encounter for antineoplastic chemotherapy: Secondary | ICD-10-CM | POA: Diagnosis not present

## 2017-12-03 DIAGNOSIS — C7989 Secondary malignant neoplasm of other specified sites: Secondary | ICD-10-CM | POA: Diagnosis not present

## 2017-12-03 DIAGNOSIS — C674 Malignant neoplasm of posterior wall of bladder: Secondary | ICD-10-CM

## 2017-12-03 DIAGNOSIS — R609 Edema, unspecified: Secondary | ICD-10-CM

## 2017-12-03 DIAGNOSIS — R911 Solitary pulmonary nodule: Secondary | ICD-10-CM

## 2017-12-03 DIAGNOSIS — G629 Polyneuropathy, unspecified: Secondary | ICD-10-CM

## 2017-12-03 MED ORDER — TRAMADOL HCL 50 MG PO TABS: 50.0000 mg | ORAL_TABLET | Freq: Four times a day (QID) | ORAL | 0 refills | Status: AC | PRN

## 2017-12-03 NOTE — Progress Notes (Signed)
DISCONTINUE OFF PATHWAY REGIMEN - Colorectal   [id number]:FOLFIRI + Panitumumab q14d (**2 cycles per order sheet**):   A cycle is every 14 days:     Panitumumab      Irinotecan      Leucovorin      5-Fluorouracil      5-Fluorouracil   **Always confirm dose/schedule in your pharmacy ordering system**  REASON: Disease Progression PRIOR TREATMENT: Off Pathway: FOLFIRI + Panitumumab q14d (**2 cycles per order sheet**) TREATMENT RESPONSE: Progressive Disease (PD)  START ON PATHWAY REGIMEN - Colorectal     A cycle is every 14 days:     Oxaliplatin      Leucovorin      5-Fluorouracil      5-Fluorouracil   **Always confirm dose/schedule in your pharmacy ordering system**  Patient Characteristics: Metastatic Colorectal, Third Line, KRAS/NRAS Wild-Type, BRAF Wild-Type/Unknown, Prior Anti-EGFR Therapy, MSS / pMMR Current evidence of distant metastases<= Yes AJCC T Category: Staged < 8th Ed. AJCC N Category: Staged < 8th Ed. AJCC M Category: Staged < 8th Ed. AJCC 8 Stage Grouping: Staged < 8th Ed. BRAF Mutation Status: Wild Type (no mutation) KRAS/NRAS Mutation Status: Wild Type (no mutation) Line of therapy: Third Engineer, civil (consulting) Status: MSS/pMMR Intent of Therapy: Non-Curative / Palliative Intent, Discussed with Patient

## 2017-12-03 NOTE — Progress Notes (Signed)
[facility] OFFICE PROGRESS NOTE   Diagnosis: Colon cancer  INTERVAL HISTORY:   Nicholas Suarez returns for a scheduled visit.  He was seen in the emergency room 11/26/2017 with low back and abdominal pain.  He continues to have a burning discomfort in the mid abdomen.  The bladder had abnormal wall thickening.  The rectal mass appeared unchanged.  Worsening retroperitoneal adenopathy was noted.  Mesenteric adenopathy was also noted.  Increased small volume ascites.  He reports he was placed on Carafate.  The Protonix dose was increased to twice daily.  There is been no improvement in the discomfort.  He complains of increasing scrotal edema.  Nicholas Suarez has a diminished appetite.  He saw Dr. Reynaldo Minium 11/29/2017.  Dr. Reynaldo Minium recommended repeat treatment with FOLFOX.  Nicholas Suarez continues to have numbness in the hands and feet.    Objective:  Vital signs in last 24 hours:  Blood pressure 124/80, pulse 87, temperature 97.7 F (36.5 C), temperature source Oral, resp. rate 18, height '6\' 3"'  (1.905 m), weight 220 lb 11.2 oz (100.1 kg), SpO2 99 %.    Lymphatics: Firm nodes in the left lower posterior neck and supraclavicular fossa Resp: Lungs clear bilaterally Cardio: Regular rate and rhythm GI: No hepatomegaly, no mass, no apparent ascites Vascular: Trace pitting edema at the low leg bilaterally Skin: Erythematous nodular approximate 2 cm lesions at the right upper anterior chest and near the left clavicle, resolving acne type rash over the trunk GU: Marked scrotal edema  Portacath/PICC-without erythema  Lab Results:  Lab Results  Component Value Date   WBC 4.7 11/26/2017   HGB 12.3 (L) 11/26/2017   HCT 36.7 (L) 11/26/2017   MCV 94.3 11/26/2017   PLT 149 (L) 11/26/2017   NEUTROABS 4.6 11/12/2017    CMP  Lab Results  Component Value Date   NA 139 11/26/2017   K 4.3 11/26/2017   CL 108 11/26/2017   CO2 26 11/26/2017   GLUCOSE 108 (H) 11/26/2017   BUN  19 11/26/2017   CREATININE 1.04 11/26/2017   CALCIUM 9.5 11/26/2017   PROT 6.0 (L) 11/26/2017   ALBUMIN 3.1 (L) 11/26/2017   AST 22 11/26/2017   ALT 14 (L) 11/26/2017   ALKPHOS 84 11/26/2017   BILITOT 0.4 11/26/2017   GFRNONAA >60 11/26/2017   GFRAA >60 11/26/2017    Lab Results  Component Value Date   CEA1 1,395.99 (H) 11/12/2017      Imaging:  As per HPI, CT images from 11/26/2017 reviewed with Nicholas Suarez and his family Medications: I have reviewed the patient's current medications.   Assessment/Plan: 1. Rectal cancer  MRI lumbar spine 10/20/2016 with bilateral hydronephrosis and retroperitoneal adenopathy  CT abdomen/pelvis 10/20/2016 with extensive retroperitoneal process with marked interstitial thickening and lymphadenopathy; bilateral hydronephrosis; similar ill-defined soft tissue density and skin thickening around the umbilicus; diffuse bladder wall thickening slightly asymmetric at the bladder dome but no obvious bladder mass  Biopsy para-aortic lymph node 11/10/2016-metastatic adenocarcinoma consistent with GI primary  11/23/2016 CEA elevated, CA-19-9 normal  Colonoscopy 11/27/2016-fungating infiltrative nonobstructing mass in the distal rectum involving the anal verge. The rectum was fixed and frozen in the pelvis precluding passage of scope beyond the distal sigmoid colon. 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  PET scan 06/22/2018showed minimal activity in the primary rectal carcinoma, diffuse wall thickening; mild activity associate with retroperitoneal lymph nodes;  persistent haziness within the retroperitoneum. No evidence of liver metastasis or distant metastasis.  Cycle 1 FOLFOX 12/06/2016  Posterior bladder wall biopsy 12/11/2016-metastatic carcinoma, consistent with  metastatic signet ring cell carcinoma  Biopsy left supraclavicular adenopathy 12/14/2016-metastatic signet ring cell adenocarcinoma  Cycle 2 FOLFOX 12/20/2016  Cycle 3 FOLFOX 01/03/2017 (oxaliplatin dose reduced secondary to thrombocytopenia)   Cycle 4 FOLFOX 01/17/2017 (oxaliplatin held due to thrombocytopenia)  Cycle 5 FOLFOX 02/15/2017  CTs 02/26/2017-new sclerotic lesions in the spine and right iliac, no residual adenopathy, persistent rectal wall thickening  Cycle 6 FOLFOX 03/01/2017 (Neulasta added, oxaliplatin further dose reduced)   Cycle 7 FOLFOX 03/15/2017 (oxaliplatin held secondary to thrombocytopenia)  Cycle 8 FOLFOX 03/29/2017  Cycle 9 FOLFOX 04/12/2017 (oxaliplatin held secondary to thrombocytopenia and neuropathy)  Cycle 10 FOLFOX 04/25/2017 (oxaliplatin held secondary to neuropathy)  CTs 05/07/2017-stable rectal thickening, stable periaortic soft tissue, stable sclerotic lesions at the lumbar spine and pelvis  Treatment changed to maintenance capecitabine beginning 05/14/2017  CTs 08/03/2017-irregular annular wall thickening mid to lower rectum appears mildly increased; newly enlarged infiltrative retroperitoneal nodal metastasis; mild left supraclavicular adenopathy appears minimally increased; ill-defined umbilical soft tissue thickening stable; small scattered sclerotic bone lesions stable.  Cycle 1 FOLFIRI/Panitumumab 08/20/2017  Cycle 2 FOLFIRI/Panitumumab 09/10/2017 (irinotecan and 5-FU dose reduced due to mucositis)  Cycle 3 FOLFIRI/panitumumab 09/24/2017  Cycle 4 FOLFIRI/panitumumab 10/08/2017 (5-fluorouracil further dose reduced secondary to mucositis)  Cycle 5 FOLFIRI/Panitumumab 10/29/2017  CTs 11/09/2017- increase in mediastinal, retroperitoneal, and mesenteric adenopathy, increased wall thickening of the rectum, increased sclerotic lesions at the thoracic spine 2. Retroperitoneal lymphadenopathy secondary to #1 3. Bilateral hydronephrosis status post  evaluation by urology 4. Asymmetry of the bladder dome on CT 10/20/2016 status post cystoscopy with biopsy planned 12/11/2016 (Dr. 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 Change in bowel habits, secondary to #1-improved. 6. Right leg edema, question due to lymphatic obstruction;venous Doppler negative for DVT 11/23/2016. 7. Port-A-Cath placement 12/05/2016 8. Thrombocytopenia secondary to chemotherapy; oxaliplatin dose reduced with cycle 3 FOLFOX. Progressive thrombocytopenia 01/17/2017; oxaliplatin held 01/17/2017. 9. Right lower extremity cellulitis 01/27/2017-blood culture positive for methicillin sensitive staph aureus; Port-A-Cath removed. PICC line placed. TEE 01/30/2017 without evidence of vegetation. 14 days of Ancef recommended after Port-A-Cath removal with end date 02/14/2017. 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 Mucositis following cycle 1 FOLFIRI/Panitumumab-cycle 2 delayed andirinotecan/5-FU dose reduced.Improved    Disposition: Nicholas Suarez has metastatic colon cancer.  He has clinical and radiologic evidence of disease progression in multiple sites.  He did not progress while on FOLFOX.  We have not administered Avastin secondary to bladder involvement with tumor.  I will discuss Avastin therapy with Nicholas Suarez when he returns in 2 weeks.  The plan is to resume FOLFOX on 12/06/2017.  We reviewed the potential for worsening neuropathy.  He agrees to proceed.  He will elevate the scrotum and try a support strap.  I gave him a prescription for tramadol to use as needed for pain.  I suspect the abdominal pain is related to mesenteric/rectal peritoneal adenopathy.  40 minutes were spent with the patient today.  The majority of the time was used for counseling and coordination of care.  Betsy Coder, MD  12/03/2017  3:39 PM

## 2017-12-03 NOTE — Telephone Encounter (Signed)
Appointment scheduled (7/15) added other appointment to infusion log (6/27) avs/calendar printed per 6/24 los

## 2017-12-06 ENCOUNTER — Other Ambulatory Visit: Payer: Self-pay | Admitting: Medical

## 2017-12-06 ENCOUNTER — Inpatient Hospital Stay: Payer: Medicare Other

## 2017-12-06 ENCOUNTER — Inpatient Hospital Stay: Payer: Medicare Other | Admitting: Medical

## 2017-12-06 ENCOUNTER — Ambulatory Visit: Payer: Medicare Other | Admitting: Nurse Practitioner

## 2017-12-06 VITALS — BP 119/82 | HR 80 | Temp 97.8°F | Resp 18

## 2017-12-06 DIAGNOSIS — C20 Malignant neoplasm of rectum: Secondary | ICD-10-CM

## 2017-12-06 DIAGNOSIS — L089 Local infection of the skin and subcutaneous tissue, unspecified: Principal | ICD-10-CM

## 2017-12-06 DIAGNOSIS — Z95828 Presence of other vascular implants and grafts: Secondary | ICD-10-CM

## 2017-12-06 DIAGNOSIS — B9689 Other specified bacterial agents as the cause of diseases classified elsewhere: Secondary | ICD-10-CM

## 2017-12-06 DIAGNOSIS — Z5111 Encounter for antineoplastic chemotherapy: Secondary | ICD-10-CM | POA: Diagnosis not present

## 2017-12-06 LAB — CBC WITH DIFFERENTIAL (CANCER CENTER ONLY)
Basophils Absolute: 0 10*3/uL (ref 0.0–0.1)
Basophils Relative: 0 %
EOS PCT: 5 %
Eosinophils Absolute: 0.3 10*3/uL (ref 0.0–0.5)
HEMATOCRIT: 35.8 % — AB (ref 38.4–49.9)
Hemoglobin: 11.9 g/dL — ABNORMAL LOW (ref 13.0–17.1)
LYMPHS ABS: 0.7 10*3/uL — AB (ref 0.9–3.3)
LYMPHS PCT: 12 %
MCH: 30.7 pg (ref 27.2–33.4)
MCHC: 33.2 g/dL (ref 32.0–36.0)
MCV: 92.5 fL (ref 79.3–98.0)
MONO ABS: 0.4 10*3/uL (ref 0.1–0.9)
MONOS PCT: 8 %
NEUTROS ABS: 4.3 10*3/uL (ref 1.5–6.5)
Neutrophils Relative %: 75 %
Platelet Count: 171 10*3/uL (ref 140–400)
RBC: 3.87 MIL/uL — ABNORMAL LOW (ref 4.20–5.82)
RDW: 13.2 % (ref 11.0–14.6)
WBC Count: 5.7 10*3/uL (ref 4.0–10.3)

## 2017-12-06 LAB — CMP (CANCER CENTER ONLY)
ALT: 12 U/L (ref 0–44)
ANION GAP: 5 (ref 5–15)
AST: 18 U/L (ref 15–41)
Albumin: 3 g/dL — ABNORMAL LOW (ref 3.5–5.0)
Alkaline Phosphatase: 108 U/L (ref 38–126)
BILIRUBIN TOTAL: 0.4 mg/dL (ref 0.3–1.2)
BUN: 12 mg/dL (ref 8–23)
CO2: 26 mmol/L (ref 22–32)
Calcium: 9.8 mg/dL (ref 8.9–10.3)
Chloride: 108 mmol/L (ref 98–111)
Creatinine: 0.86 mg/dL (ref 0.61–1.24)
Glucose, Bld: 133 mg/dL — ABNORMAL HIGH (ref 70–99)
POTASSIUM: 3.9 mmol/L (ref 3.5–5.1)
Sodium: 139 mmol/L (ref 135–145)
TOTAL PROTEIN: 6 g/dL — AB (ref 6.5–8.1)

## 2017-12-06 LAB — CEA (IN HOUSE-CHCC): CEA (CHCC-IN HOUSE): 2039.08 ng/mL — AB (ref 0.00–5.00)

## 2017-12-06 MED ORDER — PALONOSETRON HCL INJECTION 0.25 MG/5ML
INTRAVENOUS | Status: AC
  Filled 2017-12-06: qty 5

## 2017-12-06 MED ORDER — SODIUM CHLORIDE 0.9% FLUSH
10.0000 mL | Freq: Once | INTRAVENOUS | Status: AC
  Administered 2017-12-06: 10 mL
  Filled 2017-12-06: qty 10

## 2017-12-06 MED ORDER — DEXAMETHASONE SODIUM PHOSPHATE 10 MG/ML IJ SOLN
10.0000 mg | Freq: Once | INTRAMUSCULAR | Status: AC
  Administered 2017-12-06: 10 mg via INTRAVENOUS

## 2017-12-06 MED ORDER — PALONOSETRON HCL INJECTION 0.25 MG/5ML
0.2500 mg | Freq: Once | INTRAVENOUS | Status: AC
  Administered 2017-12-06: 0.25 mg via INTRAVENOUS

## 2017-12-06 MED ORDER — MUPIROCIN 2 % EX OINT: TOPICAL_OINTMENT | CUTANEOUS | 2 refills | Status: DC

## 2017-12-06 MED ORDER — DEXTROSE 5 % IV SOLN
Freq: Once | INTRAVENOUS | Status: AC
  Administered 2017-12-06: 13:00:00 via INTRAVENOUS

## 2017-12-06 MED ORDER — SODIUM CHLORIDE 0.9 % IV SOLN: 10.0000 mg | Freq: Once | INTRAVENOUS | Status: DC

## 2017-12-06 MED ORDER — LEUCOVORIN CALCIUM INJECTION 350 MG
200.0000 mg/m2 | Freq: Once | INTRAVENOUS | Status: AC
  Administered 2017-12-06: 460 mg via INTRAVENOUS
  Filled 2017-12-06: qty 23

## 2017-12-06 MED ORDER — OXALIPLATIN CHEMO INJECTION 100 MG/20ML
65.0000 mg/m2 | Freq: Once | INTRAVENOUS | Status: AC
  Administered 2017-12-06: 150 mg via INTRAVENOUS
  Filled 2017-12-06: qty 20

## 2017-12-06 MED ORDER — DEXAMETHASONE SODIUM PHOSPHATE 10 MG/ML IJ SOLN
INTRAMUSCULAR | Status: AC
  Filled 2017-12-06: qty 1

## 2017-12-06 MED ORDER — SODIUM CHLORIDE 0.9 % IV SOLN
1800.0000 mg/m2 | INTRAVENOUS | Status: DC
  Administered 2017-12-06: 4150 mg via INTRAVENOUS
  Filled 2017-12-06: qty 83

## 2017-12-06 NOTE — Patient Instructions (Signed)
[facility] Discharge Instructions for Patients Receiving Chemotherapy  Today you received the following chemotherapy agents Irinotecan, Leucovorin, Adrucil  To help prevent nausea and vomiting after your treatment, we encourage you to take your nausea medication as directed.   If you develop nausea and vomiting that is not controlled by your nausea medication, call the clinic.   BELOW ARE SYMPTOMS THAT SHOULD BE REPORTED IMMEDIATELY:  *FEVER GREATER THAN 100.5 F  *CHILLS WITH OR WITHOUT FEVER  NAUSEA AND VOMITING THAT IS NOT CONTROLLED WITH YOUR NAUSEA MEDICATION  *UNUSUAL SHORTNESS OF BREATH  *UNUSUAL BRUISING OR BLEEDING  TENDERNESS IN MOUTH AND THROAT WITH OR WITHOUT PRESENCE OF ULCERS  *URINARY PROBLEMS  *BOWEL PROBLEMS  UNUSUAL RASH Items with * indicate a potential emergency and should be followed up as soon as possible.  Feel free to call the clinic should you have any questions or concerns. The clinic phone number is [phone].  Please show the CHEMO ALERT CARD at check-in to the Emergency Department and triage nurse.   

## 2017-12-06 NOTE — Progress Notes (Deleted)
[place]  [phone] [phone]  Clinic Follow up Note   Patient Care Team: Janith Lima, MD as PCP - General (Internal Medicine) 12/06/2017  SUMMARY OF ONCOLOGIC HISTORY:   Adenocarcinoma ([place])   10/20/2016 Imaging     MRI lumbar spine 10/20/2016 with bilateral hydronephrosis and retroperitoneal adenopathy       10/20/2016 Imaging    CT abdomen/pelvis 10/20/2016 with extensive retroperitoneal process with marked interstitial thickening and lymphadenopathy; bilateral hydronephrosis; similar ill-defined soft tissue density and skin thickening around the umbilicus; diffuse bladder wall thickening slightly asymmetric at the bladder dome but no obvious bladder mass      11/10/2016 Pathology Results    Biopsy para-aortic lymph node 11/10/2016-metastatic adenocarcinoma consistent with GI primary       Rectal cancer ([place])   11/27/2016 Initial Diagnosis    Rectal cancer ([place])      12/06/2017 -  Chemotherapy    The patient had palonosetron (ALOXI) injection 0.25 mg, 0.25 mg, Intravenous,  Once, 0 of 4 cycles leucovorin 460 mg in dextrose 5 % 250 mL infusion, 200 mg/m2 = 460 mg (100 % of original dose 200 mg/m2), Intravenous,  Once, 0 of 4 cycles Dose modification: 200 mg/m2 (original dose 200 mg/m2, Cycle 1, Reason: Provider Judgment) oxaliplatin (ELOXATIN) 150 mg in dextrose 5 % 500 mL chemo infusion, 65 mg/m2 = 150 mg (100 % of original dose 65 mg/m2), Intravenous,  Once, 0 of 4 cycles Dose modification: 65 mg/m2 (original dose 65 mg/m2, Cycle 1, Reason: Provider Judgment) fluorouracil (ADRUCIL) 4,150 mg in sodium chloride 0.9 % 67 mL chemo infusion, 1,800 mg/m2 = 4,150 mg (100 % of original dose 1,800 mg/m2), Intravenous, 1 Day/Dose, 0 of 4 cycles Dose modification: 1,800 mg/m2 (original dose 1,800 mg/m2, Cycle 1, Reason: Provider Judgment)  for chemotherapy treatment.        INTERVAL HISTORY: Please see below for problem oriented  charting.  REVIEW OF SYSTEMS:   Constitutional: Denies fevers, chills or abnormal weight loss Eyes: Denies blurriness of vision Ears, nose, mouth, throat, and face: Denies mucositis or sore throat Respiratory: Denies cough, dyspnea or wheezes Cardiovascular: Denies palpitation, chest discomfort or lower extremity swelling Gastrointestinal:  Denies nausea, heartburn or change in bowel habits Skin: Denies abnormal skin rashes Lymphatics: Denies new lymphadenopathy or easy bruising Neurological:Denies numbness, tingling or new weaknesses Behavioral/Psych: Mood is stable, no new changes  All other systems were reviewed with the patient and are negative.  MEDICAL HISTORY:  Past Medical History:  Diagnosis Date  . Allergic rhinitis, seasonal   . Anemia   . Bilateral hydrocele   . Bilateral hydronephrosis   . Chronic constipation   . Edema of right lower extremity   . 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 Wears glasses     SURGICAL HISTORY: Past Surgical History:  Procedure Laterality Date  . COLONOSCOPY WITH ESOPHAGOGASTRODUODENOSCOPY (EGD)  11-27-2016  dr Earlean Shawl   rectal mass  . CYSTOSCOPY WITH BIOPSY N/A 12/11/2016   Procedure: CYSTOSCOPY WITH BIOPSY;  Surgeon: Kathie Rhodes, MD;  Location: [facility];  Service: Urology;  Laterality: N/A;  . IR FLUORO GUIDE PORT INSERTION RIGHT  12/05/2016  . IR FLUORO GUIDE PORT INSERTION RIGHT  08/14/2017  . IR REMOVAL TUN ACCESS W/ PORT W/O FL MOD SED  01/31/2017  . IR US GUIDE VASC ACCESS RIGHT  12/05/2016  . IR US  GUIDE VASC ACCESS RIGHT  08/14/2017  . TEE WITHOUT CARDIOVERSION N/A 01/30/2017   Procedure: TRANSESOPHAGEAL ECHOCARDIOGRAM (TEE);  Surgeon: Acie Fredrickson Wonda Cheng, MD;  Location: [facility] ENDOSCOPY;  Service: Cardiovascular;  Laterality: N/A;  . UMBILICAL HERNIA REPAIR  05/2016    I have reviewed the social history  and family history with the patient and they are unchanged from previous note.  ALLERGIES:  has No Known Allergies.  MEDICATIONS:  Current Outpatient Medications  Medication Sig Dispense Refill  . aspirin 325 MG tablet Take 325 mg by mouth once a week.     . Azelastine-Fluticasone (DYMISTA NA) Place into the nose.    . fluticasone (CUTIVATE) 0.05 % cream Apply topically daily as needed. Apply to affected areas. (Patient not taking: Reported on 12/03/2017) 30 g 0  . lidocaine-prilocaine (EMLA) cream Apply 1 application topically as needed.    . meloxicam (MOBIC) 15 MG tablet Take 1 tablet (15 mg total) by mouth daily. 90 tablet 1  . minocycline (MINOCIN) 100 MG capsule Take 1 capsule (100 mg total) by mouth 2 (two) times daily. 60 capsule 4  . Misc Natural Products (GLUCOSAMINE CHOND COMPLEX/MSM PO) Take 1 tablet by mouth daily.    . Multiple Vitamin (MULTIVITAMIN) tablet Take 1 tablet by mouth every 3 (three) days.     . Omega-3 Fatty Acids (FISH OIL) 1200 MG CAPS Take by mouth every 3 (three) days.     . pantoprazole (PROTONIX) 40 MG tablet Take 1 tablet (40 mg total) by mouth 2 (two) times daily. 60 tablet 0  . Polyethylene Glycol 3350 (MIRALAX PO) Take 17 g by mouth daily as needed (constipation).     . prochlorperazine (COMPAZINE) 10 MG tablet Take 1 tablet (10 mg total) by mouth every 6 (six) hours as needed for nausea or vomiting. (Patient not taking: Reported on 12/03/2017) 30 tablet 0  . saw palmetto 80 MG capsule Take 80 mg by mouth every 3 (three) days.     . sucralfate (CARAFATE) 1 GM/10ML suspension Take 10 mLs (1 g total) by mouth 4 (four) times daily -  with meals and at bedtime. 420 mL 0  . traMADol (ULTRAM) 50 MG tablet Take 1 tablet (50 mg total) by mouth every 6 (six) hours as needed for moderate pain. 50 tablet 0  . VIRTUSSIN A/C 100-10 MG/5ML syrup TK 10 ML PO HS  0   No current facility-administered medications for this visit.     PHYSICAL EXAMINATION: ECOG PERFORMANCE  STATUS: {CHL ONC ECOG PS:[id number]}  There were no vitals filed for this visit. There were no vitals filed for this visit.  GENERAL:alert, no distress and comfortable SKIN: skin color, texture, turgor are normal, no rashes or significant lesions EYES: normal, Conjunctiva are pink and non-injected, sclera clear OROPHARYNX:no exudate, no erythema and lips, buccal mucosa, and tongue normal  NECK: supple, thyroid normal size, non-tender, without nodularity LYMPH:  no palpable lymphadenopathy in the cervical, axillary or inguinal LUNGS: clear to auscultation and percussion with normal breathing effort HEART: regular rate & rhythm and no murmurs and no lower extremity edema ABDOMEN:abdomen soft, non-tender and normal bowel sounds Musculoskeletal:no cyanosis of digits and no clubbing  NEURO: alert & oriented x 3 with fluent speech, no focal motor/sensory deficits  LABORATORY DATA:  I have reviewed the data as listed CBC Latest Ref Rng & Units 11/26/2017 11/12/2017 10/29/2017  WBC 4.0 - 10.5 K/uL 4.7 6.2 4.3  Hemoglobin 13.0 - 17.0 g/dL 12.3(L) 12.9(L) 12.7(L)  Hematocrit 39.0 -  52.0 % 36.7(L) 38.9 38.3(L)  Platelets 150 - 400 K/uL 149(L) 156 140     CMP Latest Ref Rng & Units 11/26/2017 11/12/2017 10/29/2017  Glucose 65 - 99 mg/dL 108(H) 136 135  BUN 6 - 20 mg/dL 19 17 13   Creatinine 0.61 - 1.24 mg/dL 1.04 0.86 0.85  Sodium 135 - 145 mmol/L 139 141 140  Potassium 3.5 - 5.1 mmol/L 4.3 4.2 4.3  Chloride 101 - 111 mmol/L 108 109 109  CO2 22 - 32 mmol/L 26 25 26   Calcium 8.9 - 10.3 mg/dL 9.5 9.7 9.8  Total Protein 6.5 - 8.1 g/dL 6.0(L) 6.2(L) 6.1(L)  Total Bilirubin 0.3 - 1.2 mg/dL 0.4 0.4 0.6  Alkaline Phos 38 - 126 U/L 84 118 121  AST 15 - 41 U/L 22 18 23   ALT 17 - 63 U/L 14(L) 13 20      RADIOGRAPHIC STUDIES: I have personally reviewed the radiological images as listed and agreed with the findings in the report. No results found.   ASSESSMENT & PLAN:  No problem-specific Assessment  & Plan notes found for this encounter.   No orders of the defined types were placed in this encounter.  All questions were answered. The patient knows to call the clinic with any problems, questions or concerns. No barriers to learning was detected. I spent {CHL ONC TIME VISIT - QIONG:[id number]} counseling the patient face to face. The total time spent in the appointment was {CHL ONC TIME VISIT - MWNUU:[id number]} and more than 50% was on counseling and review of test results     Alla Feeling, NP 12/06/17

## 2017-12-07 NOTE — Progress Notes (Signed)
Nicholas Suarez was seen in the infusion room yesterday with a report that he has been having drainage along the cuticle lateral, and medial edge of his toenails on his great toes bilaterally.  He had previously been treated with minocycline which he finished on 11/27/2017.  He has had a recurrence of drainage at the sites since he has finished his antibiotic.  He denies fevers, chills, or sweats.  He will be given a prescription for Bactroban 2% ointment today and will use this 4 times daily until the area of drainage improves.  Sandi Mealy, MHS, PA-C Physician Assistant

## 2017-12-08 ENCOUNTER — Inpatient Hospital Stay: Payer: Medicare Other

## 2017-12-08 VITALS — BP 132/79 | HR 99 | Temp 97.7°F | Resp 16

## 2017-12-08 DIAGNOSIS — C20 Malignant neoplasm of rectum: Secondary | ICD-10-CM

## 2017-12-08 DIAGNOSIS — Z5111 Encounter for antineoplastic chemotherapy: Secondary | ICD-10-CM | POA: Diagnosis not present

## 2017-12-08 MED ORDER — HEPARIN SOD (PORK) LOCK FLUSH 100 UNIT/ML IV SOLN
500.0000 [IU] | Freq: Once | INTRAVENOUS | Status: AC | PRN
  Administered 2017-12-08: 500 [IU]
  Filled 2017-12-08: qty 5

## 2017-12-08 MED ORDER — SODIUM CHLORIDE 0.9% FLUSH
10.0000 mL | INTRAVENOUS | Status: DC | PRN
  Administered 2017-12-08: 10 mL
  Filled 2017-12-08: qty 10

## 2017-12-10 ENCOUNTER — Emergency Department (HOSPITAL_COMMUNITY)
Admission: EM | Admit: 2017-12-10 | Discharge: 2017-12-10 | Disposition: A | Discharge status: Home / Self Care | Payer: Medicare Other | Attending: Emergency Medicine | Admitting: Emergency Medicine

## 2017-12-10 ENCOUNTER — Other Ambulatory Visit: Payer: Self-pay

## 2017-12-10 ENCOUNTER — Telehealth: Payer: Self-pay

## 2017-12-10 ENCOUNTER — Emergency Department (HOSPITAL_COMMUNITY): Payer: Medicare Other

## 2017-12-10 ENCOUNTER — Encounter (HOSPITAL_COMMUNITY): Payer: Self-pay

## 2017-12-10 DIAGNOSIS — Z7982 Long term (current) use of aspirin: Secondary | ICD-10-CM | POA: Insufficient documentation

## 2017-12-10 DIAGNOSIS — Z79899 Other long term (current) drug therapy: Secondary | ICD-10-CM | POA: Diagnosis not present

## 2017-12-10 DIAGNOSIS — Z87891 Personal history of nicotine dependence: Secondary | ICD-10-CM | POA: Insufficient documentation

## 2017-12-10 DIAGNOSIS — J45909 Unspecified asthma, uncomplicated: Secondary | ICD-10-CM | POA: Insufficient documentation

## 2017-12-10 DIAGNOSIS — K5900 Constipation, unspecified: Secondary | ICD-10-CM | POA: Diagnosis present

## 2017-12-10 LAB — COMPREHENSIVE METABOLIC PANEL
ALBUMIN: 3.3 g/dL — AB (ref 3.5–5.0)
ALK PHOS: 93 U/L (ref 38–126)
ALT: 13 U/L (ref 0–44)
AST: 22 U/L (ref 15–41)
Anion gap: 6 (ref 5–15)
BUN: 12 mg/dL (ref 8–23)
CALCIUM: 9.7 mg/dL (ref 8.9–10.3)
CO2: 27 mmol/L (ref 22–32)
CREATININE: 0.89 mg/dL (ref 0.61–1.24)
Chloride: 106 mmol/L (ref 98–111)
GFR calc Af Amer: >60 mL/min (ref >60)
GFR calc non Af Amer: >60 mL/min (ref >60)
GLUCOSE: 108 mg/dL — AB (ref 70–99)
Potassium: 3.8 mmol/L (ref 3.5–5.1)
SODIUM: 139 mmol/L (ref 135–145)
Total Bilirubin: 0.7 mg/dL (ref 0.3–1.2)
Total Protein: 6.1 g/dL — ABNORMAL LOW (ref 6.5–8.1)

## 2017-12-10 LAB — CBC WITH DIFFERENTIAL/PLATELET
BASOS PCT: 0 %
Basophils Absolute: 0 10*3/uL (ref 0.0–0.1)
EOS ABS: 0.2 10*3/uL (ref 0.0–0.7)
Eosinophils Relative: 4 %
HEMATOCRIT: 36.8 % — AB (ref 39.0–52.0)
Hemoglobin: 12.4 g/dL — ABNORMAL LOW (ref 13.0–17.0)
Lymphocytes Relative: 10 %
Lymphs Abs: 0.5 10*3/uL — ABNORMAL LOW (ref 0.7–4.0)
MCH: 30.9 pg (ref 26.0–34.0)
MCHC: 33.7 g/dL (ref 30.0–36.0)
MCV: 91.8 fL (ref 78.0–100.0)
MONO ABS: 0.3 10*3/uL (ref 0.1–1.0)
MONOS PCT: 6 %
NEUTROS ABS: 4.4 10*3/uL (ref 1.7–7.7)
Neutrophils Relative %: 80 %
Platelets: 175 10*3/uL (ref 150–400)
RBC: 4.01 MIL/uL — ABNORMAL LOW (ref 4.22–5.81)
RDW: 12.9 % (ref 11.5–15.5)
WBC: 5.5 10*3/uL (ref 4.0–10.5)

## 2017-12-10 MED ORDER — ONDANSETRON HCL 4 MG/2ML IJ SOLN
4.0000 mg | Freq: Once | INTRAMUSCULAR | Status: AC
  Administered 2017-12-10: 4 mg via INTRAVENOUS
  Filled 2017-12-10: qty 2

## 2017-12-10 MED ORDER — GI COCKTAIL ~~LOC~~
30.0000 mL | Freq: Once | ORAL | Status: AC
  Administered 2017-12-10: 30 mL via ORAL
  Filled 2017-12-10: qty 30

## 2017-12-10 NOTE — ED Triage Notes (Signed)
Pt arrives via EMS from home. Pt reports constipation with no BM in 6 days. Pt has colorectal, bladder cancer. Pt is had 1st treatment of the 3rd round of chemo on Thursday. Pt has been taking Miralax and seena for 3 days. No relief. Pt reports trying a Fleet's enema today without success. Pt also tried mag citrate but was unable to tolerate and had one episode of vomiting. Pt reports that the tumor is close to rectum.

## 2017-12-10 NOTE — ED Provider Notes (Signed)
Nicholas Suarez Provider Note   CSN: [id number] Arrival date & time: 12/10/17  1811     History   Chief Complaint Chief Complaint  Patient presents with  . Constipation    HPI Nicholas Suarez is a 67 y.o. male.  HPI Patient presents with constipation.  Last bowel movement 6 days ago.  Usually goes about every other day.  He is on chemotherapy for colorectal bladder cancer.  Most recent treatment was Thursday with today being Monday.  He has been on this regimen before but has not been his most recent regimine.  Also had some ascites.  Swelling his abdomen and scrotum is gotten worse.  Has had MiraLAX and senna without relief.  Also tried a Fleet enema but states that just came right back out.  Has rectal tumor. Past Medical History:  Diagnosis Date  . Allergic rhinitis, seasonal   . Anemia   . Bilateral hydrocele   . Bilateral hydronephrosis   . Chronic constipation   . Edema of right lower extremity   . 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 Wears glasses     Patient Active Problem List   Diagnosis Date Noted  . 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 Chronic pain of both shoulders 06/11/2017  . Normocytic anemia 01/29/2017  . Thrombocytopenia ([place]) 01/29/2017  . MSSA bacteremia 01/29/2017  . Bacteremia   . Cellulitis 01/28/2017  . Port-A-Cath in place 01/17/2017  . Goals of care, counseling/discussion 11/30/2016  . Rectal cancer ([place]) 11/27/2016  . Adenocarcinoma ([place]) 11/17/2016  . Right leg swelling 11/14/2016  . Retroperitoneal lymphadenopathy 11/01/2016  . Hyperlipidemia LDL goal <130 11/01/2016  . Hydronephrosis 11/01/2016  . Allergic rhinitis 02/18/2015  . Asthma   . Routine general medical examination at a health care facility 05/23/2012    Past Surgical History:  Procedure Laterality  Date  . COLONOSCOPY WITH ESOPHAGOGASTRODUODENOSCOPY (EGD)  11-27-2016  dr Earlean Shawl   rectal mass  . CYSTOSCOPY WITH BIOPSY N/A 12/11/2016   Procedure: CYSTOSCOPY WITH BIOPSY;  Surgeon: Kathie Rhodes, MD;  Location: [facility];  Service: Urology;  Laterality: N/A;  . IR FLUORO GUIDE PORT INSERTION RIGHT  12/05/2016  . IR FLUORO GUIDE PORT INSERTION RIGHT  08/14/2017  . IR REMOVAL TUN ACCESS W/ PORT W/O FL MOD SED  01/31/2017  . IR US GUIDE VASC ACCESS RIGHT  12/05/2016  . IR US GUIDE VASC ACCESS RIGHT  08/14/2017  . TEE WITHOUT CARDIOVERSION N/A 01/30/2017   Procedure: TRANSESOPHAGEAL ECHOCARDIOGRAM (TEE);  Surgeon: Acie Fredrickson Wonda Cheng, MD;  Location: [facility] ENDOSCOPY;  Service: Cardiovascular;  Laterality: N/A;  . UMBILICAL HERNIA REPAIR  05/2016        Home Medications    Prior to Admission medications   Medication Sig Start Date End Date Taking? Authorizing Provider  aspirin 325 MG tablet Take 325 mg by mouth once a week.     [provider]  Azelastine-Fluticasone (DYMISTA NA) Place into the nose.    [provider]  fluticasone (CUTIVATE) 0.05 % cream Apply topically daily as needed. Apply to affected areas. Patient not taking: Reported on 12/03/2017 09/03/17   Ladell Pier, MD  lidocaine-prilocaine (EMLA) cream Apply 1 application topically as needed.    [provider]  meloxicam (MOBIC) 15 MG tablet Take 1 tablet (15 mg total) by mouth daily. 06/11/17   Scarlette Calico  L, MD  minocycline (MINOCIN) 100 MG capsule Take 1 capsule (100 mg total) by mouth 2 (two) times daily. 08/06/17   [name], NP  Misc Natural Products (GLUCOSAMINE CHOND COMPLEX/MSM PO) Take 1 tablet by mouth daily.    [provider]  Multiple Vitamin (MULTIVITAMIN) tablet Take 1 tablet by mouth every 3 (three) days.     [provider]  mupirocin ointment (BACTROBAN) 2 % Apply to area surrounding each great toe nail 4 times daily 12/06/17   Harle Stanford., PA-C    Omega-3 Fatty Acids (FISH OIL) 1200 MG CAPS Take by mouth every 3 (three) days.     [provider]  pantoprazole (PROTONIX) 40 MG tablet Take 1 tablet (40 mg total) by mouth 2 (two) times daily. 11/26/17   Charlann Lange, PA-C  Polyethylene Glycol 3350 (MIRALAX PO) Take 17 g by mouth daily as needed (constipation).     [provider]  prochlorperazine (COMPAZINE) 10 MG tablet Take 1 tablet (10 mg total) by mouth every 6 (six) hours as needed for nausea or vomiting. Patient not taking: Reported on 12/03/2017 11/30/16   [name], NP  saw palmetto 80 MG capsule Take 80 mg by mouth every 3 (three) days.     [provider]  sucralfate (CARAFATE) 1 GM/10ML suspension Take 10 mLs (1 g total) by mouth 4 (four) times daily -  with meals and at bedtime. 11/26/17   Charlann Lange, PA-C  traMADol (ULTRAM) 50 MG tablet Take 1 tablet (50 mg total) by mouth every 6 (six) hours as needed for moderate pain. 12/03/17   Ladell Pier, MD  VIRTUSSIN A/C 100-10 MG/5ML syrup TK 10 ML PO HS 10/31/17   [provider]    Family History Family History  Problem Relation Age of Onset  . Breast cancer Mother   . Alcohol abuse Father   . Heart disease Other   . Cancer Other        Breast Cancer  . Arthritis Other   . Alcohol abuse Other   . Drug abuse Other   . Stroke Neg Hx   . Kidney disease Neg Hx   . Hypertension Neg Hx   . Hyperlipidemia Neg Hx   . Early death Neg Hx   . Colon cancer Neg Hx   . Esophageal cancer Neg Hx   . Rectal cancer Neg Hx   . Stomach cancer Neg Hx     Social History Social History   Tobacco Use  . Smoking status: Former Smoker    Packs/day: 1.00    Years: 10.00    Pack years: 10.00    Types: Cigarettes, Pipe, Cigars    Last attempt to quit: 06/12/1988    Years since quitting: 29.5  . Smokeless tobacco: Former Systems developer    Types: Chew    Quit date: 06/12/1993  Substance Use Topics  . Alcohol use: No  . Drug use: No     Allergies    Patient has no known allergies.   Review of Systems Review of Systems  Constitutional: Negative for appetite change.  HENT: Negative for congestion.   Respiratory: Negative for shortness of breath.   Gastrointestinal: Positive for abdominal distention, constipation and vomiting.       Vomited once after taking magnesium citrate.  Genitourinary: Positive for scrotal swelling.  Musculoskeletal: Negative for back pain.  Skin: Negative for rash.  Neurological: Negative for weakness.  Psychiatric/Behavioral: Negative for confusion.  Physical Exam Updated Vital Signs BP (!) 146/87   Pulse 96   Temp 98.6 F (37 C) (Oral) Comment: Simultaneous filing. User may not have seen previous data. Comment (Src): Simultaneous filing. User may not have seen previous data.  Resp 16   Wt 97.5 kg (215 lb)   SpO2 100%   BMI 26.87 kg/m   Physical Exam  Constitutional: He appears well-developed.  HENT:  Head: Normocephalic.  Eyes: EOM are normal.  Cardiovascular: Normal rate.  Pulmonary/Chest: Effort normal.  Abdominal: He exhibits distension.  Mild distention without frank tenderness.  Genitourinary:  Genitourinary Comments: Penile and scrotal edema some erythema without clear infection.  No hernia palpated.  Musculoskeletal: He exhibits edema.  Edema bilateral lower extremities.  Neurological: He is alert.  Skin: Skin is warm. Capillary refill takes less than 2 seconds.     ED Treatments / Results  Labs (all labs ordered are listed, but only abnormal results are displayed) Labs Reviewed  COMPREHENSIVE METABOLIC PANEL - Abnormal; Notable for the following components:      Result Value   Glucose, Bld 108 (*)    Total Protein 6.1 (*)    Albumin 3.3 (*)    All other components within normal limits  CBC WITH DIFFERENTIAL/PLATELET - Abnormal; Notable for the following components:   RBC 4.01 (*)    Hemoglobin 12.4 (*)    HCT 36.8 (*)    Lymphs Abs 0.5 (*)    All other components  within normal limits    EKG None  Radiology Dg Abdomen Acute W/chest  Result Date: 12/10/2017 CLINICAL DATA:  Abdominal pain for 5 days with constipation. EXAM: DG ABDOMEN ACUTE W/ 1V CHEST COMPARISON:  11/26/2017 abdominal/pelvic CT and priors FINDINGS: There is no evidence of dilated bowel loops or free intraperitoneal air. No radiopaque calculi or other significant radiographic abnormality is seen. Heart size and mediastinal contours are within normal limits. Both lungs are clear. Small to moderate amount of colonic stool noted. IMPRESSION: Small to moderate amount of colonic stool without other significant abnormality. Electronically Signed   By: Margarette Canada M.D.   On: 12/10/2017 19:40    Procedures Procedures (including critical care time)  Medications Ordered in ED Medications  gi cocktail (Maalox,Lidocaine,Donnatal) (30 mLs Oral Given 12/10/17 2050)  ondansetron (ZOFRAN) injection 4 mg (4 mg Intravenous Given 12/10/17 2050)     Initial Impression / Assessment and Plan / ED Course  I have reviewed the triage vital signs and the nursing notes.  Pertinent labs & imaging results that were available during my care of the patient were reviewed by me and considered in my medical decision making (see chart for details).     Patient with constipation.  History of rectal cancer.  Decreased bowel movements.  Lab work reassuring.  He is on chemotherapy.  X-ray reassuring without clear obstruction or severe amount of stool.  Rectal exam showed no obstruction.  Discharge home to follow-up with oncology as needed. Final Clinical Impressions(s) / ED Diagnoses   Final diagnoses:  Constipation, unspecified constipation type    ED Discharge Orders    None       Davonna Belling, MD 12/10/17 [id number]

## 2017-12-10 NOTE — Telephone Encounter (Signed)
Call from pt wife stating that pt "hasn't been to the bathroom in 5 days. He's miserable. He isn't eating or drinking much because he's hurting". Per MD, pt to try fleet enema and if no relief, pt to try magnesium citrate. Pt and wife voiced understanding. Pt wife also states "if he doesn't get any better after that, I'm calling the ambulance". This RN voiced understanding and agreement. Pt will keep [place] updated.

## 2017-12-11 ENCOUNTER — Telehealth: Payer: Self-pay

## 2017-12-11 NOTE — Telephone Encounter (Signed)
Spoke with pt for an update on condition. Pt reported that he "feels much better today". Pt informed us that he will begin taking Colace and more Miralax per ED MD. This RN voiced understanding, will make Dr. Benay Spice aware

## 2017-12-19 ENCOUNTER — Encounter: Payer: Self-pay | Admitting: Pharmacist

## 2017-12-21 ENCOUNTER — Inpatient Hospital Stay: Payer: Medicare Other

## 2017-12-21 ENCOUNTER — Encounter: Payer: Self-pay | Admitting: Nurse Practitioner

## 2017-12-21 ENCOUNTER — Inpatient Hospital Stay: Payer: Medicare Other | Attending: Oncology

## 2017-12-21 ENCOUNTER — Inpatient Hospital Stay (HOSPITAL_BASED_OUTPATIENT_CLINIC_OR_DEPARTMENT_OTHER): Payer: Medicare Other | Admitting: Nurse Practitioner

## 2017-12-21 ENCOUNTER — Telehealth: Payer: Self-pay | Admitting: Oncology

## 2017-12-21 VITALS — BP 121/72 | HR 80 | Temp 97.7°F | Resp 18 | Ht 75.0 in | Wt 211.4 lb

## 2017-12-21 DIAGNOSIS — R911 Solitary pulmonary nodule: Secondary | ICD-10-CM | POA: Diagnosis not present

## 2017-12-21 DIAGNOSIS — Z95828 Presence of other vascular implants and grafts: Secondary | ICD-10-CM

## 2017-12-21 DIAGNOSIS — Z5111 Encounter for antineoplastic chemotherapy: Secondary | ICD-10-CM | POA: Diagnosis present

## 2017-12-21 DIAGNOSIS — C20 Malignant neoplasm of rectum: Secondary | ICD-10-CM | POA: Diagnosis present

## 2017-12-21 DIAGNOSIS — C786 Secondary malignant neoplasm of retroperitoneum and peritoneum: Secondary | ICD-10-CM

## 2017-12-21 DIAGNOSIS — C7989 Secondary malignant neoplasm of other specified sites: Secondary | ICD-10-CM

## 2017-12-21 DIAGNOSIS — C674 Malignant neoplasm of posterior wall of bladder: Secondary | ICD-10-CM | POA: Diagnosis not present

## 2017-12-21 DIAGNOSIS — R609 Edema, unspecified: Secondary | ICD-10-CM

## 2017-12-21 LAB — CMP (CANCER CENTER ONLY)
ALT: 12 U/L (ref 0–44)
AST: 18 U/L (ref 15–41)
Albumin: 3.4 g/dL — ABNORMAL LOW (ref 3.5–5.0)
Alkaline Phosphatase: 110 U/L (ref 38–126)
Anion gap: 5 (ref 5–15)
BILIRUBIN TOTAL: 0.3 mg/dL (ref 0.3–1.2)
BUN: 16 mg/dL (ref 8–23)
CHLORIDE: 108 mmol/L (ref 98–111)
CO2: 26 mmol/L (ref 22–32)
Calcium: 10.1 mg/dL (ref 8.9–10.3)
Creatinine: 0.91 mg/dL (ref 0.61–1.24)
GFR, Est AFR Am: >60 mL/min (ref >60)
GFR, Estimated: >60 mL/min (ref >60)
GLUCOSE: 121 mg/dL — AB (ref 70–99)
POTASSIUM: 4.4 mmol/L (ref 3.5–5.1)
Sodium: 139 mmol/L (ref 135–145)
TOTAL PROTEIN: 6.4 g/dL — AB (ref 6.5–8.1)

## 2017-12-21 LAB — CBC WITH DIFFERENTIAL (CANCER CENTER ONLY)
BASOS ABS: 0 10*3/uL (ref 0.0–0.1)
Basophils Relative: 1 %
Eosinophils Absolute: 0.3 10*3/uL (ref 0.0–0.5)
Eosinophils Relative: 6 %
HEMATOCRIT: 36.4 % — AB (ref 38.4–49.9)
Hemoglobin: 12 g/dL — ABNORMAL LOW (ref 13.0–17.1)
LYMPHS ABS: 0.6 10*3/uL — AB (ref 0.9–3.3)
LYMPHS PCT: 15 %
MCH: 30.8 pg (ref 27.2–33.4)
MCHC: 33.1 g/dL (ref 32.0–36.0)
MCV: 93.1 fL (ref 79.3–98.0)
MONO ABS: 0.3 10*3/uL (ref 0.1–0.9)
Monocytes Relative: 7 %
Neutro Abs: 3.2 10*3/uL (ref 1.5–6.5)
Neutrophils Relative %: 71 %
Platelet Count: 161 10*3/uL (ref 140–400)
RBC: 3.91 MIL/uL — AB (ref 4.20–5.82)
RDW: 14 % (ref 11.0–14.6)
WBC: 4.4 10*3/uL (ref 4.0–10.3)

## 2017-12-21 MED ORDER — SODIUM CHLORIDE 0.9% FLUSH
10.0000 mL | Freq: Once | INTRAVENOUS | Status: AC
  Administered 2017-12-21: 10 mL
  Filled 2017-12-21: qty 10

## 2017-12-21 MED ORDER — HEPARIN SOD (PORK) LOCK FLUSH 100 UNIT/ML IV SOLN
250.0000 [IU] | Freq: Once | INTRAVENOUS | Status: AC
  Administered 2017-12-21: 250 [IU]
  Filled 2017-12-21: qty 5

## 2017-12-21 MED ORDER — PANTOPRAZOLE SODIUM 40 MG PO TBEC: 40.0000 mg | DELAYED_RELEASE_TABLET | Freq: Two times a day (BID) | ORAL | 0 refills | Status: DC

## 2017-12-21 NOTE — Telephone Encounter (Signed)
Appointments scheduled calendar/AVS printed / Message sent to Dr Benay Spice to ask if he could see the patient in infusion on 7/29 per 7/12 los

## 2017-12-21 NOTE — Progress Notes (Addendum)
[place] OFFICE PROGRESS NOTE   Diagnosis: Colon cancer  INTERVAL HISTORY:   Nicholas Suarez returns as scheduled.  He completed cycle 1 FOLFOX 12/06/2017.  He denies nausea/vomiting.  No mouth sores.  No diarrhea.  Neuropathy symptoms are stable.  He was evaluated in the emergency department on 12/10/2017 due to constipation.  He is now on a laxative regimen consisting of MiraLAX and Colace.  Bowels moving regularly.  He continues to have scrotal edema.  Objective:  Vital signs in last 24 hours:  Blood pressure 121/72, pulse 80, temperature 97.7 F (36.5 C), temperature source Oral, resp. rate 18, height '6\' 3"'  (1.905 m), weight 211 lb 6.4 oz (95.9 kg), SpO2 100 %.    HEENT: No thrush or ulcers. Resp: Lungs clear bilaterally. Cardio: Regular rate and rhythm. GI: Abdomen soft and nontender.  No hepatomegaly. Vascular: Trace edema at the left ankle.  Right leg edema.  Skin: Erythematous nodular lesions at the right upper anterior chest and near the left clavicle.  Right great toe with paronychia. Port-A-Cath without erythema.  Lab Results:  Lab Results  Component Value Date   WBC 4.4 12/21/2017   HGB 12.0 (L) 12/21/2017   HCT 36.4 (L) 12/21/2017   MCV 93.1 12/21/2017   PLT 161 12/21/2017   NEUTROABS 3.2 12/21/2017    Imaging:  No results found.  Medications: I have reviewed the patient's current medications.  Assessment/Plan: 1. Rectal cancer  MRI lumbar spine 10/20/2016 with bilateral hydronephrosis and retroperitoneal adenopathy  CT abdomen/pelvis 10/20/2016 with extensive retroperitoneal process with marked interstitial thickening and lymphadenopathy; bilateral hydronephrosis; similar ill-defined soft tissue density and skin thickening around the umbilicus; diffuse bladder wall thickening slightly asymmetric at the bladder dome but no obvious bladder mass  Biopsy para-aortic lymph node 11/10/2016-metastatic adenocarcinoma consistent with GI  primary  11/23/2016 CEA elevated, CA-19-9 normal  Colonoscopy 11/27/2016-fungating infiltrative nonobstructing mass in the distal rectum involving the anal verge. The rectum was fixed and frozen in the pelvis precluding passage of scope beyond the distal sigmoid colon. 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  Foundation 1-microsatellite stable; tumor mutational burden 5; NOTCH3amplification; NOTCH3rearrangement exon 14; [id number]  Upper endoscopy 11/27/2016-normal.  PET scan 06/22/2018showed minimal activity in the primary rectal carcinoma, diffuse wall thickening; mild activity associate with retroperitoneal lymph nodes; persistent haziness within the retroperitoneum. No evidence of liver metastasis or distant metastasis.  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  Cycle 2 FOLFOX 12/20/2016  Cycle 3 FOLFOX 01/03/2017 (oxaliplatin dose reduced secondary to thrombocytopenia)   Cycle 4 FOLFOX 01/17/2017 (oxaliplatin held due to thrombocytopenia)  Cycle 5 FOLFOX 02/15/2017  CTs 02/26/2017-new sclerotic lesions in the spine and right iliac, no residual adenopathy, persistent rectal wall thickening  Cycle 6 FOLFOX 03/01/2017 (Neulasta added, oxaliplatin further dose reduced)   Cycle 7 FOLFOX 03/15/2017 (oxaliplatin held secondary to thrombocytopenia)  Cycle 8 FOLFOX 03/29/2017  Cycle 9 FOLFOX 04/12/2017 (oxaliplatin held secondary to thrombocytopenia and neuropathy)  Cycle 10 FOLFOX 04/25/2017 (oxaliplatin held secondary to neuropathy)  CTs 05/07/2017-stable rectal thickening, stable periaortic soft tissue, stable sclerotic lesions at the lumbar spine and pelvis  Treatment changed to maintenance capecitabine beginning 05/14/2017  CTs 08/03/2017-irregular annular wall  thickening mid to lower rectum appears mildly increased; newly enlarged infiltrative retroperitoneal nodal metastasis; mild left supraclavicular adenopathy appears minimally increased; ill-defined umbilical soft tissue thickening stable; small scattered sclerotic bone  lesions stable.  Cycle 1 FOLFIRI/Panitumumab 08/20/2017  Cycle 2 FOLFIRI/Panitumumab 09/10/2017 (irinotecan and 5-FU dose reduced due to mucositis)  Cycle 3 FOLFIRI/panitumumab 09/24/2017  Cycle 4 FOLFIRI/panitumumab 10/08/2017 (5-fluorouracil further dose reduced secondary to mucositis)  Cycle 5 FOLFIRI/Panitumumab 10/29/2017  CTs 11/09/2017- increase in mediastinal, retroperitoneal, and mesenteric adenopathy, increased wall thickening of the rectum, increased sclerotic lesions at the thoracic spine  Cycle 1 FOLFOX 12/06/2017  Cycle 2 FOLFOX 12/24/2017 2. Retroperitoneal lymphadenopathy secondary to #1 3. Bilateral hydronephrosis status post evaluation by urology 4. Asymmetry of the bladder dome on CT 10/20/2016 status post cystoscopy with biopsy planned 12/11/2016 (Dr. 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 Change in bowel habits, secondary to #1-improved. 6. Right leg edema, question due to lymphatic obstruction;venous Doppler negative for DVT 11/23/2016. 7. Port-A-Cath placement 12/05/2016 8. Thrombocytopenia secondary to chemotherapy; oxaliplatin dose reduced with cycle 3 FOLFOX. Progressive thrombocytopenia 01/17/2017; oxaliplatin held 01/17/2017. 9. Right lower extremity cellulitis 01/27/2017-blood culture positive for methicillin sensitive staph aureus; Port-A-Cath removed. PICC line placed. TEE 01/30/2017 without evidence of vegetation. 14 days of Ancef recommended after Port-A-Cath removal with end date 02/14/2017. 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 Mucositis following cycle 1 FOLFIRI/Panitumumab-cycle 2 delayed andirinotecan/5-FU  dose reduced.Improved    Disposition: Nicholas Suarez appears stable.  He has completed 1 cycle of FOLFOX.  Overall he tolerated well.  Plan to proceed with cycle 2 on 12/24/2017.  We will continue the current laxative regimen.  He will return for lab, follow-up and cycle 3 FOLFOX 01/07/2018.  He will contact the office in the interim with any problems.  Patient seen with Dr. Benay Spice.    Ned Card ANP/GNP-BC   12/21/2017  11:16 AM  This was a shared visit with Ned Card.  Nicholas Suarez was interviewed and examined.  He will return for cycle 2 FOLFOX on 12/24/2017.  Julieanne Manson, MD

## 2017-12-23 ENCOUNTER — Other Ambulatory Visit: Payer: Self-pay | Admitting: Oncology

## 2017-12-24 ENCOUNTER — Inpatient Hospital Stay: Payer: Medicare Other

## 2017-12-24 ENCOUNTER — Inpatient Hospital Stay (HOSPITAL_BASED_OUTPATIENT_CLINIC_OR_DEPARTMENT_OTHER): Payer: Medicare Other | Admitting: Medical

## 2017-12-24 VITALS — BP 119/74 | HR 76 | Temp 97.8°F | Resp 17 | Ht 75.0 in | Wt 209.2 lb

## 2017-12-24 DIAGNOSIS — C20 Malignant neoplasm of rectum: Secondary | ICD-10-CM

## 2017-12-24 DIAGNOSIS — R21 Rash and other nonspecific skin eruption: Secondary | ICD-10-CM

## 2017-12-24 DIAGNOSIS — Z5111 Encounter for antineoplastic chemotherapy: Secondary | ICD-10-CM | POA: Diagnosis not present

## 2017-12-24 DIAGNOSIS — L270 Generalized skin eruption due to drugs and medicaments taken internally: Secondary | ICD-10-CM

## 2017-12-24 MED ORDER — DIPHENHYDRAMINE HCL 25 MG PO CAPS
25.0000 mg | ORAL_CAPSULE | Freq: Once | ORAL | Status: AC
  Administered 2017-12-24: 25 mg via ORAL

## 2017-12-24 MED ORDER — DEXAMETHASONE SODIUM PHOSPHATE 10 MG/ML IJ SOLN
INTRAMUSCULAR | Status: AC
  Filled 2017-12-24: qty 1

## 2017-12-24 MED ORDER — PALONOSETRON HCL INJECTION 0.25 MG/5ML
INTRAVENOUS | Status: AC
  Filled 2017-12-24: qty 5

## 2017-12-24 MED ORDER — LEUCOVORIN CALCIUM INJECTION 350 MG
200.0000 mg/m2 | Freq: Once | INTRAVENOUS | Status: AC
  Administered 2017-12-24: 460 mg via INTRAVENOUS
  Filled 2017-12-24: qty 23

## 2017-12-24 MED ORDER — DEXTROSE 5 % IV SOLN
Freq: Once | INTRAVENOUS | Status: AC
  Administered 2017-12-24: 08:00:00 via INTRAVENOUS

## 2017-12-24 MED ORDER — OXALIPLATIN CHEMO INJECTION 100 MG/20ML
65.0000 mg/m2 | Freq: Once | INTRAVENOUS | Status: AC
  Administered 2017-12-24: 150 mg via INTRAVENOUS
  Filled 2017-12-24: qty 10

## 2017-12-24 MED ORDER — DIPHENHYDRAMINE HCL 25 MG PO CAPS
ORAL_CAPSULE | ORAL | Status: AC
  Filled 2017-12-24: qty 1

## 2017-12-24 MED ORDER — PALONOSETRON HCL INJECTION 0.25 MG/5ML
0.2500 mg | Freq: Once | INTRAVENOUS | Status: AC
Start: 2017-12-24 — End: 2017-12-24
  Administered 2017-12-24: 0.25 mg via INTRAVENOUS

## 2017-12-24 MED ORDER — DEXAMETHASONE SODIUM PHOSPHATE 10 MG/ML IJ SOLN
10.0000 mg | Freq: Once | INTRAMUSCULAR | Status: AC
Start: 2017-12-24 — End: 2017-12-24
  Administered 2017-12-24: 10 mg via INTRAVENOUS

## 2017-12-24 MED ORDER — SODIUM CHLORIDE 0.9 % IV SOLN
1800.0000 mg/m2 | INTRAVENOUS | Status: DC
  Administered 2017-12-24: 4150 mg via INTRAVENOUS
  Filled 2017-12-24: qty 83

## 2017-12-24 NOTE — Patient Instructions (Signed)
[facility] Discharge Instructions for Patients Receiving Chemotherapy  Today you received the following chemotherapy agents: Oxaliplatin (Eloxatin), Leucovorin, Fluorouracil (Adrucil, 5-FU)  To help prevent nausea and vomiting after your treatment, we encourage you to take your nausea medication as prescribed. Received Aloxi during treatment today-->Take Compazine (not Zofran) for the next 3 days as needed.   If you develop nausea and vomiting that is not controlled by your nausea medication, call the clinic.   BELOW ARE SYMPTOMS THAT SHOULD BE REPORTED IMMEDIATELY:  *FEVER GREATER THAN 100.5 F  *CHILLS WITH OR WITHOUT FEVER  NAUSEA AND VOMITING THAT IS NOT CONTROLLED WITH YOUR NAUSEA MEDICATION  *UNUSUAL SHORTNESS OF BREATH  *UNUSUAL BRUISING OR BLEEDING  TENDERNESS IN MOUTH AND THROAT WITH OR WITHOUT PRESENCE OF ULCERS  *URINARY PROBLEMS  *BOWEL PROBLEMS  UNUSUAL RASH Items with * indicate a potential emergency and should be followed up as soon as possible.  Feel free to call the clinic should you have any questions or concerns. The clinic phone number is [phone].  Please show the [place] at check-in to the Emergency Department and triage nurse.

## 2017-12-24 NOTE — Progress Notes (Signed)
Noticed a diffuse rash on pt's trunk and forearms before connecting pt to 5-FU pump. Alfredia Client notified who came and assessed pt in infusion. Pt denied any CP, SOB, or other symptoms. Received order for 25 mg PO Benadryl. Medication administered. Per Lucianne Lei per Dr. Benay Spice, ok for pt to be connected to 5-FU pump and sent home. Told pt to continue to monitor rash, and should it still be there tomorrow, call the clinic number. Pt verbalized understanding and agreement. In basket sent to Dr. Gearldine Shown desk nurse to also call and check on pt tomorrow.

## 2017-12-25 NOTE — Progress Notes (Signed)
[facility]pt[facility] Clinic Progress Note   Nicholas Suarez [id number] [birth year] 67 y.o.  [name] is managed by Dr. Dominica Severin B. Sherrill  Actively treated with chemotherapy/immunotherapy: yes  Current Therapy: FOLFOX  Last Treated: 12/24/2017 (cycle 2)  Assessment: Plan:    Drug rash   Drug rash secondary to oxaliplatin: This was discussed with Dr. Dominica Severin B. Sherrill.  The patient was given Benadryl 25 mg p.o. x1.  His pruritic rash markedly improved with his pruritus resolving.  Dr. Dominica Severin B. Benay Spice has noted that the patient likely has a reaction to oxaliplatin.  Please see After Visit Summary for patient specific instructions.  Future Appointments  Date Time Provider [place]  12/26/2017  1:00 PM [place] [facility] None  01/07/2018  7:45 AM [facility] LAB 5 [facility] None  01/07/2018  8:00 AM [facility] [place] None  01/07/2018  8:45 AM [facility] INFUSION [facility] None  01/09/2018  2:15 PM [facility] [place] [facility] None  01/21/2018 10:00 AM [facility] LAB 2 [facility] None  01/21/2018 10:15 AM [facility] [place] [facility] None  01/21/2018 10:45 AM [name], NP [facility] None  01/21/2018 12:00 PM [facility] INFUSION [facility] None  01/23/2018  2:15 PM [facility] [place] FLUSH [facility] None  02/04/2018  8:45 AM [facility] LAB 3 [facility] None  02/04/2018  9:00 AM [facility] [place] FLUSH [facility] None  02/04/2018  9:30 AM Ladell Pier, MD [facility] None  02/04/2018 10:15 AM [facility] INFUSION [facility] None  02/06/2018  2:45 PM [facility] [place] FLUSH [facility] None    No orders of the defined types were placed in this encounter.      Subjective:   Patient ID:  Nicholas Suarez is a 67 y.o. (DOB 05-24-1951) male.  Chief Complaint: No chief complaint on file.   HPI Nicholas Suarez is a 67 year old male with a history of a metastatic rectal cancer who is managed by Dr. Dominica Severin B. Sherrill.  The patient was seen in the  infusion room today after he had been premedicated with Aloxi and dexamethasone.  He had also received leucovorin and oxaliplatin when he developed a diffuse erythematous rash over his bilateral forearms and abdomen.  This began during his infusion.  He denies any shortness of breath, chest tightness, oral irritation, or difficulty swallowing.  He has previously been treated with FOLFOX.  This was restarted with the patient receiving cycle 2 today.  Medications: I have reviewed the patient's current medications.  Allergies:  Allergies  Allergen Reactions  . Oxaliplatin Rash    Past Medical History:  Diagnosis Date  . Allergic rhinitis, seasonal   . Anemia   . Bilateral hydrocele   . Bilateral hydronephrosis   . Chronic constipation   . Edema of right lower extremity   . Rectal cancer ([place]) dx 11/27/2016 via colonscopy w/ bx   oncolgoist-  dr Benay Spice--  rectal adenocarcinoma w/ metastatic retroperitoneal lymphadenopathy  . Wears glasses     Past Surgical History:  Procedure Laterality Date  . COLONOSCOPY WITH ESOPHAGOGASTRODUODENOSCOPY (EGD)  11-27-2016  dr Earlean Shawl   rectal mass  . CYSTOSCOPY WITH BIOPSY N/A 12/11/2016   Procedure: CYSTOSCOPY WITH BIOPSY;  Surgeon: Kathie Rhodes, MD;  Location[facility];  Service: Urology;  Laterality: N/A;  . IR FLUORO GUIDE PORT INSERTION RIGHT  12/05/2016  . IR FLUORO GUIDE PORT INSERTION RIGHT  08/14/2017  . IR REMOVAL TUN ACCESS W/ PORT W/O FL MOD SED  01/31/2017  . IR US GUIDE VASC ACCESS  RIGHT  12/05/2016  . IR US GUIDE VASC ACCESS RIGHT  08/14/2017  . TEE WITHOUT CARDIOVERSION N/A 01/30/2017   Procedure: TRANSESOPHAGEAL ECHOCARDIOGRAM (TEE);  Surgeon: Acie Fredrickson Wonda Cheng, MD;  Location: [facility] ENDOSCOPY;  Service: Cardiovascular;  Laterality: N/A;  . UMBILICAL HERNIA REPAIR  05/2016    Family History  Problem Relation Age of Onset  . Breast cancer Mother   . Alcohol abuse Father   . Heart disease Other   . Cancer Other         Breast Cancer  . Arthritis Other   . Alcohol abuse Other   . Drug abuse Other   . Stroke Neg Hx   . Kidney disease Neg Hx   . Hypertension Neg Hx   . Hyperlipidemia Neg Hx   . Early death Neg Hx   . Colon cancer Neg Hx   . Esophageal cancer Neg Hx   . Rectal cancer Neg Hx   . Stomach cancer Neg Hx     Social History   Socioeconomic History  . Marital status: Married    Spouse name: Not on file  . Number of children: Not on file  . Years of education: Not on file  . Highest education level: Not on file  Occupational History  . Not on file  Social Needs  . Financial resource strain: Not on file  . Food insecurity:    Worry: Not on file    Inability: Not on file  . Transportation needs:    Medical: Not on file    Non-medical: Not on file  Tobacco Use  . Smoking status: Former Smoker    Packs/day: 1.00    Years: 10.00    Pack years: 10.00    Types: Cigarettes, Pipe, Cigars    Last attempt to quit: 06/12/1988    Years since quitting: 29.5  . Smokeless tobacco: Former Systems developer    Types: [address] date: 06/12/1993  Substance and Sexual Activity  . Alcohol use: No  . Drug use: No  . Sexual activity: Yes  Lifestyle  . Physical activity:    Days per week: Not on file    Minutes per session: Not on file  . Stress: Not on file  Relationships  . Social connections:    Talks on phone: Not on file    Gets together: Not on file    Attends religious service: Not on file    Active member of club or organization: Not on file    Attends meetings of clubs or organizations: Not on file    Relationship status: Not on file  . Intimate partner violence:    Fear of current or ex partner: Not on file    Emotionally abused: Not on file    Physically abused: Not on file    Forced sexual activity: Not on file  Other Topics Concern  . Not on file  Social History Narrative  . Not on file    Past Medical History, Surgical history, Social history, and Family history were reviewed and  updated as appropriate.   Please see review of systems for further details on the patient's review from today.   Review of Systems:  Review of Systems  Constitutional: Negative for chills, diaphoresis and fever.  HENT: Negative for facial swelling and trouble swallowing.   Respiratory: Negative for cough, chest tightness and shortness of breath.   Cardiovascular: Negative for chest pain.  Skin: Positive for rash.    Objective:  Physical Exam:  There were no vitals taken for this visit. ECOG: 0  Physical Exam  Constitutional: No distress.  HENT:  Head: Normocephalic and atraumatic.  Mouth/Throat: Oropharynx is clear and moist.  Cardiovascular: Normal rate, regular rhythm, normal heart sounds and intact distal pulses. Exam reveals no gallop and no friction rub.  No murmur heard. Pulmonary/Chest: Effort normal and breath sounds normal. No stridor. No respiratory distress. He has no wheezes. He has no rales.  Musculoskeletal: He exhibits no edema or deformity.  Neurological: He is alert. Coordination normal.  Skin: Skin is warm and dry. Rash noted. He is not diaphoretic. There is erythema.         Lab Review:     Component Value Date/Time   NA 139 12/21/2017 1011   NA 140 06/06/2017 0813   K 4.4 12/21/2017 1011   K 4.5 06/06/2017 0813   CL 108 12/21/2017 1011   CO2 26 12/21/2017 1011   CO2 25 06/06/2017 0813   GLUCOSE 121 (H) 12/21/2017 1011   GLUCOSE 101 06/06/2017 0813   BUN 16 12/21/2017 1011   BUN 15.0 06/06/2017 0813   CREATININE 0.91 12/21/2017 1011   CREATININE 1.1 06/06/2017 0813   CALCIUM 10.1 12/21/2017 1011   CALCIUM 10.3 06/06/2017 0813   PROT 6.4 (L) 12/21/2017 1011   PROT 6.5 06/06/2017 0813   ALBUMIN 3.4 (L) 12/21/2017 1011   ALBUMIN 3.7 06/06/2017 0813   AST 18 12/21/2017 1011   AST 26 06/06/2017 0813   ALT 12 12/21/2017 1011   ALT 21 06/06/2017 0813   ALKPHOS 110 12/21/2017 1011   ALKPHOS 95 06/06/2017 0813   BILITOT 0.3 12/21/2017 1011    BILITOT 0.60 06/06/2017 0813   GFRNONAA >60 12/21/2017 1011   GFRAA >60 12/21/2017 1011       Component Value Date/Time   WBC 4.4 12/21/2017 1011   WBC 5.5 12/10/2017 1907   RBC 3.91 (L) 12/21/2017 1011   HGB 12.0 (L) 12/21/2017 1011   HGB 13.6 06/06/2017 0813   HCT 36.4 (L) 12/21/2017 1011   HCT 40.4 06/06/2017 0813   PLT 161 12/21/2017 1011   PLT 117 (L) 06/06/2017 0813   MCV 93.1 12/21/2017 1011   MCV 98.3 (H) 06/06/2017 0813   MCH 30.8 12/21/2017 1011   MCHC 33.1 12/21/2017 1011   RDW 14.0 12/21/2017 1011   RDW 14.8 (H) 06/06/2017 0813   LYMPHSABS 0.6 (L) 12/21/2017 1011   LYMPHSABS 1.0 06/06/2017 0813   MONOABS 0.3 12/21/2017 1011   MONOABS 0.4 06/06/2017 0813   EOSABS 0.3 12/21/2017 1011   EOSABS 0.4 06/06/2017 0813   BASOSABS 0.0 12/21/2017 1011   BASOSABS 0.0 06/06/2017 0813   -------------------------------  Imaging from last 24 hours (if applicable):  Radiology interpretation: Ct Abdomen Pelvis W Contrast  Result Date: 11/26/2017 CLINICAL DATA:  RIGHT groin swelling, abdominal pain and dysuria. History of hydronephrosis, hydroceles, umbilical hernia repair, rectal cancer. EXAM: CT ABDOMEN AND PELVIS WITH CONTRAST TECHNIQUE: Multidetector CT imaging of the abdomen and pelvis was performed using the standard protocol following bolus administration of intravenous contrast. CONTRAST:  146mL ISOVUE-300 IOPAMIDOL (ISOVUE-300) INJECTION 61% COMPARISON:  CT abdomen and pelvis Nov 09, 2017 FINDINGS: LOWER CHEST: Lung bases are clear. Included heart size is normal. No pericardial effusion. HEPATOBILIARY: Liver and gallbladder are normal. PANCREAS: Normal. SPLEEN: Normal. ADRENALS/URINARY TRACT: Kidneys are orthotopic, demonstrating symmetric enhancement, slightly delayed nephrogram. Mild bilateral pelvicaliectasis. No nephrolithiasis, or solid renal masses. 2 cm homogeneously hypodense benign-appearing cyst LEFT interpolar kidney.  Delayed contrast excretion bilateral kidneys.  Urinary bladder is partially distended with abnormal circumferential wall thickening. Normal adrenal glands. STOMACH/BOWEL: Similar appearance of circumferential small rectal mass. Moderate amount of retained large bowel stool. Small large bowel normal course and caliber. Normal appendix. VASCULAR/LYMPHATIC: Worsening retroperitoneal lymphadenopathy, dominant nodal conglomeration was 4.3 x 4.5 cm, now 5.2 x 5.3 cm. Worsening retroperitoneal fat stranding. 2.2 cm lymph node anterior to the spleen was 1.9 cm. Mild mesenteric lymphadenopathy. Anterior displacement of the aorta due to lymphadenopathy, moderate calcific atherosclerosis. REPRODUCTIVE: Partially imaged large hydroceles. Edematous glans penis. Prostate size is normal. OTHER: Increased small volume ascites. No intraperitoneal free air or focal fluid collection. Similar presacral fat stranding. MUSCULOSKELETAL: Intrapelvic wall fat stranding, possible postradiation change. Similarly edematous ileo psoas muscles. Mild sclerosis L3, potential metastasis. Moderate to severe L5-S1 degenerative disc resulting in severe RIGHT L5-S1 neural foraminal narrowing. IMPRESSION: 1. Increased small volume ascites with interval worsening of nodal metastatic disease. Similar rectal mass. 2. Mild bilateral pelvicaliectasis, inferred decreased renal function. 3. Large hydrocele. 4. Abnormally thickened bladder wall seen with cystitis though tumor not excluded. Aortic Atherosclerosis (ICD10-I70.0). Electronically Signed   By: Elon Alas M.D.   On: 11/26/2017 03:58   Dg Abdomen Acute W/chest  Result Date: 12/10/2017 CLINICAL DATA:  Abdominal pain for 5 days with constipation. EXAM: DG ABDOMEN ACUTE W/ 1V CHEST COMPARISON:  11/26/2017 abdominal/pelvic CT and priors FINDINGS: There is no evidence of dilated bowel loops or free intraperitoneal air. No radiopaque calculi or other significant radiographic abnormality is seen. Heart size and mediastinal contours are within  normal limits. Both lungs are clear. Small to moderate amount of colonic stool noted. IMPRESSION: Small to moderate amount of colonic stool without other significant abnormality. Electronically Signed   By: Margarette Canada M.D.   On: 12/10/2017 19:40        This case was discussed with Dr. Benay Spice. He expressed agreement with my management of this patient.

## 2017-12-26 ENCOUNTER — Inpatient Hospital Stay: Payer: Medicare Other

## 2017-12-26 VITALS — BP 128/69 | HR 75 | Temp 97.9°F | Resp 17

## 2017-12-26 DIAGNOSIS — Z5111 Encounter for antineoplastic chemotherapy: Secondary | ICD-10-CM | POA: Diagnosis not present

## 2017-12-26 DIAGNOSIS — C20 Malignant neoplasm of rectum: Secondary | ICD-10-CM

## 2017-12-26 MED ORDER — HEPARIN SOD (PORK) LOCK FLUSH 100 UNIT/ML IV SOLN
500.0000 [IU] | Freq: Once | INTRAVENOUS | Status: AC | PRN
  Administered 2017-12-26: 500 [IU]
  Filled 2017-12-26: qty 5

## 2017-12-26 MED ORDER — SODIUM CHLORIDE 0.9% FLUSH
10.0000 mL | INTRAVENOUS | Status: DC | PRN
  Administered 2017-12-26: 10 mL
  Filled 2017-12-26: qty 10

## 2017-12-26 NOTE — Patient Instructions (Signed)

## 2017-12-27 ENCOUNTER — Telehealth: Payer: Self-pay

## 2017-12-27 NOTE — Telephone Encounter (Addendum)
LVM for pt to return call to clinic   ----- Message from Zola Button, RN sent at 12/24/2017 11:43 AM EDT ----- Regarding: Check on Rash If you don't mind calling pt on Tuesday 12/25/17 to check on if his rash has resolved. Thank you!  Katelin :)

## 2018-01-02 ENCOUNTER — Telehealth: Payer: Self-pay | Admitting: Oncology

## 2018-01-02 NOTE — Telephone Encounter (Signed)
Infusion appointment changed due to scheduling issue with follow up visit prior to infusion/  Patient is ok with moving / splitting this appointment per 7/12 staff message

## 2018-01-06 ENCOUNTER — Other Ambulatory Visit: Payer: Self-pay | Admitting: Oncology

## 2018-01-07 ENCOUNTER — Ambulatory Visit: Payer: Medicare Other

## 2018-01-07 ENCOUNTER — Other Ambulatory Visit: Payer: Self-pay | Admitting: Oncology

## 2018-01-07 ENCOUNTER — Inpatient Hospital Stay (HOSPITAL_BASED_OUTPATIENT_CLINIC_OR_DEPARTMENT_OTHER): Payer: Medicare Other | Admitting: Oncology

## 2018-01-07 ENCOUNTER — Inpatient Hospital Stay: Payer: Medicare Other

## 2018-01-07 VITALS — BP 121/81 | HR 65 | Temp 98.0°F | Resp 17 | Ht 75.0 in | Wt 201.9 lb

## 2018-01-07 DIAGNOSIS — C674 Malignant neoplasm of posterior wall of bladder: Secondary | ICD-10-CM

## 2018-01-07 DIAGNOSIS — C20 Malignant neoplasm of rectum: Secondary | ICD-10-CM

## 2018-01-07 DIAGNOSIS — C7989 Secondary malignant neoplasm of other specified sites: Secondary | ICD-10-CM | POA: Diagnosis not present

## 2018-01-07 DIAGNOSIS — Z5111 Encounter for antineoplastic chemotherapy: Secondary | ICD-10-CM | POA: Diagnosis not present

## 2018-01-07 DIAGNOSIS — Z95828 Presence of other vascular implants and grafts: Secondary | ICD-10-CM

## 2018-01-07 DIAGNOSIS — R911 Solitary pulmonary nodule: Secondary | ICD-10-CM

## 2018-01-07 DIAGNOSIS — R609 Edema, unspecified: Secondary | ICD-10-CM

## 2018-01-07 DIAGNOSIS — C786 Secondary malignant neoplasm of retroperitoneum and peritoneum: Secondary | ICD-10-CM

## 2018-01-07 LAB — CMP (CANCER CENTER ONLY)
ALK PHOS: 95 U/L (ref 38–126)
ALT: 17 U/L (ref 0–44)
AST: 26 U/L (ref 15–41)
Albumin: 3.5 g/dL (ref 3.5–5.0)
Anion gap: 6 (ref 5–15)
BUN: 13 mg/dL (ref 8–23)
CALCIUM: 10.1 mg/dL (ref 8.9–10.3)
CHLORIDE: 108 mmol/L (ref 98–111)
CO2: 26 mmol/L (ref 22–32)
CREATININE: 0.83 mg/dL (ref 0.61–1.24)
GFR, Est AFR Am: >60 mL/min (ref >60)
Glucose, Bld: 95 mg/dL (ref 70–99)
Potassium: 4.3 mmol/L (ref 3.5–5.1)
Sodium: 140 mmol/L (ref 135–145)
Total Bilirubin: 0.4 mg/dL (ref 0.3–1.2)
Total Protein: 6.5 g/dL (ref 6.5–8.1)

## 2018-01-07 LAB — CBC WITH DIFFERENTIAL (CANCER CENTER ONLY)
BASOS PCT: 1 %
Basophils Absolute: 0.1 10*3/uL (ref 0.0–0.1)
Eosinophils Absolute: 0.3 10*3/uL (ref 0.0–0.5)
Eosinophils Relative: 8 %
HEMATOCRIT: 36.9 % — AB (ref 38.4–49.9)
Hemoglobin: 12.5 g/dL — ABNORMAL LOW (ref 13.0–17.1)
Lymphocytes Relative: 23 %
Lymphs Abs: 0.9 10*3/uL (ref 0.9–3.3)
MCH: 30.8 pg (ref 27.2–33.4)
MCHC: 33.9 g/dL (ref 32.0–36.0)
MCV: 90.9 fL (ref 79.3–98.0)
MONO ABS: 0.4 10*3/uL (ref 0.1–0.9)
MONOS PCT: 10 %
NEUTROS ABS: 2.3 10*3/uL (ref 1.5–6.5)
Neutrophils Relative %: 58 %
Platelet Count: 100 10*3/uL — ABNORMAL LOW (ref 140–400)
RBC: 4.06 MIL/uL — ABNORMAL LOW (ref 4.20–5.82)
RDW: 14 % (ref 11.0–14.6)
WBC Count: 3.9 10*3/uL — ABNORMAL LOW (ref 4.0–10.3)

## 2018-01-07 LAB — CEA (IN HOUSE-CHCC): CEA (CHCC-IN HOUSE): 649.7 ng/mL — AB (ref 0.00–5.00)

## 2018-01-07 MED ORDER — SODIUM CHLORIDE 0.9% FLUSH
10.0000 mL | Freq: Once | INTRAVENOUS | Status: AC
  Administered 2018-01-07: 10 mL
  Filled 2018-01-07: qty 10

## 2018-01-07 MED ORDER — HEPARIN SOD (PORK) LOCK FLUSH 100 UNIT/ML IV SOLN
250.0000 [IU] | Freq: Once | INTRAVENOUS | Status: AC
  Administered 2018-01-07: 500 [IU]
  Filled 2018-01-07: qty 5

## 2018-01-07 NOTE — Progress Notes (Signed)
[facility] OFFICE PROGRESS NOTE   Diagnosis: Rectal cancer  INTERVAL HISTORY:   [facility] Suarez returns for a scheduled visit.  He completed another cycle of FOLFOX on 12/24/2017.  He developed a rash following the oxaliplatin infusion.  The rash resolved after taking Benadryl.  He reports having flulike symptoms on the evening of chemotherapy and the following day.  He had "aching" and chills.  No nausea/vomiting, mouth sores, diarrhea, cold sensitivity, or neuropathy symptoms.  Leg edema has improved.  He is having bowel movements.  Objective:  Vital signs in last 24 hours:  Blood pressure 121/81, pulse 65, temperature 98 F (36.7 C), temperature source Oral, resp. rate 17, height 6' 3" (1.905 m), weight 201 lb 14.4 oz (91.6 kg), SpO2 100 %.    HEENT: No thrush or ulcers Lymphatics: Small left low cervical/scalene nodes Resp: Lungs clear bilaterally Cardio: Regular rate and rhythm GI: No hepatomegaly, nontender Vascular: Trace edema at the right lower leg and ankle  Skin: Palms without erythema, 2-3 cm soft cutaneous lesion at the right upper anterior chest, slightly raised erythematous lesion at the left upper anterior chest and left lower neck  Portacath/PICC-without erythema  Lab Results:  Lab Results  Component Value Date   WBC 3.9 (L) 01/07/2018   HGB 12.5 (L) 01/07/2018   HCT 36.9 (L) 01/07/2018   MCV 90.9 01/07/2018   PLT 100 (L) 01/07/2018   NEUTROABS 2.3 01/07/2018    CMP  Lab Results  Component Value Date   NA 140 01/07/2018   K 4.3 01/07/2018   CL 108 01/07/2018   CO2 26 01/07/2018   GLUCOSE 95 01/07/2018   BUN 13 01/07/2018   CREATININE 0.83 01/07/2018   CALCIUM 10.1 01/07/2018   PROT 6.5 01/07/2018   ALBUMIN 3.5 01/07/2018   AST 26 01/07/2018   ALT 17 01/07/2018   ALKPHOS 95 01/07/2018   BILITOT 0.4 01/07/2018   GFRNONAA >60 01/07/2018   GFRAA >60 01/07/2018    Lab Results  Component Value Date   CEA1 2,039.08 (H) 12/06/2017      Medications: I have reviewed the patient's current medications.   Assessment/Plan: 1. Rectal cancer  MRI lumbar spine 10/20/2016 with bilateral hydronephrosis and retroperitoneal adenopathy  CT abdomen/pelvis 10/20/2016 with extensive retroperitoneal process with marked interstitial thickening and lymphadenopathy; bilateral hydronephrosis; similar ill-defined soft tissue density and skin thickening around the umbilicus; diffuse bladder wall thickening slightly asymmetric at the bladder dome but no obvious bladder mass  Biopsy para-aortic lymph node 11/10/2016-metastatic adenocarcinoma consistent with GI primary  11/23/2016 CEA elevated, CA-19-9 normal  Colonoscopy 11/27/2016-fungating infiltrative nonobstructing mass in the distal rectum involving the anal verge. The rectum was fixed and frozen in the pelvis precluding passage of scope beyond the distal sigmoid colon. 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  Foundation 1-microsatellite stable; tumor mutational burden 5; NOTCH3amplification; NOTCH3rearrangement exon 14; [id number]  Upper endoscopy 11/27/2016-normal.  PET scan 06/22/2018showed minimal activity in the primary rectal carcinoma, diffuse wall thickening; mild activity associate with retroperitoneal lymph nodes; persistent haziness within the retroperitoneum. No evidence of liver metastasis or distant metastasis.  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  Cycle 2 FOLFOX 12/20/2016  Cycle 3 FOLFOX 01/03/2017 (oxaliplatin dose reduced secondary to thrombocytopenia)   Cycle 4 FOLFOX 01/17/2017 (oxaliplatin held due to thrombocytopenia)  Cycle 5 FOLFOX 02/15/2017  CTs 02/26/2017-new sclerotic lesions in the  spine and right iliac, no residual adenopathy,  persistent rectal wall thickening  Cycle 6 FOLFOX 03/01/2017 (Neulasta added, oxaliplatin further dose reduced)   Cycle 7 FOLFOX 03/15/2017 (oxaliplatin held secondary to thrombocytopenia)  Cycle 8 FOLFOX 03/29/2017  Cycle 9 FOLFOX 04/12/2017 (oxaliplatin held secondary to thrombocytopenia and neuropathy)  Cycle 10 FOLFOX 04/25/2017 (oxaliplatin held secondary to neuropathy)  CTs 05/07/2017-stable rectal thickening, stable periaortic soft tissue, stable sclerotic lesions at the lumbar spine and pelvis  Treatment changed to maintenance capecitabine beginning 05/14/2017  CTs 08/03/2017-irregular annular wall thickening mid to lower rectum appears mildly increased; newly enlarged infiltrative retroperitoneal nodal metastasis; mild left supraclavicular adenopathy appears minimally increased; ill-defined umbilical soft tissue thickening stable; small scattered sclerotic bone lesions stable.  Cycle 1 FOLFIRI/Panitumumab 08/20/2017  Cycle 2 FOLFIRI/Panitumumab 09/10/2017 (irinotecan and 5-FU dose reduced due to mucositis)  Cycle 3 FOLFIRI/panitumumab 09/24/2017  Cycle 4 FOLFIRI/panitumumab 10/08/2017 (5-fluorouracil further dose reduced secondary to mucositis)  Cycle 5 FOLFIRI/Panitumumab 10/29/2017  CTs 11/09/2017-increase in mediastinal, retroperitoneal, and mesenteric adenopathy, increased wall thickening of the rectum, increased sclerotic lesions at the thoracic spine  Cycle 1 FOLFOX 12/06/2017  Cycle 2 FOLFOX 12/24/2017  Cycle 3 FOLFOX 01/08/2018 2. Retroperitoneal lymphadenopathy secondary to #1 3. Bilateral hydronephrosis status post evaluation by urology 4. Asymmetry of the bladder dome on CT 10/20/2016 status post cystoscopy with biopsy planned 12/11/2016 (Dr. Ottelin)biopsy of the posterior bladder wall confirmed metastatic carcinoma consistent with metastatic signet ring cell carcinoma 5. Change in bowel habits, secondary to #1-improved. 6. Right leg edema, question due to lymphatic  obstruction;venous Doppler negative for DVT 11/23/2016. 7. Port-A-Cath placement 12/05/2016 8. Thrombocytopenia secondary to chemotherapy; oxaliplatin dose reduced with cycle 3 FOLFOX. Progressive thrombocytopenia 01/17/2017; oxaliplatin held 01/17/2017. 9. Right lower extremity cellulitis 01/27/2017-blood culture positive for methicillin sensitive staph aureus; Port-A-Cath removed. PICC line placed. TEE 01/30/2017 without evidence of vegetation. 14 days of Ancef recommended after Port-A-Cath removal with end date 02/14/2017. 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 Mucositis following cycle 1 FOLFIRI/Panitumumab-cycle 2 delayed andirinotecan/5-FU dose reduced.Improved   Disposition: Nicholas Suarez has completed 2 cycles of salvage therapy with FOLFOX.  The left neck adenopathy and cutaneous lesions appear softer.  He has tolerated the chemotherapy well, though he developed a rash during the last cycle of chemotherapy.  He also reports "flu" like symptoms on the evening following chemotherapy.  This may be a manifestation of an oxaliplatin allergy.  He will be premedicated with Benadryl and Pepcid with cycle 3 FOLFOX.  We will follow-up on the CEA from today.  Nicholas Suarez will return for an office visit and the next cycle of chemotherapy in 2 weeks.  He will contact us for symptoms of an allergic reaction following this cycle of chemotherapy.  25 minutes were spent with the patient today.  The majority of the time was used for counseling and coordination of care.  Gary Sherrill, MD  01/07/2018  8:55 AM   

## 2018-01-08 ENCOUNTER — Inpatient Hospital Stay: Payer: Medicare Other

## 2018-01-08 VITALS — BP 118/87 | HR 71 | Temp 97.8°F | Resp 14

## 2018-01-08 DIAGNOSIS — Z5111 Encounter for antineoplastic chemotherapy: Secondary | ICD-10-CM | POA: Diagnosis not present

## 2018-01-08 DIAGNOSIS — C20 Malignant neoplasm of rectum: Secondary | ICD-10-CM

## 2018-01-08 MED ORDER — OXALIPLATIN CHEMO INJECTION 100 MG/20ML
65.0000 mg/m2 | Freq: Once | INTRAVENOUS | Status: AC
  Administered 2018-01-08: 150 mg via INTRAVENOUS
  Filled 2018-01-08: qty 20

## 2018-01-08 MED ORDER — DEXTROSE 5 % IV SOLN
INTRAVENOUS | Status: DC
  Administered 2018-01-08: 10:00:00 via INTRAVENOUS
  Filled 2018-01-08: qty 250

## 2018-01-08 MED ORDER — FLUOROURACIL CHEMO INJECTION 5 GM/100ML
1800.0000 mg/m2 | INTRAVENOUS | Status: DC
  Administered 2018-01-08: 4150 mg via INTRAVENOUS
  Filled 2018-01-08: qty 83

## 2018-01-08 MED ORDER — LEUCOVORIN CALCIUM INJECTION 350 MG
200.0000 mg/m2 | Freq: Once | INTRAVENOUS | Status: AC
  Administered 2018-01-08: 460 mg via INTRAVENOUS
  Filled 2018-01-08: qty 23

## 2018-01-08 MED ORDER — DIPHENHYDRAMINE HCL 50 MG/ML IJ SOLN
25.0000 mg | Freq: Once | INTRAMUSCULAR | Status: AC
  Administered 2018-01-08: 25 mg via INTRAVENOUS

## 2018-01-08 MED ORDER — DEXAMETHASONE SODIUM PHOSPHATE 10 MG/ML IJ SOLN
INTRAMUSCULAR | Status: AC
  Filled 2018-01-08: qty 1

## 2018-01-08 MED ORDER — PALONOSETRON HCL INJECTION 0.25 MG/5ML
INTRAVENOUS | Status: AC
Start: 2018-01-08 — End: ?
  Filled 2018-01-08: qty 5

## 2018-01-08 MED ORDER — DIPHENHYDRAMINE HCL 50 MG/ML IJ SOLN
INTRAMUSCULAR | Status: AC
  Filled 2018-01-08: qty 1

## 2018-01-08 MED ORDER — FAMOTIDINE IN NACL 20-0.9 MG/50ML-% IV SOLN
INTRAVENOUS | Status: AC
Start: 2018-01-08 — End: ?
  Filled 2018-01-08: qty 50

## 2018-01-08 MED ORDER — DEXAMETHASONE SODIUM PHOSPHATE 10 MG/ML IJ SOLN
10.0000 mg | Freq: Once | INTRAMUSCULAR | Status: AC
  Administered 2018-01-08: 10 mg via INTRAVENOUS

## 2018-01-08 MED ORDER — DEXTROSE 5 % IV SOLN
Freq: Once | INTRAVENOUS | Status: AC
  Administered 2018-01-08: 09:00:00 via INTRAVENOUS
  Filled 2018-01-08: qty 250

## 2018-01-08 MED ORDER — PALONOSETRON HCL INJECTION 0.25 MG/5ML
0.2500 mg | Freq: Once | INTRAVENOUS | Status: AC
  Administered 2018-01-08: 0.25 mg via INTRAVENOUS

## 2018-01-08 MED ORDER — PALONOSETRON HCL INJECTION 0.25 MG/5ML
INTRAVENOUS | Status: AC
  Filled 2018-01-08: qty 5

## 2018-01-08 MED ORDER — FAMOTIDINE IN NACL 20-0.9 MG/50ML-% IV SOLN
20.0000 mg | Freq: Once | INTRAVENOUS | Status: AC
  Administered 2018-01-08: 20 mg via INTRAVENOUS

## 2018-01-08 NOTE — Patient Instructions (Signed)
[facility] Discharge Instructions for Patients Receiving Chemotherapy  Today you received the following chemotherapy agents: Oxaliplatin (Eloxatin), Leucovorin, Fluorouracil (Adrucil, 5-FU)  To help prevent nausea and vomiting after your treatment, we encourage you to take your nausea medication as prescribed. Received Aloxi during treatment today-->Take Compazine (not Zofran) for the next 3 days as needed.   If you develop nausea and vomiting that is not controlled by your nausea medication, call the clinic.   BELOW ARE SYMPTOMS THAT SHOULD BE REPORTED IMMEDIATELY:  *FEVER GREATER THAN 100.5 F  *CHILLS WITH OR WITHOUT FEVER  NAUSEA AND VOMITING THAT IS NOT CONTROLLED WITH YOUR NAUSEA MEDICATION  *UNUSUAL SHORTNESS OF BREATH  *UNUSUAL BRUISING OR BLEEDING  TENDERNESS IN MOUTH AND THROAT WITH OR WITHOUT PRESENCE OF ULCERS  *URINARY PROBLEMS  *BOWEL PROBLEMS  UNUSUAL RASH Items with * indicate a potential emergency and should be followed up as soon as possible.  Feel free to call the clinic should you have any questions or concerns. The clinic phone number is [phone].  Please show the [facility] at check-in to the Emergency Department and triage nurse.

## 2018-01-10 ENCOUNTER — Inpatient Hospital Stay: Payer: Medicare Other | Attending: Oncology

## 2018-01-10 VITALS — BP 129/83 | HR 74 | Temp 97.9°F | Resp 16

## 2018-01-10 DIAGNOSIS — Z5111 Encounter for antineoplastic chemotherapy: Secondary | ICD-10-CM | POA: Insufficient documentation

## 2018-01-10 DIAGNOSIS — D696 Thrombocytopenia, unspecified: Secondary | ICD-10-CM | POA: Diagnosis not present

## 2018-01-10 DIAGNOSIS — C20 Malignant neoplasm of rectum: Secondary | ICD-10-CM | POA: Insufficient documentation

## 2018-01-10 DIAGNOSIS — R52 Pain, unspecified: Secondary | ICD-10-CM | POA: Diagnosis not present

## 2018-01-10 DIAGNOSIS — R6883 Chills (without fever): Secondary | ICD-10-CM | POA: Insufficient documentation

## 2018-01-10 DIAGNOSIS — Z452 Encounter for adjustment and management of vascular access device: Secondary | ICD-10-CM | POA: Diagnosis not present

## 2018-01-10 MED ORDER — HEPARIN SOD (PORK) LOCK FLUSH 100 UNIT/ML IV SOLN
500.0000 [IU] | Freq: Once | INTRAVENOUS | Status: AC | PRN
  Administered 2018-01-10: 500 [IU]
  Filled 2018-01-10: qty 5

## 2018-01-10 MED ORDER — SODIUM CHLORIDE 0.9% FLUSH
10.0000 mL | INTRAVENOUS | Status: DC | PRN
  Administered 2018-01-10: 10 mL
  Filled 2018-01-10: qty 10

## 2018-01-10 NOTE — Patient Instructions (Signed)
Implanted Port Home Guide An implanted port is a type of central line that is placed under the skin. Central lines are used to provide IV access when treatment or nutrition needs to be given through a person's veins. Implanted ports are used for long-term IV access. An implanted port may be placed because:  You need IV medicine that would be irritating to the small veins in your hands or arms.  You need long-term IV medicines, such as antibiotics.  You need IV nutrition for a long period.  You need frequent blood draws for lab tests.  You need dialysis.  Implanted ports are usually placed in the chest area, but they can also be placed in the upper arm, the abdomen, or the leg. An implanted port has two main parts:  Reservoir. The reservoir is round and will appear as a small, raised area under your skin. The reservoir is the part where a needle is inserted to give medicines or draw blood.  Catheter. The catheter is a thin, flexible tube that extends from the reservoir. The catheter is placed into a large vein. Medicine that is inserted into the reservoir goes into the catheter and then into the vein.  How will I care for my incision site? Do not get the incision site wet. Bathe or shower as directed by your health care provider. How is my port accessed? Special steps must be taken to access the port:  Before the port is accessed, a numbing cream can be placed on the skin. This helps numb the skin over the port site.  Your health care provider uses a sterile technique to access the port. ? Your health care provider must put on a mask and sterile gloves. ? The skin over your port is cleaned carefully with an antiseptic and allowed to dry. ? The port is gently pinched between sterile gloves, and a needle is inserted into the port.  Only "non-coring" port needles should be used to access the port. Once the port is accessed, a blood return should be checked. This helps ensure that the port  is in the vein and is not clogged.  If your port needs to remain accessed for a constant infusion, a clear (transparent) bandage will be placed over the needle site. The bandage and needle will need to be changed every week, or as directed by your health care provider.  Keep the bandage covering the needle clean and dry. Do not get it wet. Follow your health care provider's instructions on how to take a shower or bath while the port is accessed.  If your port does not need to stay accessed, no bandage is needed over the port.  What is flushing? Flushing helps keep the port from getting clogged. Follow your health care provider's instructions on how and when to flush the port. Ports are usually flushed with saline solution or a medicine called heparin. The need for flushing will depend on how the port is used.  If the port is used for intermittent medicines or blood draws, the port will need to be flushed: ? After medicines have been given. ? After blood has been drawn. ? As part of routine maintenance.  If a constant infusion is running, the port may not need to be flushed.  How long will my port stay implanted? The port can stay in for as long as your health care provider thinks it is needed. When it is time for the port to come out, surgery will be   done to remove it. The procedure is similar to the one performed when the port was put in. When should I seek immediate medical care? When you have an implanted port, you should seek immediate medical care if:  You notice a bad smell coming from the incision site.  You have swelling, redness, or drainage at the incision site.  You have more swelling or pain at the port site or the surrounding area.  You have a fever that is not controlled with medicine.  This information is not intended to replace advice given to you by your health care provider. Make sure you discuss any questions you have with your health care provider. Document  Released: 05/29/2005 Document Revised: 11/04/2015 Document Reviewed: 02/03/2013 Elsevier Interactive Patient Education  2017 Elsevier Inc.  

## 2018-01-10 NOTE — Progress Notes (Signed)
Pt stated wife has shingles, he has been exposed. Dr Benay Spice aware. Per Dr Benay Spice pt instructed to call if any skin changes, rashes or shingle like symptoms. Pt verbalized understanding.

## 2018-01-20 ENCOUNTER — Other Ambulatory Visit: Payer: Self-pay | Admitting: Oncology

## 2018-01-21 ENCOUNTER — Inpatient Hospital Stay: Payer: Medicare Other

## 2018-01-21 ENCOUNTER — Other Ambulatory Visit: Payer: Medicare Other

## 2018-01-21 ENCOUNTER — Inpatient Hospital Stay (HOSPITAL_BASED_OUTPATIENT_CLINIC_OR_DEPARTMENT_OTHER): Payer: Medicare Other | Admitting: Nurse Practitioner

## 2018-01-21 ENCOUNTER — Telehealth: Payer: Self-pay | Admitting: Nurse Practitioner

## 2018-01-21 ENCOUNTER — Ambulatory Visit: Payer: Medicare Other

## 2018-01-21 ENCOUNTER — Encounter: Payer: Self-pay | Admitting: Nurse Practitioner

## 2018-01-21 VITALS — BP 121/80 | HR 69 | Temp 97.6°F | Resp 18 | Ht 75.0 in | Wt 198.2 lb

## 2018-01-21 DIAGNOSIS — C20 Malignant neoplasm of rectum: Secondary | ICD-10-CM

## 2018-01-21 DIAGNOSIS — D696 Thrombocytopenia, unspecified: Secondary | ICD-10-CM

## 2018-01-21 DIAGNOSIS — R6883 Chills (without fever): Secondary | ICD-10-CM | POA: Diagnosis not present

## 2018-01-21 DIAGNOSIS — Z5111 Encounter for antineoplastic chemotherapy: Secondary | ICD-10-CM | POA: Diagnosis not present

## 2018-01-21 DIAGNOSIS — R52 Pain, unspecified: Secondary | ICD-10-CM

## 2018-01-21 DIAGNOSIS — Z95828 Presence of other vascular implants and grafts: Secondary | ICD-10-CM

## 2018-01-21 LAB — CMP (CANCER CENTER ONLY)
ALK PHOS: 95 U/L (ref 38–126)
ALT: 17 U/L (ref 0–44)
ANION GAP: 7 (ref 5–15)
AST: 27 U/L (ref 15–41)
Albumin: 3.3 g/dL — ABNORMAL LOW (ref 3.5–5.0)
BUN: 12 mg/dL (ref 8–23)
CALCIUM: 10.2 mg/dL (ref 8.9–10.3)
CHLORIDE: 107 mmol/L (ref 98–111)
CO2: 26 mmol/L (ref 22–32)
CREATININE: 0.88 mg/dL (ref 0.61–1.24)
GFR, Est AFR Am: >60 mL/min (ref >60)
GFR, Estimated: >60 mL/min (ref >60)
Glucose, Bld: 92 mg/dL (ref 70–99)
Potassium: 4.3 mmol/L (ref 3.5–5.1)
SODIUM: 140 mmol/L (ref 135–145)
Total Bilirubin: 0.6 mg/dL (ref 0.3–1.2)
Total Protein: 6.5 g/dL (ref 6.5–8.1)

## 2018-01-21 LAB — CBC WITH DIFFERENTIAL (CANCER CENTER ONLY)
Basophils Absolute: 0 10*3/uL (ref 0.0–0.1)
Basophils Relative: 1 %
EOS ABS: 0.3 10*3/uL (ref 0.0–0.5)
Eosinophils Relative: 8 %
HCT: 37 % — ABNORMAL LOW (ref 38.4–49.9)
HEMOGLOBIN: 12.3 g/dL — AB (ref 13.0–17.1)
LYMPHS PCT: 18 %
Lymphs Abs: 0.7 10*3/uL — ABNORMAL LOW (ref 0.9–3.3)
MCH: 30.1 pg (ref 27.2–33.4)
MCHC: 33.2 g/dL (ref 32.0–36.0)
MCV: 90.5 fL (ref 79.3–98.0)
MONOS PCT: 11 %
Monocytes Absolute: 0.4 10*3/uL (ref 0.1–0.9)
NEUTROS PCT: 62 %
Neutro Abs: 2.4 10*3/uL (ref 1.5–6.5)
Platelet Count: 91 10*3/uL — ABNORMAL LOW (ref 140–400)
RBC: 4.09 MIL/uL — AB (ref 4.20–5.82)
RDW: 14.1 % (ref 11.0–14.6)
WBC: 3.9 10*3/uL — AB (ref 4.0–10.3)

## 2018-01-21 LAB — CEA (IN HOUSE-CHCC): CEA (CHCC-In House): 696.38 ng/mL — ABNORMAL HIGH (ref 0.00–5.00)

## 2018-01-21 MED ORDER — PALONOSETRON HCL INJECTION 0.25 MG/5ML
INTRAVENOUS | Status: AC
Start: 2018-01-21 — End: ?
  Filled 2018-01-21: qty 5

## 2018-01-21 MED ORDER — HEPARIN SOD (PORK) LOCK FLUSH 100 UNIT/ML IV SOLN
500.0000 [IU] | Freq: Once | INTRAVENOUS | Status: DC | PRN
  Filled 2018-01-21: qty 5

## 2018-01-21 MED ORDER — PALONOSETRON HCL INJECTION 0.25 MG/5ML
0.2500 mg | Freq: Once | INTRAVENOUS | Status: AC
  Administered 2018-01-21: 0.25 mg via INTRAVENOUS

## 2018-01-21 MED ORDER — SODIUM CHLORIDE 0.9% FLUSH
10.0000 mL | INTRAVENOUS | Status: DC | PRN
  Filled 2018-01-21: qty 10

## 2018-01-21 MED ORDER — SODIUM CHLORIDE 0.9 % IV SOLN
1800.0000 mg/m2 | INTRAVENOUS | Status: DC
  Administered 2018-01-21: 4150 mg via INTRAVENOUS
  Filled 2018-01-21: qty 83

## 2018-01-21 MED ORDER — FAMOTIDINE IN NACL 20-0.9 MG/50ML-% IV SOLN
20.0000 mg | Freq: Two times a day (BID) | INTRAVENOUS | Status: DC
  Administered 2018-01-21: 20 mg via INTRAVENOUS

## 2018-01-21 MED ORDER — DEXTROSE 5 % IV SOLN
Freq: Once | INTRAVENOUS | Status: AC
  Administered 2018-01-21: 12:00:00 via INTRAVENOUS
  Filled 2018-01-21: qty 250

## 2018-01-21 MED ORDER — DEXAMETHASONE SODIUM PHOSPHATE 10 MG/ML IJ SOLN
10.0000 mg | Freq: Once | INTRAMUSCULAR | Status: AC
Start: 2018-01-21 — End: 2018-01-21
  Administered 2018-01-21: 10 mg via INTRAVENOUS

## 2018-01-21 MED ORDER — FAMOTIDINE IN NACL 20-0.9 MG/50ML-% IV SOLN
INTRAVENOUS | Status: AC
  Filled 2018-01-21: qty 50

## 2018-01-21 MED ORDER — SODIUM CHLORIDE 0.9% FLUSH
10.0000 mL | Freq: Once | INTRAVENOUS | Status: AC
  Administered 2018-01-21: 10 mL
  Filled 2018-01-21: qty 10

## 2018-01-21 MED ORDER — LEUCOVORIN CALCIUM INJECTION 350 MG
200.0000 mg/m2 | Freq: Once | INTRAMUSCULAR | Status: AC
  Administered 2018-01-21: 460 mg via INTRAVENOUS
  Filled 2018-01-21: qty 23

## 2018-01-21 MED ORDER — DIPHENHYDRAMINE HCL 50 MG/ML IJ SOLN
INTRAMUSCULAR | Status: AC
  Filled 2018-01-21: qty 1

## 2018-01-21 MED ORDER — DEXTROSE 5 % IV SOLN
65.0000 mg/m2 | Freq: Once | INTRAVENOUS | Status: AC
  Administered 2018-01-21: 150 mg via INTRAVENOUS
  Filled 2018-01-21: qty 30

## 2018-01-21 MED ORDER — DEXAMETHASONE SODIUM PHOSPHATE 10 MG/ML IJ SOLN
INTRAMUSCULAR | Status: AC
  Filled 2018-01-21: qty 1

## 2018-01-21 MED ORDER — DIPHENHYDRAMINE HCL 50 MG/ML IJ SOLN
25.0000 mg | Freq: Once | INTRAMUSCULAR | Status: AC
  Administered 2018-01-21: 25 mg via INTRAVENOUS

## 2018-01-21 NOTE — Telephone Encounter (Signed)
Scheduled appt per 8/12 los - gave patient AVS and calender per los. - central radiology to contact patient with ct scan.

## 2018-01-21 NOTE — Patient Instructions (Signed)
[facility] Discharge Instructions for Patients Receiving Chemotherapy  Today you received the following chemotherapy agents: Oxaliplatin (Eloxatin), Leucovorin, Fluorouracil (Adrucil, 5-FU)  To help prevent nausea and vomiting after your treatment, we encourage you to take your nausea medication as prescribed. Received Aloxi during treatment today-->Take Compazine (not Zofran) for the next 3 days as needed.   If you develop nausea and vomiting that is not controlled by your nausea medication, call the clinic.   BELOW ARE SYMPTOMS THAT SHOULD BE REPORTED IMMEDIATELY:  *FEVER GREATER THAN 100.5 F  *CHILLS WITH OR WITHOUT FEVER  NAUSEA AND VOMITING THAT IS NOT CONTROLLED WITH YOUR NAUSEA MEDICATION  *UNUSUAL SHORTNESS OF BREATH  *UNUSUAL BRUISING OR BLEEDING  TENDERNESS IN MOUTH AND THROAT WITH OR WITHOUT PRESENCE OF ULCERS  *URINARY PROBLEMS  *BOWEL PROBLEMS  UNUSUAL RASH Items with * indicate a potential emergency and should be followed up as soon as possible.  Feel free to call the clinic should you have any questions or concerns. The clinic phone number is [phone].  Please show the [facility] at check-in to the Emergency Department and triage nurse.

## 2018-01-21 NOTE — Progress Notes (Signed)
[facility] PROGRESS NOTE   Diagnosis: Rectal cancer  INTERVAL HISTORY:   [name] returns as scheduled.  He completed cycle 3 FOLFOX 01/08/2018.  He denies nausea/vomiting.  No mouth sores.  No change in baseline bowel habits.  He again had generalized "aches and chills" beginning day 1 and lasting for 2 days.  No rash.  No shortness of breath.  Objective:  Vital signs in last 24 hours:  Blood pressure 121/80, pulse 69, temperature 97.6 F (36.4 C), temperature source Oral, resp. rate 18, height '6\' 3"'  (1.905 m), weight 198 lb 3.2 oz (89.9 kg), SpO2 100 %.    HEENT: No thrush or ulcers. Resp: Lungs clear bilaterally. Cardio: Regular rate and rhythm. GI: Abdomen soft and nontender.  No hepatomegaly. Vascular: Trace edema right lower leg. Skin: 2 to 3 cm soft cutaneous lesions right upper anterior chest and left upper anterior chest. Port-A-Cath without erythema.  Lab Results:  Lab Results  Component Value Date   WBC 3.9 (L) 01/21/2018   HGB 12.3 (L) 01/21/2018   HCT 37.0 (L) 01/21/2018   MCV 90.5 01/21/2018   PLT 91 (L) 01/21/2018   NEUTROABS 2.4 01/21/2018    Imaging:  No results found.  Medications: I have reviewed the patient's current medications.  Assessment/Plan: 1. Rectal cancer  MRI lumbar spine 10/20/2016 with bilateral hydronephrosis and retroperitoneal adenopathy  CT abdomen/pelvis 10/20/2016 with extensive retroperitoneal process with marked interstitial thickening and lymphadenopathy; bilateral hydronephrosis; similar ill-defined soft tissue density and skin thickening around the umbilicus; diffuse bladder wall thickening slightly asymmetric at the bladder dome but no obvious bladder mass  Biopsy para-aortic lymph node 11/10/2016-metastatic adenocarcinoma consistent with GI primary  11/23/2016 CEA elevated, CA-19-9 normal  Colonoscopy 11/27/2016-fungating infiltrative nonobstructing mass in the distal rectum involving the  anal verge. The rectum was fixed and frozen in the pelvis precluding passage of scope beyond the distal sigmoid colon. 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  Foundation 1-microsatellite stable; tumor mutational burden 5; NOTCH3amplification; NOTCH3rearrangement exon 14; [id number]  Upper endoscopy 11/27/2016-normal.  PET scan 06/22/2018showed minimal activity in the primary rectal carcinoma, diffuse wall thickening; mild activity associate with retroperitoneal lymph nodes; persistent haziness within the retroperitoneum. No evidence of liver metastasis or distant metastasis.  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  Cycle 2 FOLFOX 12/20/2016  Cycle 3 FOLFOX 01/03/2017 (oxaliplatin dose reduced secondary to thrombocytopenia)   Cycle 4 FOLFOX 01/17/2017 (oxaliplatin held due to thrombocytopenia)  Cycle 5 FOLFOX 02/15/2017  CTs 02/26/2017-new sclerotic lesions in the spine and right iliac, no residual adenopathy, persistent rectal wall thickening  Cycle 6 FOLFOX 03/01/2017 (Neulasta added, oxaliplatin further dose reduced)   Cycle 7 FOLFOX 03/15/2017 (oxaliplatin held secondary to thrombocytopenia)  Cycle 8 FOLFOX 03/29/2017  Cycle 9 FOLFOX 04/12/2017 (oxaliplatin held secondary to thrombocytopenia and neuropathy)  Cycle 10 FOLFOX 04/25/2017 (oxaliplatin held secondary to neuropathy)  CTs 05/07/2017-stable rectal thickening, stable periaortic soft tissue, stable sclerotic lesions at the lumbar spine and pelvis  Treatment changed to maintenance capecitabine beginning 05/14/2017  CTs 08/03/2017-irregular annular wall thickening mid to lower rectum appears mildly increased; newly enlarged infiltrative retroperitoneal nodal metastasis; mild left supraclavicular adenopathy  appears minimally increased; ill-defined umbilical soft tissue thickening stable; small scattered sclerotic bone lesions stable.  Cycle 1 FOLFIRI/Panitumumab 08/20/2017  Cycle 2 FOLFIRI/Panitumumab 09/10/2017 (irinotecan and 5-FU dose reduced due to mucositis)  Cycle  3 FOLFIRI/panitumumab 09/24/2017  Cycle 4 FOLFIRI/panitumumab 10/08/2017 (5-fluorouracil further dose reduced secondary to mucositis)  Cycle 5 FOLFIRI/Panitumumab 10/29/2017  CTs 11/09/2017-increase in mediastinal, retroperitoneal, and mesenteric adenopathy, increased wall thickening of the rectum, increased sclerotic lesions at the thoracic spine  Cycle 1 FOLFOX 12/06/2017  Cycle 2 FOLFOX 12/24/2017  Cycle 3 FOLFOX 01/08/2018  Cycle 4 FOLFOX 01/21/2018 2. Retroperitoneal lymphadenopathy secondary to #1 3. Bilateral hydronephrosis status post evaluation by urology 4. Asymmetry of the bladder dome on CT 10/20/2016 status post cystoscopy with biopsy planned 12/11/2016 (Dr. 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 Change in bowel habits, secondary to #1-improved. 6. Right leg edema, question due to lymphatic obstruction;venous Doppler negative for DVT 11/23/2016. 7. Port-A-Cath placement 12/05/2016 8. Thrombocytopenia secondary to chemotherapy; oxaliplatin dose reduced with cycle 3 FOLFOX. Progressive thrombocytopenia 01/17/2017; oxaliplatin held 01/17/2017. 9. Right lower extremity cellulitis 01/27/2017-blood culture positive for methicillin sensitive staph aureus; Port-A-Cath removed. PICC line placed. TEE 01/30/2017 without evidence of vegetation. 14 days of Ancef recommended after Port-A-Cath removal with end date 02/14/2017. 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 Mucositis following cycle 1 FOLFIRI/Panitumumab-cycle 2 delayed andirinotecan/5-FU dose reduced.Improved 12. Chills and generalized aches following cycles 2 and 3  FOLFOX  Disposition: Nicholas Suarez appears stable.  He has completed 3 cycles of FOLFOX.  Plan to proceed with cycle 4 today as scheduled.  The plan is for restaging CTs after he has completed 5 cycles.  We reviewed the CBC from today.  Counts are adequate for treatment.  He has mild thrombocytopenia.  He understands to contact the office with any bleeding.  The etiology of the chills and generalized aches for a few days after the chemotherapy is unclear.  Question related to Oxaliplatin.  He will return for lab, follow-up and cycle 5 FOLFOX in 2 weeks.  He will contact the office in the interim with any problems.  Plan reviewed with Dr. Benay Spice.   Ned Card ANP/GNP-BC   01/21/2018  11:13 AM

## 2018-01-23 ENCOUNTER — Inpatient Hospital Stay: Payer: Medicare Other

## 2018-01-23 VITALS — BP 118/72 | HR 90 | Temp 97.8°F | Resp 20

## 2018-01-23 DIAGNOSIS — Z5111 Encounter for antineoplastic chemotherapy: Secondary | ICD-10-CM | POA: Diagnosis not present

## 2018-01-23 DIAGNOSIS — C20 Malignant neoplasm of rectum: Secondary | ICD-10-CM

## 2018-01-23 MED ORDER — HEPARIN SOD (PORK) LOCK FLUSH 100 UNIT/ML IV SOLN
500.0000 [IU] | Freq: Once | INTRAVENOUS | Status: AC | PRN
  Administered 2018-01-23: 500 [IU]
  Filled 2018-01-23: qty 5

## 2018-01-23 MED ORDER — SODIUM CHLORIDE 0.9% FLUSH
10.0000 mL | INTRAVENOUS | Status: DC | PRN
  Administered 2018-01-23: 10 mL
  Filled 2018-01-23: qty 10

## 2018-01-23 NOTE — Patient Instructions (Signed)
Implanted Port Home Guide An implanted port is a type of central line that is placed under the skin. Central lines are used to provide IV access when treatment or nutrition needs to be given through a person's veins. Implanted ports are used for long-term IV access. An implanted port may be placed because:  You need IV medicine that would be irritating to the small veins in your hands or arms.  You need long-term IV medicines, such as antibiotics.  You need IV nutrition for a long period.  You need frequent blood draws for lab tests.  You need dialysis.  Implanted ports are usually placed in the chest area, but they can also be placed in the upper arm, the abdomen, or the leg. An implanted port has two main parts:  Reservoir. The reservoir is round and will appear as a small, raised area under your skin. The reservoir is the part where a needle is inserted to give medicines or draw blood.  Catheter. The catheter is a thin, flexible tube that extends from the reservoir. The catheter is placed into a large vein. Medicine that is inserted into the reservoir goes into the catheter and then into the vein.  How will I care for my incision site? Do not get the incision site wet. Bathe or shower as directed by your health care provider. How is my port accessed? Special steps must be taken to access the port:  Before the port is accessed, a numbing cream can be placed on the skin. This helps numb the skin over the port site.  Your health care provider uses a sterile technique to access the port. ? Your health care provider must put on a mask and sterile gloves. ? The skin over your port is cleaned carefully with an antiseptic and allowed to dry. ? The port is gently pinched between sterile gloves, and a needle is inserted into the port.  Only "non-coring" port needles should be used to access the port. Once the port is accessed, a blood return should be checked. This helps ensure that the port  is in the vein and is not clogged.  If your port needs to remain accessed for a constant infusion, a clear (transparent) bandage will be placed over the needle site. The bandage and needle will need to be changed every week, or as directed by your health care provider.  Keep the bandage covering the needle clean and dry. Do not get it wet. Follow your health care provider's instructions on how to take a shower or bath while the port is accessed.  If your port does not need to stay accessed, no bandage is needed over the port.  What is flushing? Flushing helps keep the port from getting clogged. Follow your health care provider's instructions on how and when to flush the port. Ports are usually flushed with saline solution or a medicine called heparin. The need for flushing will depend on how the port is used.  If the port is used for intermittent medicines or blood draws, the port will need to be flushed: ? After medicines have been given. ? After blood has been drawn. ? As part of routine maintenance.  If a constant infusion is running, the port may not need to be flushed.  How long will my port stay implanted? The port can stay in for as long as your health care provider thinks it is needed. When it is time for the port to come out, surgery will be   done to remove it. The procedure is similar to the one performed when the port was put in. When should I seek immediate medical care? When you have an implanted port, you should seek immediate medical care if:  You notice a bad smell coming from the incision site.  You have swelling, redness, or drainage at the incision site.  You have more swelling or pain at the port site or the surrounding area.  You have a fever that is not controlled with medicine.  This information is not intended to replace advice given to you by your health care provider. Make sure you discuss any questions you have with your health care provider. Document  Released: 05/29/2005 Document Revised: 11/04/2015 Document Reviewed: 02/03/2013 Elsevier Interactive Patient Education  2017 Elsevier Inc.  

## 2018-02-01 ENCOUNTER — Other Ambulatory Visit: Payer: Self-pay | Admitting: Emergency Medicine

## 2018-02-01 DIAGNOSIS — C20 Malignant neoplasm of rectum: Secondary | ICD-10-CM

## 2018-02-03 ENCOUNTER — Other Ambulatory Visit: Payer: Self-pay | Admitting: Oncology

## 2018-02-04 ENCOUNTER — Inpatient Hospital Stay: Payer: Medicare Other

## 2018-02-04 ENCOUNTER — Inpatient Hospital Stay (HOSPITAL_BASED_OUTPATIENT_CLINIC_OR_DEPARTMENT_OTHER): Payer: Medicare Other | Admitting: Oncology

## 2018-02-04 VITALS — BP 103/67 | HR 73 | Temp 97.7°F | Resp 18 | Ht 75.0 in | Wt 198.7 lb

## 2018-02-04 DIAGNOSIS — C20 Malignant neoplasm of rectum: Secondary | ICD-10-CM

## 2018-02-04 DIAGNOSIS — R52 Pain, unspecified: Secondary | ICD-10-CM | POA: Diagnosis not present

## 2018-02-04 DIAGNOSIS — R6883 Chills (without fever): Secondary | ICD-10-CM

## 2018-02-04 DIAGNOSIS — Z5111 Encounter for antineoplastic chemotherapy: Secondary | ICD-10-CM | POA: Diagnosis not present

## 2018-02-04 DIAGNOSIS — G620 Drug-induced polyneuropathy: Secondary | ICD-10-CM | POA: Diagnosis not present

## 2018-02-04 LAB — CBC WITH DIFFERENTIAL (CANCER CENTER ONLY)
Basophils Absolute: 0.1 10*3/uL (ref 0.0–0.1)
Basophils Relative: 2 %
Eosinophils Absolute: 0.3 10*3/uL (ref 0.0–0.5)
Eosinophils Relative: 6 %
HCT: 36.3 % — ABNORMAL LOW (ref 38.4–49.9)
HEMOGLOBIN: 12.1 g/dL — AB (ref 13.0–17.1)
LYMPHS ABS: 0.7 10*3/uL — AB (ref 0.9–3.3)
LYMPHS PCT: 18 %
MCH: 29.8 pg (ref 27.2–33.4)
MCHC: 33.2 g/dL (ref 32.0–36.0)
MCV: 89.6 fL (ref 79.3–98.0)
MONOS PCT: 11 %
Monocytes Absolute: 0.4 10*3/uL (ref 0.1–0.9)
NEUTROS ABS: 2.5 10*3/uL (ref 1.5–6.5)
NEUTROS PCT: 63 %
Platelet Count: 105 10*3/uL — ABNORMAL LOW (ref 140–400)
RBC: 4.05 MIL/uL — AB (ref 4.20–5.82)
RDW: 14.4 % (ref 11.0–14.6)
WBC: 3.9 10*3/uL — AB (ref 4.0–10.3)

## 2018-02-04 LAB — CMP (CANCER CENTER ONLY)
ALK PHOS: 90 U/L (ref 38–126)
ALT: 16 U/L (ref 0–44)
ANION GAP: 5 (ref 5–15)
AST: 29 U/L (ref 15–41)
Albumin: 3.2 g/dL — ABNORMAL LOW (ref 3.5–5.0)
BILIRUBIN TOTAL: 0.5 mg/dL (ref 0.3–1.2)
BUN: 13 mg/dL (ref 8–23)
CO2: 27 mmol/L (ref 22–32)
CREATININE: 0.87 mg/dL (ref 0.61–1.24)
Calcium: 10.1 mg/dL (ref 8.9–10.3)
Chloride: 106 mmol/L (ref 98–111)
GFR, Estimated: >60 mL/min (ref >60)
GLUCOSE: 96 mg/dL (ref 70–99)
Potassium: 4.1 mmol/L (ref 3.5–5.1)
Sodium: 138 mmol/L (ref 135–145)
TOTAL PROTEIN: 6.8 g/dL (ref 6.5–8.1)

## 2018-02-04 MED ORDER — DIPHENHYDRAMINE HCL 50 MG/ML IJ SOLN
25.0000 mg | Freq: Once | INTRAMUSCULAR | Status: AC
  Administered 2018-02-04: 25 mg via INTRAVENOUS

## 2018-02-04 MED ORDER — SODIUM CHLORIDE 0.9 % IV SOLN
1800.0000 mg/m2 | INTRAVENOUS | Status: DC
  Administered 2018-02-04: 4150 mg via INTRAVENOUS
  Filled 2018-02-04: qty 83

## 2018-02-04 MED ORDER — DEXAMETHASONE SODIUM PHOSPHATE 10 MG/ML IJ SOLN
INTRAMUSCULAR | Status: AC
  Filled 2018-02-04: qty 1

## 2018-02-04 MED ORDER — DIPHENHYDRAMINE HCL 50 MG/ML IJ SOLN
INTRAMUSCULAR | Status: AC
  Filled 2018-02-04: qty 1

## 2018-02-04 MED ORDER — PALONOSETRON HCL INJECTION 0.25 MG/5ML
INTRAVENOUS | Status: AC
  Filled 2018-02-04: qty 5

## 2018-02-04 MED ORDER — PALONOSETRON HCL INJECTION 0.25 MG/5ML
0.2500 mg | Freq: Once | INTRAVENOUS | Status: AC
  Administered 2018-02-04: 0.25 mg via INTRAVENOUS

## 2018-02-04 MED ORDER — FAMOTIDINE IN NACL 20-0.9 MG/50ML-% IV SOLN
20.0000 mg | Freq: Once | INTRAVENOUS | Status: AC
  Administered 2018-02-04: 20 mg via INTRAVENOUS

## 2018-02-04 MED ORDER — OXALIPLATIN CHEMO INJECTION 100 MG/20ML
65.0000 mg/m2 | Freq: Once | INTRAVENOUS | Status: AC
  Administered 2018-02-04: 150 mg via INTRAVENOUS
  Filled 2018-02-04: qty 10

## 2018-02-04 MED ORDER — DEXTROSE 5 % IV SOLN
Freq: Once | INTRAVENOUS | Status: AC
  Administered 2018-02-04: 10:00:00 via INTRAVENOUS
  Filled 2018-02-04: qty 250

## 2018-02-04 MED ORDER — OXALIPLATIN CHEMO INJECTION 100 MG/20ML: 65.0000 mg/m2 | Freq: Once | INTRAVENOUS | Status: DC

## 2018-02-04 MED ORDER — FAMOTIDINE IN NACL 20-0.9 MG/50ML-% IV SOLN
INTRAVENOUS | Status: AC
  Filled 2018-02-04: qty 50

## 2018-02-04 MED ORDER — DEXTROSE 5 % IV SOLN
INTRAVENOUS | Status: DC
  Administered 2018-02-04: 13:00:00 via INTRAVENOUS
  Filled 2018-02-04: qty 250

## 2018-02-04 MED ORDER — DEXAMETHASONE SODIUM PHOSPHATE 10 MG/ML IJ SOLN
10.0000 mg | Freq: Once | INTRAMUSCULAR | Status: AC
  Administered 2018-02-04: 10 mg via INTRAVENOUS

## 2018-02-04 MED ORDER — LEUCOVORIN CALCIUM INJECTION 350 MG
200.0000 mg/m2 | Freq: Once | INTRAVENOUS | Status: AC
  Administered 2018-02-04: 460 mg via INTRAVENOUS
  Filled 2018-02-04: qty 23

## 2018-02-04 NOTE — Patient Instructions (Signed)
[facility] Discharge Instructions for Patients Receiving Chemotherapy  Today you received the following chemotherapy agents Oxaliplatin, Leucovorin, 5FU  To help prevent nausea and vomiting after your treatment, we encourage you to take your nausea medication as directed   If you develop nausea and vomiting that is not controlled by your nausea medication, call the clinic.   BELOW ARE SYMPTOMS THAT SHOULD BE REPORTED IMMEDIATELY:  *FEVER GREATER THAN 100.5 F  *CHILLS WITH OR WITHOUT FEVER  NAUSEA AND VOMITING THAT IS NOT CONTROLLED WITH YOUR NAUSEA MEDICATION  *UNUSUAL SHORTNESS OF BREATH  *UNUSUAL BRUISING OR BLEEDING  TENDERNESS IN MOUTH AND THROAT WITH OR WITHOUT PRESENCE OF ULCERS  *URINARY PROBLEMS  *BOWEL PROBLEMS  UNUSUAL RASH Items with * indicate a potential emergency and should be followed up as soon as possible.  Feel free to call the clinic should you have any questions or concerns. The clinic phone number is [phone].  Please show the CHEMO ALERT CARD at check-in to the Emergency Department and triage nurse.   

## 2018-02-04 NOTE — Progress Notes (Signed)
[place] OFFICE PROGRESS NOTE   Diagnosis: Rectal cancer  INTERVAL HISTORY:   Mr. Kabel returns as scheduled.  He completed another cycle of FOLFOX on 01/21/2018.  He reports feeling "flulike "for 1-2 days following chemotherapy.  No symptom of an allergic reaction.  He feels the skin lesions at the upper chest and neck are improving.  His bowels are moving.  No pain.  Intermittent swelling in the legs. He has numbness in the right foot.  This does not interfere with activity.  Objective:  Vital signs in last 24 hours:  Blood pressure 103/67, pulse 73, temperature 97.7 F (36.5 C), temperature source Oral, resp. rate 18, height '6\' 3"'  (1.905 m), weight 198 lb 11.2 oz (90.1 kg), SpO2 100 %.    HEENT: No thrush, healing ulcer at the right buccal mucosa Resp: Lungs clear bilaterally Cardio: Regular rate and rhythm GI: No hepatosplenomegaly, nontender Vascular: Trace edema at the right lower leg  Skin: Raised erythematous nodular lesions at the right upper anterior chest, left lower neck, erythematous 3-4 mm nodular lesions at the right abdomen Neurologic: Mild to moderate loss of vibratory sense of the fingertips bilaterally  Portacath/PICC-without erythema  Lab Results:  Lab Results  Component Value Date   WBC 3.9 (L) 02/04/2018   HGB 12.1 (L) 02/04/2018   HCT 36.3 (L) 02/04/2018   MCV 89.6 02/04/2018   PLT 105 (L) 02/04/2018   NEUTROABS 2.5 02/04/2018    CMP  Lab Results  Component Value Date   NA 138 02/04/2018   K 4.1 02/04/2018   CL 106 02/04/2018   CO2 27 02/04/2018   GLUCOSE 96 02/04/2018   BUN 13 02/04/2018   CREATININE 0.87 02/04/2018   CALCIUM 10.1 02/04/2018   PROT 6.8 02/04/2018   ALBUMIN 3.2 (L) 02/04/2018   AST 29 02/04/2018   ALT 16 02/04/2018   ALKPHOS 90 02/04/2018   BILITOT 0.5 02/04/2018   GFRNONAA >60 02/04/2018   GFRAA >60 02/04/2018    Lab Results  Component Value Date   CEA1 696.38 (H) 01/21/2018      Medications: I have reviewed the patient's current medications.   Assessment/Plan: 1. Rectal cancer  MRI lumbar spine 10/20/2016 with bilateral hydronephrosis and retroperitoneal adenopathy  CT abdomen/pelvis 10/20/2016 with extensive retroperitoneal process with marked interstitial thickening and lymphadenopathy; bilateral hydronephrosis; similar ill-defined soft tissue density and skin thickening around the umbilicus; diffuse bladder wall thickening slightly asymmetric at the bladder dome but no obvious bladder mass  Biopsy para-aortic lymph node 11/10/2016-metastatic adenocarcinoma consistent with GI primary  11/23/2016 CEA elevated, CA-19-9 normal  Colonoscopy 11/27/2016-fungating infiltrative nonobstructing mass in the distal rectum involving the anal verge. The rectum was fixed and frozen in the pelvis precluding passage of scope beyond the distal sigmoid colon. 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  Foundation 1-microsatellite stable; tumor mutational burden 5; NOTCH3amplification; NOTCH3rearrangement exon 14; [id number]  Upper endoscopy 11/27/2016-normal.  PET scan 06/22/2018showed minimal activity in the primary rectal carcinoma, diffuse wall thickening; mild activity associate with retroperitoneal lymph nodes; persistent haziness within the retroperitoneum. No evidence of liver metastasis or distant metastasis.  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  Cycle 2 FOLFOX 12/20/2016  Cycle 3 FOLFOX 01/03/2017 (oxaliplatin dose reduced secondary to thrombocytopenia)   Cycle 4 FOLFOX 01/17/2017 (oxaliplatin held due to thrombocytopenia)  Cycle 5 FOLFOX 02/15/2017  CTs 02/26/2017-new sclerotic lesions  in the spine and right iliac, no residual adenopathy,  persistent rectal wall thickening  Cycle 6 FOLFOX 03/01/2017 (Neulasta added, oxaliplatin further dose reduced)   Cycle 7 FOLFOX 03/15/2017 (oxaliplatin held secondary to thrombocytopenia)  Cycle 8 FOLFOX 03/29/2017  Cycle 9 FOLFOX 04/12/2017 (oxaliplatin held secondary to thrombocytopenia and neuropathy)  Cycle 10 FOLFOX 04/25/2017 (oxaliplatin held secondary to neuropathy)  CTs 05/07/2017-stable rectal thickening, stable periaortic soft tissue, stable sclerotic lesions at the lumbar spine and pelvis  Treatment changed to maintenance capecitabine beginning 05/14/2017  CTs 08/03/2017-irregular annular wall thickening mid to lower rectum appears mildly increased; newly enlarged infiltrative retroperitoneal nodal metastasis; mild left supraclavicular adenopathy appears minimally increased; ill-defined umbilical soft tissue thickening stable; small scattered sclerotic bone lesions stable.  Cycle 1 FOLFIRI/Panitumumab 08/20/2017  Cycle 2 FOLFIRI/Panitumumab 09/10/2017 (irinotecan and 5-FU dose reduced due to mucositis)  Cycle 3 FOLFIRI/panitumumab 09/24/2017  Cycle 4 FOLFIRI/panitumumab 10/08/2017 (5-fluorouracil further dose reduced secondary to mucositis)  Cycle 5 FOLFIRI/Panitumumab 10/29/2017  CTs 11/09/2017-increase in mediastinal, retroperitoneal, and mesenteric adenopathy, increased wall thickening of the rectum, increased sclerotic lesions at the thoracic spine  Cycle 1 FOLFOX 12/06/2017  Cycle 2 FOLFOX 12/24/2017  Cycle 3 FOLFOX 01/08/2018  Cycle 4 FOLFOX 01/21/2018  Cycle 5 FOLFOX 02/04/2018 2. Retroperitoneal lymphadenopathy secondary to #1 3. Bilateral hydronephrosis status post evaluation by urology 4. Asymmetry of the bladder dome on CT 10/20/2016 status post cystoscopy with biopsy planned 12/11/2016 (Dr. 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 Change in bowel habits, secondary to  #1-improved. 6. Right leg edema, question due to lymphatic obstruction;venous Doppler negative for DVT 11/23/2016. 7. Port-A-Cath placement 12/05/2016 8. Thrombocytopenia secondary to chemotherapy; oxaliplatin dose reduced with cycle 3 FOLFOX. Progressive thrombocytopenia 01/17/2017; oxaliplatin held 01/17/2017. 9. Right lower extremity cellulitis 01/27/2017-blood culture positive for methicillin sensitive staph aureus; Port-A-Cath removed. PICC line placed. TEE 01/30/2017 without evidence of vegetation. 14 days of Ancef recommended after Port-A-Cath removal with end date 02/14/2017. 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 Mucositis following cycle 1 FOLFIRI/Panitumumab-cycle 2 delayed andirinotecan/5-FU dose reduced.Improved 12. Chills and generalized aches following cycles 2, 3, and 4 FOLFOX   Disposition: Nicholas Suarez appears stable.  He continues to have flulike symptoms following chemotherapy, but no other sign of an allergic reaction.  He has mild oxaliplatin neuropathy.  He will complete cycle 5 of salvage FOLFOX today.  He will then undergo restaging CTs.  He will return for an office visit 02/18/2018.  Betsy Coder, MD  02/04/2018  10:10 AM

## 2018-02-06 ENCOUNTER — Inpatient Hospital Stay: Payer: Medicare Other

## 2018-02-06 DIAGNOSIS — Z5111 Encounter for antineoplastic chemotherapy: Secondary | ICD-10-CM | POA: Diagnosis not present

## 2018-02-06 DIAGNOSIS — C20 Malignant neoplasm of rectum: Secondary | ICD-10-CM

## 2018-02-06 MED ORDER — HEPARIN SOD (PORK) LOCK FLUSH 100 UNIT/ML IV SOLN
500.0000 [IU] | Freq: Once | INTRAVENOUS | Status: AC | PRN
  Administered 2018-02-06: 500 [IU]
  Filled 2018-02-06: qty 5

## 2018-02-06 MED ORDER — SODIUM CHLORIDE 0.9% FLUSH
10.0000 mL | INTRAVENOUS | Status: DC | PRN
  Administered 2018-02-06: 10 mL
  Filled 2018-02-06: qty 10

## 2018-02-06 NOTE — Patient Instructions (Signed)
Implanted Port Home Guide An implanted port is a type of central line that is placed under the skin. Central lines are used to provide IV access when treatment or nutrition needs to be given through a person's veins. Implanted ports are used for long-term IV access. An implanted port may be placed because:  You need IV medicine that would be irritating to the small veins in your hands or arms.  You need long-term IV medicines, such as antibiotics.  You need IV nutrition for a long period.  You need frequent blood draws for lab tests.  You need dialysis.  Implanted ports are usually placed in the chest area, but they can also be placed in the upper arm, the abdomen, or the leg. An implanted port has two main parts:  Reservoir. The reservoir is round and will appear as a small, raised area under your skin. The reservoir is the part where a needle is inserted to give medicines or draw blood.  Catheter. The catheter is a thin, flexible tube that extends from the reservoir. The catheter is placed into a large vein. Medicine that is inserted into the reservoir goes into the catheter and then into the vein.  How will I care for my incision site? Do not get the incision site wet. Bathe or shower as directed by your health care provider. How is my port accessed? Special steps must be taken to access the port:  Before the port is accessed, a numbing cream can be placed on the skin. This helps numb the skin over the port site.  Your health care provider uses a sterile technique to access the port. ? Your health care provider must put on a mask and sterile gloves. ? The skin over your port is cleaned carefully with an antiseptic and allowed to dry. ? The port is gently pinched between sterile gloves, and a needle is inserted into the port.  Only "non-coring" port needles should be used to access the port. Once the port is accessed, a blood return should be checked. This helps ensure that the port  is in the vein and is not clogged.  If your port needs to remain accessed for a constant infusion, a clear (transparent) bandage will be placed over the needle site. The bandage and needle will need to be changed every week, or as directed by your health care provider.  Keep the bandage covering the needle clean and dry. Do not get it wet. Follow your health care provider's instructions on how to take a shower or bath while the port is accessed.  If your port does not need to stay accessed, no bandage is needed over the port.  What is flushing? Flushing helps keep the port from getting clogged. Follow your health care provider's instructions on how and when to flush the port. Ports are usually flushed with saline solution or a medicine called heparin. The need for flushing will depend on how the port is used.  If the port is used for intermittent medicines or blood draws, the port will need to be flushed: ? After medicines have been given. ? After blood has been drawn. ? As part of routine maintenance.  If a constant infusion is running, the port may not need to be flushed.  How long will my port stay implanted? The port can stay in for as long as your health care provider thinks it is needed. When it is time for the port to come out, surgery will be   done to remove it. The procedure is similar to the one performed when the port was put in. When should I seek immediate medical care? When you have an implanted port, you should seek immediate medical care if:  You notice a bad smell coming from the incision site.  You have swelling, redness, or drainage at the incision site.  You have more swelling or pain at the port site or the surrounding area.  You have a fever that is not controlled with medicine.  This information is not intended to replace advice given to you by your health care provider. Make sure you discuss any questions you have with your health care provider. Document  Released: 05/29/2005 Document Revised: 11/04/2015 Document Reviewed: 02/03/2013 Elsevier Interactive Patient Education  2017 Elsevier Inc.  

## 2018-02-11 ENCOUNTER — Other Ambulatory Visit: Payer: Self-pay | Admitting: Oncology

## 2018-02-14 ENCOUNTER — Ambulatory Visit (HOSPITAL_COMMUNITY)
Admission: RE | Admit: 2018-02-14 | Discharge: 2018-02-14 | Disposition: A | Discharge status: Home / Self Care | Payer: Medicare Other | Source: Ambulatory Visit | Attending: Nurse Practitioner | Admitting: Nurse Practitioner

## 2018-02-14 ENCOUNTER — Encounter (HOSPITAL_COMMUNITY): Payer: Self-pay

## 2018-02-14 DIAGNOSIS — R188 Other ascites: Secondary | ICD-10-CM | POA: Diagnosis not present

## 2018-02-14 DIAGNOSIS — M899 Disorder of bone, unspecified: Secondary | ICD-10-CM | POA: Insufficient documentation

## 2018-02-14 DIAGNOSIS — N3289 Other specified disorders of bladder: Secondary | ICD-10-CM | POA: Insufficient documentation

## 2018-02-14 DIAGNOSIS — R590 Localized enlarged lymph nodes: Secondary | ICD-10-CM | POA: Insufficient documentation

## 2018-02-14 DIAGNOSIS — C20 Malignant neoplasm of rectum: Secondary | ICD-10-CM | POA: Diagnosis not present

## 2018-02-14 DIAGNOSIS — M7989 Other specified soft tissue disorders: Secondary | ICD-10-CM | POA: Diagnosis not present

## 2018-02-14 MED ORDER — IOHEXOL 300 MG/ML  SOLN
100.0000 mL | Freq: Once | INTRAMUSCULAR | Status: AC | PRN
  Administered 2018-02-14: 100 mL via INTRAVENOUS

## 2018-02-18 ENCOUNTER — Inpatient Hospital Stay: Payer: Medicare Other | Attending: Oncology

## 2018-02-18 ENCOUNTER — Inpatient Hospital Stay: Payer: Medicare Other

## 2018-02-18 ENCOUNTER — Telehealth: Payer: Self-pay | Admitting: Oncology

## 2018-02-18 ENCOUNTER — Inpatient Hospital Stay (HOSPITAL_BASED_OUTPATIENT_CLINIC_OR_DEPARTMENT_OTHER): Payer: Medicare Other | Admitting: Oncology

## 2018-02-18 VITALS — BP 110/76 | HR 77 | Temp 97.8°F | Resp 20 | Ht 75.0 in | Wt 200.6 lb

## 2018-02-18 DIAGNOSIS — C20 Malignant neoplasm of rectum: Secondary | ICD-10-CM | POA: Insufficient documentation

## 2018-02-18 DIAGNOSIS — M7989 Other specified soft tissue disorders: Secondary | ICD-10-CM | POA: Insufficient documentation

## 2018-02-18 DIAGNOSIS — Z5111 Encounter for antineoplastic chemotherapy: Secondary | ICD-10-CM | POA: Insufficient documentation

## 2018-02-18 DIAGNOSIS — Z95828 Presence of other vascular implants and grafts: Secondary | ICD-10-CM

## 2018-02-18 LAB — CMP (CANCER CENTER ONLY)
ALK PHOS: 84 U/L (ref 38–126)
ALT: 14 U/L (ref 0–44)
AST: 26 U/L (ref 15–41)
Albumin: 3 g/dL — ABNORMAL LOW (ref 3.5–5.0)
Anion gap: 4 — ABNORMAL LOW (ref 5–15)
BILIRUBIN TOTAL: 0.5 mg/dL (ref 0.3–1.2)
BUN: 13 mg/dL (ref 8–23)
CALCIUM: 10.1 mg/dL (ref 8.9–10.3)
CO2: 26 mmol/L (ref 22–32)
CREATININE: 0.91 mg/dL (ref 0.61–1.24)
Chloride: 109 mmol/L (ref 98–111)
GFR, Estimated: >60 mL/min (ref >60)
GLUCOSE: 92 mg/dL (ref 70–99)
Potassium: 4.2 mmol/L (ref 3.5–5.1)
SODIUM: 139 mmol/L (ref 135–145)
TOTAL PROTEIN: 7 g/dL (ref 6.5–8.1)

## 2018-02-18 LAB — CBC WITH DIFFERENTIAL (CANCER CENTER ONLY)
BASOS ABS: 0 10*3/uL (ref 0.0–0.1)
Basophils Relative: 1 %
EOS ABS: 0.3 10*3/uL (ref 0.0–0.5)
EOS PCT: 8 %
HCT: 35.8 % — ABNORMAL LOW (ref 38.4–49.9)
Hemoglobin: 11.9 g/dL — ABNORMAL LOW (ref 13.0–17.1)
Lymphocytes Relative: 20 %
Lymphs Abs: 0.8 10*3/uL — ABNORMAL LOW (ref 0.9–3.3)
MCH: 29.5 pg (ref 27.2–33.4)
MCHC: 33.2 g/dL (ref 32.0–36.0)
MCV: 88.8 fL (ref 79.3–98.0)
MONO ABS: 0.5 10*3/uL (ref 0.1–0.9)
Monocytes Relative: 11 %
Neutro Abs: 2.5 10*3/uL (ref 1.5–6.5)
Neutrophils Relative %: 60 %
PLATELETS: 108 10*3/uL — AB (ref 140–400)
RBC: 4.03 MIL/uL — AB (ref 4.20–5.82)
RDW: 14.8 % — AB (ref 11.0–14.6)
WBC: 4.1 10*3/uL (ref 4.0–10.3)

## 2018-02-18 LAB — CEA (IN HOUSE-CHCC): CEA (CHCC-In House): 876 ng/mL — ABNORMAL HIGH (ref 0.00–5.00)

## 2018-02-18 MED ORDER — SODIUM CHLORIDE 0.9% FLUSH
10.0000 mL | Freq: Once | INTRAVENOUS | Status: AC
  Administered 2018-02-18: 10 mL
  Filled 2018-02-18: qty 10

## 2018-02-18 MED ORDER — FAMOTIDINE IN NACL 20-0.9 MG/50ML-% IV SOLN
INTRAVENOUS | Status: AC
  Filled 2018-02-18: qty 50

## 2018-02-18 MED ORDER — DEXAMETHASONE SODIUM PHOSPHATE 10 MG/ML IJ SOLN
10.0000 mg | Freq: Once | INTRAMUSCULAR | Status: AC
  Administered 2018-02-18: 10 mg via INTRAVENOUS

## 2018-02-18 MED ORDER — PALONOSETRON HCL INJECTION 0.25 MG/5ML
INTRAVENOUS | Status: AC
  Filled 2018-02-18: qty 5

## 2018-02-18 MED ORDER — PALONOSETRON HCL INJECTION 0.25 MG/5ML
0.2500 mg | Freq: Once | INTRAVENOUS | Status: AC
  Administered 2018-02-18: 0.25 mg via INTRAVENOUS

## 2018-02-18 MED ORDER — OXALIPLATIN CHEMO INJECTION 100 MG/20ML: 65.0000 mg/m2 | Freq: Once | INTRAVENOUS | Status: DC

## 2018-02-18 MED ORDER — DEXTROSE 5 % IV SOLN
Freq: Once | INTRAVENOUS | Status: AC
  Administered 2018-02-18: 12:00:00 via INTRAVENOUS
  Filled 2018-02-18: qty 250

## 2018-02-18 MED ORDER — DEXTROSE 5 % IV SOLN
INTRAVENOUS | Status: DC
  Administered 2018-02-18: 13:00:00 via INTRAVENOUS
  Filled 2018-02-18: qty 250

## 2018-02-18 MED ORDER — SODIUM CHLORIDE 0.9 % IV SOLN
1800.0000 mg/m2 | INTRAVENOUS | Status: DC
  Administered 2018-02-18: 4150 mg via INTRAVENOUS
  Filled 2018-02-18: qty 83

## 2018-02-18 MED ORDER — OXALIPLATIN CHEMO INJECTION 100 MG/20ML
65.0000 mg/m2 | Freq: Once | INTRAVENOUS | Status: AC
  Administered 2018-02-18: 150 mg via INTRAVENOUS
  Filled 2018-02-18: qty 10

## 2018-02-18 MED ORDER — DEXAMETHASONE SODIUM PHOSPHATE 10 MG/ML IJ SOLN
INTRAMUSCULAR | Status: AC
  Filled 2018-02-18: qty 1

## 2018-02-18 MED ORDER — FAMOTIDINE IN NACL 20-0.9 MG/50ML-% IV SOLN
20.0000 mg | Freq: Once | INTRAVENOUS | Status: AC
  Administered 2018-02-18: 20 mg via INTRAVENOUS

## 2018-02-18 MED ORDER — LEUCOVORIN CALCIUM INJECTION 350 MG
200.0000 mg/m2 | Freq: Once | INTRAVENOUS | Status: AC
  Administered 2018-02-18: 460 mg via INTRAVENOUS
  Filled 2018-02-18: qty 23

## 2018-02-18 MED ORDER — SODIUM CHLORIDE 0.9% FLUSH
10.0000 mL | INTRAVENOUS | Status: DC | PRN
  Administered 2018-02-18: 10 mL
  Filled 2018-02-18: qty 10

## 2018-02-18 NOTE — Progress Notes (Signed)
[place] OFFICE PROGRESS NOTE   Diagnosis: Rectal cancer  INTERVAL HISTORY:   Mr. Schreifels returns as scheduled.  He completed another cycle of FOLFOX 02/04/2018.  He reports flulike symptoms for several days following chemotherapy.  He now feels well.  He continues to have intermittent swelling in the scrotum and pubic areas.  He is having bowel movements.  He relates shoulder pain to arthritis.  Objective:  Vital signs in last 24 hours:  Blood pressure 110/76, pulse 77, temperature 97.8 F (36.6 C), temperature source Oral, resp. rate 20, height '6\' 3"'  (1.905 m), weight 200 lb 9.6 oz (91 kg), SpO2 100 %.    HEENT: No thrush or ulcers Lymphatics: No cervical or supraclavicular nodes.  Mobile 1 cm right axillary node and 0.5 cm left axillary node Resp: Lungs clear bilaterally Cardio: Regular rate and rhythm GI: No hepatosplenomegaly, nontender, no mass GU scrotal and penile edema Vascular: No leg edema  Skin: 2-3 cm raised cutaneous lesions at the right upper anterior chest and left lower neck.  Smaller lesion inferior to the left neck mass, 1 cm soft nodular cutaneous lesion at the right upper thigh/groin  Portacath/PICC-without erythema  Lab Results:  Lab Results  Component Value Date   WBC 4.1 02/18/2018   HGB 11.9 (L) 02/18/2018   HCT 35.8 (L) 02/18/2018   MCV 88.8 02/18/2018   PLT 108 (L) 02/18/2018   NEUTROABS 2.5 02/18/2018    CMP  Lab Results  Component Value Date   NA 139 02/18/2018   K 4.2 02/18/2018   CL 109 02/18/2018   CO2 26 02/18/2018   GLUCOSE 92 02/18/2018   BUN 13 02/18/2018   CREATININE 0.91 02/18/2018   CALCIUM 10.1 02/18/2018   PROT 7.0 02/18/2018   ALBUMIN 3.0 (L) 02/18/2018   AST 26 02/18/2018   ALT 14 02/18/2018   ALKPHOS 84 02/18/2018   BILITOT 0.5 02/18/2018   GFRNONAA >60 02/18/2018   GFRAA >60 02/18/2018    Lab Results  Component Value Date   CEA1 876.00 (H) 02/18/2018    Lab Results  Component Value Date     INR 1.04 08/14/2017    Imaging:  CT images from 02/14/2018 reviewed with Mr. Micke and his family Medications: I have reviewed the patient's current medications.   Assessment/Plan: 1. Rectal cancer  MRI lumbar spine 10/20/2016 with bilateral hydronephrosis and retroperitoneal adenopathy  CT abdomen/pelvis 10/20/2016 with extensive retroperitoneal process with marked interstitial thickening and lymphadenopathy; bilateral hydronephrosis; similar ill-defined soft tissue density and skin thickening around the umbilicus; diffuse bladder wall thickening slightly asymmetric at the bladder dome but no obvious bladder mass  Biopsy para-aortic lymph node 11/10/2016-metastatic adenocarcinoma consistent with GI primary  11/23/2016 CEA elevated, CA-19-9 normal  Colonoscopy 11/27/2016-fungating infiltrative nonobstructing mass in the distal rectum involving the anal verge. The rectum was fixed and frozen in the pelvis precluding passage of scope beyond the distal sigmoid colon. 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  Foundation 1-microsatellite stable; tumor mutational burden 5; NOTCH3amplification; NOTCH3rearrangement exon 14; [id number]  Upper endoscopy 11/27/2016-normal.  PET scan 06/22/2018showed minimal activity in the primary rectal carcinoma, diffuse wall thickening; mild activity associate with retroperitoneal lymph nodes; persistent haziness within the retroperitoneum. No evidence of liver metastasis or distant metastasis.  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  Cycle 2 FOLFOX 12/20/2016  Cycle 3 FOLFOX 01/03/2017 (oxaliplatin dose  reduced secondary to thrombocytopenia)   Cycle 4 FOLFOX 01/17/2017 (oxaliplatin held due to thrombocytopenia)  Cycle 5 FOLFOX  02/15/2017  CTs 02/26/2017-new sclerotic lesions in the spine and right iliac, no residual adenopathy, persistent rectal wall thickening  Cycle 6 FOLFOX 03/01/2017 (Neulasta added, oxaliplatin further dose reduced)   Cycle 7 FOLFOX 03/15/2017 (oxaliplatin held secondary to thrombocytopenia)  Cycle 8 FOLFOX 03/29/2017  Cycle 9 FOLFOX 04/12/2017 (oxaliplatin held secondary to thrombocytopenia and neuropathy)  Cycle 10 FOLFOX 04/25/2017 (oxaliplatin held secondary to neuropathy)  CTs 05/07/2017-stable rectal thickening, stable periaortic soft tissue, stable sclerotic lesions at the lumbar spine and pelvis  Treatment changed to maintenance capecitabine beginning 05/14/2017  CTs 08/03/2017-irregular annular wall thickening mid to lower rectum appears mildly increased; newly enlarged infiltrative retroperitoneal nodal metastasis; mild left supraclavicular adenopathy appears minimally increased; ill-defined umbilical soft tissue thickening stable; small scattered sclerotic bone lesions stable.  Cycle 1 FOLFIRI/Panitumumab 08/20/2017  Cycle 2 FOLFIRI/Panitumumab 09/10/2017 (irinotecan and 5-FU dose reduced due to mucositis)  Cycle 3 FOLFIRI/panitumumab 09/24/2017  Cycle 4 FOLFIRI/panitumumab 10/08/2017 (5-fluorouracil further dose reduced secondary to mucositis)  Cycle 5 FOLFIRI/Panitumumab 10/29/2017  CTs 11/09/2017-increase in mediastinal, retroperitoneal, and mesenteric adenopathy, increased wall thickening of the rectum, increased sclerotic lesions at the thoracic spine  Cycle 1 FOLFOX 12/06/2017  Cycle 2 FOLFOX 12/24/2017  Cycle 3 FOLFOX 01/08/2018  Cycle 4 FOLFOX 01/21/2018  Cycle 5 FOLFOX 02/04/2018  CTs 02/14/2018- improved tumor in the retroperitoneum and mesentery, mixed response in chest lymph nodes, increase conspicuity of sclerotic bone lesions, left-sided bladder wall enhancement and thickening, wall thickening at the lower rectum and anus  Cycle 6 FOLFOX  02/18/2018 2. Retroperitoneal lymphadenopathy secondary to #1 3. Bilateral hydronephrosis status post evaluation by urology 4. Asymmetry of the bladder dome on CT 10/20/2016 status post cystoscopy with biopsy planned 12/11/2016 (Dr. 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 Change in bowel habits, secondary to #1-improved. 6. Right leg edema, question due to lymphatic obstruction;venous Doppler negative for DVT 11/23/2016. 7. Port-A-Cath placement 12/05/2016 8. Thrombocytopenia secondary to chemotherapy; oxaliplatin dose reduced with cycle 3 FOLFOX. Progressive thrombocytopenia 01/17/2017; oxaliplatin held 01/17/2017. 9. Right lower extremity cellulitis 01/27/2017-blood culture positive for methicillin sensitive staph aureus; Port-A-Cath removed. PICC line placed. TEE 01/30/2017 without evidence of vegetation. 14 days of Ancef recommended after Port-A-Cath removal with end date 02/14/2017. 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 Mucositis following cycle 1 FOLFIRI/Panitumumab-cycle 2 delayed andirinotecan/5-FU dose reduced.Improved 12. Chills and generalized aches following cycles 2, 3, and 4 FOLFOX     Disposition: Mr. Petrosyan years unchanged.  The restaging CTs reveal overall stable disease.  There has been some improvement in the abdominal/retroperitoneal tumor burden, but he appears to be developing new skin lesions and some of the chest nodes are larger.  I discussed treatment options and reviewed the CT images with Mr. Fortunato and his family.  He will complete another cycle of FOLFOX today.  He has seen Dr. Reynaldo Minium in the past.  I will contact him to see if he has a trial for Mr. Antony.  We will also refer him to Dr. Aleatha Borer at [facility] to look at clinical trial eligibility there.  He has a vacation planned in 2 weeks.  Mr. Mcmann will return for an office visit in 3 weeks.  25 minutes were spent with  the patient today.  The majority of the time was used for counseling and coordination of care.  Betsy Coder, MD  02/18/2018  5:24 PM

## 2018-02-18 NOTE — Telephone Encounter (Signed)
Scheduled appt per 9/9 los - gave patient AVS and calender per los.   

## 2018-02-18 NOTE — Patient Instructions (Signed)
[facility] Discharge Instructions for Patients Receiving Chemotherapy  Today you received the following chemotherapy agents Oxaliplatin, Leucovorin, 5FU  To help prevent nausea and vomiting after your treatment, we encourage you to take your nausea medication as directed   If you develop nausea and vomiting that is not controlled by your nausea medication, call the clinic.   BELOW ARE SYMPTOMS THAT SHOULD BE REPORTED IMMEDIATELY:  *FEVER GREATER THAN 100.5 F  *CHILLS WITH OR WITHOUT FEVER  NAUSEA AND VOMITING THAT IS NOT CONTROLLED WITH YOUR NAUSEA MEDICATION  *UNUSUAL SHORTNESS OF BREATH  *UNUSUAL BRUISING OR BLEEDING  TENDERNESS IN MOUTH AND THROAT WITH OR WITHOUT PRESENCE OF ULCERS  *URINARY PROBLEMS  *BOWEL PROBLEMS  UNUSUAL RASH Items with * indicate a potential emergency and should be followed up as soon as possible.  Feel free to call the clinic should you have any questions or concerns. The clinic phone number is [phone].  Please show the CHEMO ALERT CARD at check-in to the Emergency Department and triage nurse.   

## 2018-02-19 ENCOUNTER — Telehealth: Payer: Self-pay | Admitting: Oncology

## 2018-02-19 NOTE — Telephone Encounter (Signed)
Faxed records to Dr. Aleatha Borer.

## 2018-02-20 ENCOUNTER — Inpatient Hospital Stay: Payer: Medicare Other

## 2018-02-20 VITALS — BP 110/76 | HR 73 | Temp 98.7°F | Resp 19

## 2018-02-20 DIAGNOSIS — C20 Malignant neoplasm of rectum: Secondary | ICD-10-CM

## 2018-02-20 DIAGNOSIS — Z5111 Encounter for antineoplastic chemotherapy: Secondary | ICD-10-CM | POA: Diagnosis not present

## 2018-02-20 MED ORDER — HEPARIN SOD (PORK) LOCK FLUSH 100 UNIT/ML IV SOLN
500.0000 [IU] | Freq: Once | INTRAVENOUS | Status: AC | PRN
  Administered 2018-02-20: 500 [IU]
  Filled 2018-02-20: qty 5

## 2018-02-20 MED ORDER — SODIUM CHLORIDE 0.9% FLUSH
10.0000 mL | INTRAVENOUS | Status: DC | PRN
  Administered 2018-02-20: 10 mL
  Filled 2018-02-20: qty 10

## 2018-02-21 ENCOUNTER — Telehealth: Payer: Self-pay | Admitting: Oncology

## 2018-02-21 NOTE — Telephone Encounter (Signed)
Pt. appt with [facility] Dr. Aleatha Borer is 03/11/18 @ 10:00. Pt is aware

## 2018-02-28 ENCOUNTER — Telehealth: Payer: Self-pay | Admitting: Oncology

## 2018-02-28 NOTE — Telephone Encounter (Signed)
Appointments complete per 9/13 schedule message. Spoke with patient re October appointments.

## 2018-03-01 ENCOUNTER — Telehealth: Payer: Self-pay | Admitting: Oncology

## 2018-03-01 NOTE — Telephone Encounter (Signed)
Per desk nurse (staff message also) cxd 10/8 and 10/10 appointments rescheduled for 9/30 and just schedule patient for f/u with [facility] the week on October 1st. Spoke with patient and gave him f/u appointment for 10/2. Patient aware all other appointments cxd.

## 2018-03-11 ENCOUNTER — Ambulatory Visit: Payer: Medicare Other

## 2018-03-11 ENCOUNTER — Other Ambulatory Visit: Payer: Medicare Other

## 2018-03-11 ENCOUNTER — Ambulatory Visit: Payer: Medicare Other | Admitting: Nurse Practitioner

## 2018-03-13 ENCOUNTER — Inpatient Hospital Stay: Payer: Medicare Other | Attending: Oncology | Admitting: Oncology

## 2018-03-13 ENCOUNTER — Ambulatory Visit (HOSPITAL_COMMUNITY)
Admission: RE | Admit: 2018-03-13 | Discharge: 2018-03-13 | Disposition: A | Discharge status: Home / Self Care | Payer: Medicare Other | Source: Ambulatory Visit | Attending: Oncology | Admitting: Oncology

## 2018-03-13 ENCOUNTER — Telehealth: Payer: Self-pay | Admitting: Oncology

## 2018-03-13 VITALS — BP 128/78 | HR 90 | Temp 97.7°F | Resp 18 | Ht 75.0 in | Wt 201.8 lb

## 2018-03-13 DIAGNOSIS — R609 Edema, unspecified: Secondary | ICD-10-CM | POA: Insufficient documentation

## 2018-03-13 DIAGNOSIS — C20 Malignant neoplasm of rectum: Secondary | ICD-10-CM

## 2018-03-13 DIAGNOSIS — C786 Secondary malignant neoplasm of retroperitoneum and peritoneum: Secondary | ICD-10-CM | POA: Diagnosis not present

## 2018-03-13 DIAGNOSIS — R970 Elevated carcinoembryonic antigen [CEA]: Secondary | ICD-10-CM | POA: Diagnosis not present

## 2018-03-13 DIAGNOSIS — M7989 Other specified soft tissue disorders: Secondary | ICD-10-CM | POA: Diagnosis present

## 2018-03-13 DIAGNOSIS — C674 Malignant neoplasm of posterior wall of bladder: Secondary | ICD-10-CM | POA: Diagnosis not present

## 2018-03-13 DIAGNOSIS — R911 Solitary pulmonary nodule: Secondary | ICD-10-CM

## 2018-03-13 DIAGNOSIS — C7989 Secondary malignant neoplasm of other specified sites: Secondary | ICD-10-CM | POA: Diagnosis not present

## 2018-03-13 NOTE — Progress Notes (Signed)
Right lower extremity venous duplex completed - Preliminary results - There is no evidence of a DVT or Baker's cyst. There is however an atypical color flow pattern consistent with a possible more proximal compression. Enlargement of the inguinal lymph nodes noted. [place] Jacqlyn Marolf,RVS 10?07/2017 10:17 AM

## 2018-03-13 NOTE — Progress Notes (Signed)
Nicholas Suarez   Diagnosis: Rectal cancer  INTERVAL HISTORY:   Nicholas Suarez returns as scheduled.  He saw Dr. Aleatha Borer on 03/11/2018.  He is being evaluated for enrollment on a clinical trial targeting a FGFR2 rearrangement. He notes increased malaise.  He has noted increased edema in the scrotum and suprapubic region as well as the right leg.  Objective:  Vital signs in last 24 hours:  Blood pressure 128/78, pulse 90, temperature 97.7 F (36.5 C), temperature source Oral, resp. rate 18, height '6\' 3"'  (1.905 m), weight 201 lb 12.8 oz (91.5 kg), SpO2 97 %.    HEENT: Neck without mass Resp: Lungs clear bilaterally Cardio: Regular rate and rhythm GI: No hepatomegaly, no mass Vascular: 1+ edema throughout the right leg  Skin: Erythematous slightly raised lesions at the right upper anterior chest and left lower neck, edema throughout the suprapubic area and scrotum  Portacath/PICC-without erythema   Lab Results:  Lab Results  Component Value Date   WBC 4.1 02/18/2018   HGB 11.9 (L) 02/18/2018   HCT 35.8 (L) 02/18/2018   MCV 88.8 02/18/2018   PLT 108 (L) 02/18/2018   NEUTROABS 2.5 02/18/2018    CMP  Lab Results  Component Value Date   NA 139 02/18/2018   K 4.2 02/18/2018   CL 109 02/18/2018   CO2 26 02/18/2018   GLUCOSE 92 02/18/2018   BUN 13 02/18/2018   CREATININE 0.91 02/18/2018   CALCIUM 10.1 02/18/2018   PROT 7.0 02/18/2018   ALBUMIN 3.0 (L) 02/18/2018   AST 26 02/18/2018   ALT 14 02/18/2018   ALKPHOS 84 02/18/2018   BILITOT 0.5 02/18/2018   GFRNONAA >60 02/18/2018   GFRAA >60 02/18/2018    Lab Results  Component Value Date   CEA1 876.00 (H) 02/18/2018     Medications: I have reviewed the patient's current medications.   Assessment/Plan: 1. Rectal cancer  MRI lumbar spine 10/20/2016 with bilateral hydronephrosis and retroperitoneal adenopathy  CT abdomen/pelvis 10/20/2016 with extensive retroperitoneal process with  marked interstitial thickening and lymphadenopathy; bilateral hydronephrosis; similar ill-defined soft tissue density and skin thickening around the umbilicus; diffuse bladder wall thickening slightly asymmetric at the bladder dome but no obvious bladder mass  Biopsy para-aortic lymph node 11/10/2016-metastatic adenocarcinoma consistent with GI primary  11/23/2016 CEA elevated, CA-19-9 normal  Colonoscopy 11/27/2016-fungating infiltrative nonobstructing mass in the distal rectum involving the anal verge. The rectum was fixed and frozen in the pelvis precluding passage of scope beyond the distal sigmoid colon. 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  Foundation 1-microsatellite stable; tumor mutational burden 5; NOTCH3amplification; NOTCH3rearrangement exon 14; [id number] FGFR2 rearrangement  Upper endoscopy 11/27/2016-normal.  PET scan 06/22/2018showed minimal activity in the primary rectal carcinoma, diffuse wall thickening; mild activity associate with retroperitoneal lymph nodes; persistent haziness within the retroperitoneum. No evidence of liver metastasis or distant metastasis.  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  Cycle 2 FOLFOX 12/20/2016  Cycle 3 FOLFOX 01/03/2017 (oxaliplatin dose reduced secondary to thrombocytopenia)   Cycle 4 FOLFOX 01/17/2017 (oxaliplatin held due to thrombocytopenia)  Cycle 5 FOLFOX 02/15/2017  CTs 02/26/2017-new sclerotic lesions in the spine and right iliac, no residual adenopathy, persistent rectal wall thickening  Cycle 6 FOLFOX 03/01/2017 (Neulasta added, oxaliplatin further dose reduced)   Cycle 7 FOLFOX 03/15/2017 (oxaliplatin held secondary to thrombocytopenia)  Cycle 8 FOLFOX 03/29/2017  Cycle 9 FOLFOX 04/12/2017 (oxaliplatin  held secondary to thrombocytopenia and neuropathy)  Cycle 10 FOLFOX 04/25/2017 (oxaliplatin held secondary to neuropathy)  CTs 05/07/2017-stable rectal thickening, stable periaortic soft tissue, stable sclerotic lesions at the lumbar spine and pelvis  Treatment changed to maintenance capecitabine beginning 05/14/2017  CTs 08/03/2017-irregular annular wall thickening mid to lower rectum appears mildly increased; newly enlarged infiltrative retroperitoneal nodal metastasis; mild left supraclavicular adenopathy appears minimally increased; ill-defined umbilical soft tissue thickening stable; small scattered sclerotic bone lesions stable.  Cycle 1 FOLFIRI/Panitumumab 08/20/2017  Cycle 2 FOLFIRI/Panitumumab 09/10/2017 (irinotecan and 5-FU dose reduced due to mucositis)  Cycle 3 FOLFIRI/panitumumab 09/24/2017  Cycle 4 FOLFIRI/panitumumab 10/08/2017 (5-fluorouracil further dose reduced secondary to mucositis)  Cycle 5 FOLFIRI/Panitumumab 10/29/2017  CTs 11/09/2017-increase in mediastinal, retroperitoneal, and mesenteric adenopathy, increased wall thickening of the rectum, increased sclerotic lesions at the thoracic spine  Cycle 1 FOLFOX 12/06/2017  Cycle 2 FOLFOX 12/24/2017  Cycle 3 FOLFOX 01/08/2018  Cycle 4 FOLFOX 01/21/2018  Cycle 5 FOLFOX 02/04/2018  CTs 02/14/2018- improved tumor in the retroperitoneum and mesentery, mixed response in chest lymph nodes, increase conspicuity of sclerotic bone lesions, left-sided bladder wall enhancement and thickening, wall thickening at the lower rectum and anus  Cycle 6 FOLFOX 02/18/2018 2. Retroperitoneal lymphadenopathy secondary to #1 3. Bilateral hydronephrosis status post evaluation by urology 4. Asymmetry of the bladder dome on CT 10/20/2016 status post cystoscopy with biopsy planned 12/11/2016 (Dr. 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 Change in bowel habits,  secondary to #1-improved. 6. Right leg edema, question due to lymphatic obstruction;venous Doppler negative for DVT 11/23/2016. 7. Port-A-Cath placement 12/05/2016 8. Thrombocytopenia secondary to chemotherapy; oxaliplatin dose reduced with cycle 3 FOLFOX. Progressive thrombocytopenia 01/17/2017; oxaliplatin held 01/17/2017. 9. Right lower extremity cellulitis 01/27/2017-blood culture positive for methicillin sensitive staph aureus; Port-A-Cath removed. PICC line placed. TEE 01/30/2017 without evidence of vegetation. 14 days of Ancef recommended after Port-A-Cath removal with end date 02/14/2017. 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 Mucositis following cycle 1 FOLFIRI/Panitumumab-cycle 2 delayed andirinotecan/5-FU dose reduced.Improved 12. 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  There is clinical evidence of disease progression.  He saw Dr. Aleatha Borer earlier this week.  He is being considered for enrollment on a targeted therapy trial with a FGFR inhibitor.  He says that he will find out if he is eligible for the clinical trial later this week.  Outside of a clinical trial we will consider treatment with Lonsurf.  He will be followed closely at [facility] if he enrolled on the clinical trial.  He will return for an office visit here in approximately 4 weeks.  The scrotal/suprapubic and right leg edema are likely related to lymphatic obstruction from pelvic/retroperitoneal tumor.  We will refer him for another Doppler to rule out a lower extremity deep vein thrombosis.  He will obtain an influenza vaccine via his primary physician.  Betsy Coder, MD  03/13/2018  8:48 AM

## 2018-03-13 NOTE — Telephone Encounter (Signed)
Appts scheduled calendar printed/ [place] was scheduled per 10/2 los

## 2018-03-18 ENCOUNTER — Telehealth: Payer: Self-pay | Admitting: Oncology

## 2018-03-18 NOTE — Telephone Encounter (Signed)
tried to reach regarding time change

## 2018-03-19 ENCOUNTER — Other Ambulatory Visit: Payer: Medicare Other

## 2018-03-19 ENCOUNTER — Ambulatory Visit: Payer: Medicare Other

## 2018-03-19 ENCOUNTER — Ambulatory Visit: Payer: Medicare Other | Admitting: Nurse Practitioner

## 2018-03-21 ENCOUNTER — Other Ambulatory Visit: Payer: Medicare Other

## 2018-03-22 ENCOUNTER — Telehealth: Payer: Self-pay | Admitting: Emergency Medicine

## 2018-03-22 NOTE — Telephone Encounter (Signed)
Patient called to inform Dr.Sherrill that he is having his colostomy done @ [facility] on Monday 10/14. MD made aware.

## 2018-03-25 ENCOUNTER — Telehealth: Payer: Self-pay | Admitting: Emergency Medicine

## 2018-03-25 NOTE — Telephone Encounter (Signed)
-----   Message from Ladell Pier, MD sent at 03/22/2018  8:00 PM EDT ----- Regarding: RE: Colostomy Okay, I reviewed the notes from [facility], follow-up here as scheduled and sooner as needed ----- Message ----- From: Hughie Closs, LPN Sent: [id number]   4:04 PM EDT To: Ladell Pier, MD Subject: Colostomy                                      Patient just wanted you to be aware he is having his Colostomy done on Monday 10/14.

## 2018-03-28 ENCOUNTER — Ambulatory Visit: Payer: Medicare Other | Admitting: Internal Medicine

## 2018-03-28 MED ORDER — ACETAMINOPHEN 500 MG PO TABS
1000.00 | ORAL_TABLET | ORAL | Status: DC
Start: 2018-04-01 — End: 2018-03-28

## 2018-03-28 MED ORDER — PANTOPRAZOLE SODIUM 40 MG PO TBEC
40.00 | DELAYED_RELEASE_TABLET | ORAL | Status: DC
Start: 2018-04-02 — End: 2018-03-28

## 2018-03-28 MED ORDER — TRAMADOL HCL 50 MG PO TABS
50.00 | ORAL_TABLET | ORAL | Status: DC
Start: ? — End: 2018-03-28

## 2018-03-28 MED ORDER — HEPARIN SODIUM (PORCINE) 5000 UNIT/ML IJ SOLN
5000.00 | INTRAMUSCULAR | Status: DC
Start: 2018-04-01 — End: 2018-03-28

## 2018-03-28 MED ORDER — OXYCODONE HCL 5 MG PO TABS
5.00 | ORAL_TABLET | ORAL | Status: DC
Start: ? — End: 2018-03-28

## 2018-03-28 MED ORDER — LIDOCAINE 5 % EX PTCH
2.00 | MEDICATED_PATCH | CUTANEOUS | Status: DC
Start: 2018-04-02 — End: 2018-03-28

## 2018-03-28 MED ORDER — HYDROMORPHONE HCL 1 MG/ML IJ SOLN
0.50 | INTRAMUSCULAR | Status: DC
Start: ? — End: 2018-03-28

## 2018-03-28 MED ORDER — INFLUENZA VAC SPLIT QUAD 0.5 ML IM SUSY
0.50 | PREFILLED_SYRINGE | INTRAMUSCULAR | Status: DC
Start: ? — End: 2018-03-28

## 2018-04-01 MED ORDER — POLYETHYLENE GLYCOL 3350 17 G PO PACK
17.00 | PACK | ORAL | Status: DC
Start: 2018-04-02 — End: 2018-04-01

## 2018-04-01 MED ORDER — ONDANSETRON HCL 4 MG/2ML IJ SOLN
4.00 | INTRAMUSCULAR | Status: DC
Start: ? — End: 2018-04-01

## 2018-04-01 MED ORDER — SIMETHICONE 80 MG PO CHEW
80.00 | CHEWABLE_TABLET | ORAL | Status: DC
Start: ? — End: 2018-04-01

## 2018-04-04 ENCOUNTER — Emergency Department (HOSPITAL_COMMUNITY): Payer: Medicare Other

## 2018-04-04 ENCOUNTER — Telehealth: Payer: Self-pay | Admitting: *Deleted

## 2018-04-04 ENCOUNTER — Encounter (HOSPITAL_COMMUNITY): Payer: Self-pay | Admitting: *Deleted

## 2018-04-04 ENCOUNTER — Inpatient Hospital Stay (HOSPITAL_COMMUNITY)
Admission: EM | Admit: 2018-04-04 | Discharge: 2018-04-08 | DRG: 948 | Disposition: A | Discharge status: Home / Self Care | Payer: Medicare Other | Attending: Internal Medicine | Admitting: Internal Medicine

## 2018-04-04 DIAGNOSIS — E8770 Fluid overload, unspecified: Secondary | ICD-10-CM | POA: Diagnosis present

## 2018-04-04 DIAGNOSIS — Z79891 Long term (current) use of opiate analgesic: Secondary | ICD-10-CM

## 2018-04-04 DIAGNOSIS — E785 Hyperlipidemia, unspecified: Secondary | ICD-10-CM | POA: Diagnosis present

## 2018-04-04 DIAGNOSIS — J9811 Atelectasis: Secondary | ICD-10-CM | POA: Diagnosis present

## 2018-04-04 DIAGNOSIS — Z888 Allergy status to other drugs, medicaments and biological substances status: Secondary | ICD-10-CM

## 2018-04-04 DIAGNOSIS — C786 Secondary malignant neoplasm of retroperitoneum and peritoneum: Secondary | ICD-10-CM | POA: Diagnosis not present

## 2018-04-04 DIAGNOSIS — J90 Pleural effusion, not elsewhere classified: Secondary | ICD-10-CM | POA: Diagnosis present

## 2018-04-04 DIAGNOSIS — Z803 Family history of malignant neoplasm of breast: Secondary | ICD-10-CM | POA: Diagnosis not present

## 2018-04-04 DIAGNOSIS — R601 Generalized edema: Secondary | ICD-10-CM | POA: Diagnosis present

## 2018-04-04 DIAGNOSIS — Z87891 Personal history of nicotine dependence: Secondary | ICD-10-CM | POA: Diagnosis not present

## 2018-04-04 DIAGNOSIS — K761 Chronic passive congestion of liver: Secondary | ICD-10-CM | POA: Diagnosis present

## 2018-04-04 DIAGNOSIS — Z79899 Other long term (current) drug therapy: Secondary | ICD-10-CM

## 2018-04-04 DIAGNOSIS — R0602 Shortness of breath: Secondary | ICD-10-CM | POA: Diagnosis not present

## 2018-04-04 DIAGNOSIS — Z933 Colostomy status: Secondary | ICD-10-CM

## 2018-04-04 DIAGNOSIS — C20 Malignant neoplasm of rectum: Secondary | ICD-10-CM | POA: Diagnosis present

## 2018-04-04 DIAGNOSIS — R0902 Hypoxemia: Secondary | ICD-10-CM | POA: Diagnosis present

## 2018-04-04 DIAGNOSIS — Z9221 Personal history of antineoplastic chemotherapy: Secondary | ICD-10-CM

## 2018-04-04 DIAGNOSIS — R945 Abnormal results of liver function studies: Secondary | ICD-10-CM

## 2018-04-04 DIAGNOSIS — C674 Malignant neoplasm of posterior wall of bladder: Secondary | ICD-10-CM | POA: Diagnosis not present

## 2018-04-04 DIAGNOSIS — R7989 Other specified abnormal findings of blood chemistry: Secondary | ICD-10-CM

## 2018-04-04 DIAGNOSIS — C7989 Secondary malignant neoplasm of other specified sites: Secondary | ICD-10-CM | POA: Diagnosis not present

## 2018-04-04 DIAGNOSIS — C772 Secondary and unspecified malignant neoplasm of intra-abdominal lymph nodes: Secondary | ICD-10-CM | POA: Diagnosis present

## 2018-04-04 DIAGNOSIS — R609 Edema, unspecified: Secondary | ICD-10-CM | POA: Diagnosis not present

## 2018-04-04 DIAGNOSIS — R970 Elevated carcinoembryonic antigen [CEA]: Secondary | ICD-10-CM | POA: Diagnosis not present

## 2018-04-04 DIAGNOSIS — J309 Allergic rhinitis, unspecified: Secondary | ICD-10-CM | POA: Diagnosis present

## 2018-04-04 LAB — CBC
HEMATOCRIT: 26.9 % — AB (ref 39.0–52.0)
HEMOGLOBIN: 8.5 g/dL — AB (ref 13.0–17.0)
MCH: 30.7 pg (ref 26.0–34.0)
MCHC: 31.6 g/dL (ref 30.0–36.0)
MCV: 97.1 fL (ref 80.0–100.0)
NRBC: 0 % (ref 0.0–0.2)
PLATELETS: 179 10*3/uL (ref 150–400)
RBC: 2.77 MIL/uL — AB (ref 4.22–5.81)
RDW: 16.3 % — AB (ref 11.5–15.5)
WBC: 6.4 10*3/uL (ref 4.0–10.5)

## 2018-04-04 LAB — URINALYSIS, ROUTINE W REFLEX MICROSCOPIC
BILIRUBIN URINE: NEGATIVE
GLUCOSE, UA: NEGATIVE mg/dL
Hgb urine dipstick: NEGATIVE
Ketones, ur: NEGATIVE mg/dL
LEUKOCYTES UA: NEGATIVE
Nitrite: NEGATIVE
Protein, ur: 30 mg/dL — AB
SPECIFIC GRAVITY, URINE: 1.026 (ref 1.005–1.030)
pH: 5 (ref 5.0–8.0)

## 2018-04-04 LAB — CREATININE, SERUM: Creatinine, Ser: 1.1 mg/dL (ref 0.61–1.24)

## 2018-04-04 LAB — COMPREHENSIVE METABOLIC PANEL
ALK PHOS: 470 U/L — AB (ref 38–126)
ALT: 69 U/L — ABNORMAL HIGH (ref 0–44)
ANION GAP: 8 (ref 5–15)
AST: 102 U/L — ABNORMAL HIGH (ref 15–41)
Albumin: 2.4 g/dL — ABNORMAL LOW (ref 3.5–5.0)
BILIRUBIN TOTAL: 1.7 mg/dL — AB (ref 0.3–1.2)
BUN: 18 mg/dL (ref 8–23)
CALCIUM: 8.9 mg/dL (ref 8.9–10.3)
CO2: 24 mmol/L (ref 22–32)
Chloride: 103 mmol/L (ref 98–111)
Creatinine, Ser: 1.05 mg/dL (ref 0.61–1.24)
GFR calc Af Amer: >60 mL/min (ref >60)
GLUCOSE: 112 mg/dL — AB (ref 70–99)
POTASSIUM: 4.3 mmol/L (ref 3.5–5.1)
Sodium: 135 mmol/L (ref 135–145)
TOTAL PROTEIN: 6.2 g/dL — AB (ref 6.5–8.1)

## 2018-04-04 LAB — I-STAT TROPONIN, ED: TROPONIN I, POC: 0 ng/mL (ref 0.00–0.08)

## 2018-04-04 LAB — BRAIN NATRIURETIC PEPTIDE: B Natriuretic Peptide: 14.6 pg/mL (ref 0.0–100.0)

## 2018-04-04 LAB — MAGNESIUM: MAGNESIUM: 2.2 mg/dL (ref 1.7–2.4)

## 2018-04-04 MED ORDER — SODIUM CHLORIDE 0.9 % IV SOLN
2.0000 g | Freq: Once | INTRAVENOUS | Status: AC
  Administered 2018-04-04: 2 g via INTRAVENOUS
  Filled 2018-04-04: qty 2

## 2018-04-04 MED ORDER — SODIUM CHLORIDE 0.9 % IJ SOLN
INTRAMUSCULAR | Status: AC
  Filled 2018-04-04: qty 50

## 2018-04-04 MED ORDER — ONDANSETRON HCL 4 MG/2ML IJ SOLN
4.0000 mg | Freq: Four times a day (QID) | INTRAMUSCULAR | Status: DC | PRN
  Administered 2018-04-06: 4 mg via INTRAVENOUS
  Filled 2018-04-04: qty 2

## 2018-04-04 MED ORDER — ONDANSETRON HCL 4 MG PO TABS: 4.0000 mg | ORAL_TABLET | Freq: Four times a day (QID) | ORAL | Status: DC | PRN

## 2018-04-04 MED ORDER — TRAMADOL HCL 50 MG PO TABS
50.0000 mg | ORAL_TABLET | Freq: Once | ORAL | Status: AC
  Administered 2018-04-04: 50 mg via ORAL
  Filled 2018-04-04: qty 1

## 2018-04-04 MED ORDER — IOPAMIDOL (ISOVUE-370) INJECTION 76%
100.0000 mL | Freq: Once | INTRAVENOUS | Status: AC | PRN
  Administered 2018-04-04: 100 mL via INTRAVENOUS

## 2018-04-04 MED ORDER — SODIUM CHLORIDE 0.9 % IV SOLN
1.0000 g | Freq: Three times a day (TID) | INTRAVENOUS | Status: DC
  Administered 2018-04-05 – 2018-04-06 (×4): 1 g via INTRAVENOUS
  Filled 2018-04-04 (×6): qty 1

## 2018-04-04 MED ORDER — SODIUM CHLORIDE 0.9 % IV SOLN
250.0000 mL | INTRAVENOUS | Status: DC | PRN
  Administered 2018-04-05 (×4): 250 mL via INTRAVENOUS

## 2018-04-04 MED ORDER — SODIUM CHLORIDE 0.9% FLUSH
3.0000 mL | Freq: Two times a day (BID) | INTRAVENOUS | Status: DC
  Administered 2018-04-06: 10 mL via INTRAVENOUS
  Administered 2018-04-06 – 2018-04-07 (×3): 3 mL via INTRAVENOUS

## 2018-04-04 MED ORDER — FUROSEMIDE 10 MG/ML IJ SOLN
20.0000 mg | Freq: Once | INTRAMUSCULAR | Status: AC
  Administered 2018-04-04: 20 mg via INTRAVENOUS
  Filled 2018-04-04: qty 4

## 2018-04-04 MED ORDER — SODIUM CHLORIDE 0.9% FLUSH: 3.0000 mL | INTRAVENOUS | Status: DC | PRN

## 2018-04-04 MED ORDER — ENOXAPARIN SODIUM 40 MG/0.4ML ~~LOC~~ SOLN
40.0000 mg | SUBCUTANEOUS | Status: DC
  Administered 2018-04-04 – 2018-04-07 (×4): 40 mg via SUBCUTANEOUS
  Filled 2018-04-04 (×4): qty 0.4

## 2018-04-04 MED ORDER — TRAMADOL HCL 50 MG PO TABS
50.0000 mg | ORAL_TABLET | Freq: Four times a day (QID) | ORAL | Status: DC | PRN
  Administered 2018-04-05 – 2018-04-07 (×7): 50 mg via ORAL
  Filled 2018-04-04 (×7): qty 1

## 2018-04-04 MED ORDER — OXYCODONE HCL 5 MG PO TABS: 5.0000 mg | ORAL_TABLET | Freq: Once | ORAL | Status: DC

## 2018-04-04 MED ORDER — IOPAMIDOL (ISOVUE-370) INJECTION 76%
INTRAVENOUS | Status: AC
  Filled 2018-04-04: qty 100

## 2018-04-04 MED ORDER — VANCOMYCIN HCL IN DEXTROSE 1-5 GM/200ML-% IV SOLN
1000.0000 mg | Freq: Two times a day (BID) | INTRAVENOUS | Status: DC
  Administered 2018-04-05: 1000 mg via INTRAVENOUS
  Filled 2018-04-04: qty 200

## 2018-04-04 MED ORDER — FUROSEMIDE 10 MG/ML IJ SOLN
20.0000 mg | Freq: Two times a day (BID) | INTRAMUSCULAR | Status: DC
  Administered 2018-04-04: 20 mg via INTRAVENOUS
  Filled 2018-04-04: qty 2

## 2018-04-04 MED ORDER — VANCOMYCIN HCL 10 G IV SOLR
2000.0000 mg | INTRAVENOUS | Status: AC
  Administered 2018-04-04: 2000 mg via INTRAVENOUS
  Filled 2018-04-04: qty 2000

## 2018-04-04 NOTE — Telephone Encounter (Signed)
Received voicemail from patient- had colonoscopy at [facility] last week. Developed shortness of breath, chest pain and swelling. He wanted to let Dr. Benay Spice know he is on his way to the ED at [facility].

## 2018-04-04 NOTE — H&P (Addendum)
TRH H&P    Patient Demographics:    Nicholas Suarez, is a 67 y.o. male  MRN: [id number]  DOB - 1950-11-27  Admit Date - 04/04/2018  Referring MD/NP/PA: Joline Maxcy  Outpatient Primary MD for the patient is Janith Lima, MD  Patient coming from: Home  Chief complaint-shortness of breath   HPI:    Nicholas Suarez  is a 67 y.o. male, with history of stage IV rectal adenocarcinoma status post chemotherapy with FOLFOX, there was clinical evidence of disease progression so he was being considered for enrollment on a targeted therapy trial with FGFR inhibitor at [facility].  Patient had not yet started the trial and became constipated, he was seen at [facility] and underwent diverting loop transverse colostomy and omentectomy on 03/25/2018.  Patient was discharged home , and since patient came back home he has noted worsening swelling of lower extremities and shortness of breath. Patient also has lost appetite and not been eating well. He denies nausea vomiting No chest pain No blurred vision, did not pass out. Complains of abdominal pain and being bloated Denies dysuria Denies fever or chills.  In the ED,CT abdomen pelvis and CT angios chest was done.  Negative for pulmonary embolism, showed bilateral pleural effusion with overlying atelectasis.  Mild body wall edema/anasarca small volume abdominal/pelvic ascites.  Fairly marked periportal edema in the liver. Patient started on empiric antibiotics vancomycin, cefepime and given 1 dose of IV Lasix 20 mg     Review of systems:    In addition to the HPI above,    All other systems reviewed and are negative.   With Past History of the following :    Past Medical History:  Diagnosis Date  . Allergic rhinitis, seasonal   . Anemia   . Bilateral hydrocele   . Bilateral hydronephrosis   . Chronic constipation   . Edema of right lower extremity     . 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 Wears glasses       Past Surgical History:  Procedure Laterality Date  . COLONOSCOPY WITH ESOPHAGOGASTRODUODENOSCOPY (EGD)  11-27-2016  dr Earlean Shawl   rectal mass  . CYSTOSCOPY WITH BIOPSY N/A 12/11/2016   Procedure: CYSTOSCOPY WITH BIOPSY;  Surgeon: Kathie Rhodes, MD;  Location: [facility];  Service: Urology;  Laterality: N/A;  . IR FLUORO GUIDE PORT INSERTION RIGHT  12/05/2016  . IR FLUORO GUIDE PORT INSERTION RIGHT  08/14/2017  . IR REMOVAL TUN ACCESS W/ PORT W/O FL MOD SED  01/31/2017  . IR US GUIDE VASC ACCESS RIGHT  12/05/2016  . IR US GUIDE VASC ACCESS RIGHT  08/14/2017  . TEE WITHOUT CARDIOVERSION N/A 01/30/2017   Procedure: TRANSESOPHAGEAL ECHOCARDIOGRAM (TEE);  Surgeon: Acie Fredrickson Wonda Cheng, MD;  Location: [place];  Service: Cardiovascular;  Laterality: N/A;  . UMBILICAL HERNIA REPAIR  05/2016      Social History:      Social History  Tobacco Use  . Smoking status: Former Smoker    Packs/day: 1.00    Years: 10.00    Pack years: 10.00    Types: Cigarettes, Pipe, Cigars    Last attempt to quit: 06/12/1988    Years since quitting: 29.8  . Smokeless tobacco: Former Systems developer    Types: Chew    Quit date: 06/12/1993  Substance Use Topics  . Alcohol use: No       Family History :     Family History  Problem Relation Age of Onset  . Breast cancer Mother   . Alcohol abuse Father   . Heart disease Other   . Cancer Other        Breast Cancer  . Arthritis Other   . Alcohol abuse Other   . Drug abuse Other   . Stroke Neg Hx   . Kidney disease Neg Hx   . Hypertension Neg Hx   . Hyperlipidemia Neg Hx   . Early death Neg Hx   . Colon cancer Neg Hx   . Esophageal cancer Neg Hx   . Rectal cancer Neg Hx   . Stomach cancer Neg Hx       Home Medications:   Prior to Admission medications   Medication Sig  Start Date End Date Taking? Authorizing Provider  Polyethylene Glycol 3350 (MIRALAX PO) Take 17 g by mouth daily as needed (constipation).    Yes [provider]  traMADol (ULTRAM) 50 MG tablet Take 1 tablet (50 mg total) by mouth every 6 (six) hours as needed for moderate pain. 12/03/17  Yes Ladell Pier, MD  meloxicam (MOBIC) 15 MG tablet Take 1 tablet (15 mg total) by mouth daily. Patient not taking: Reported on 04/04/2018 06/11/17   Janith Lima, MD  ondansetron (ZOFRAN) 4 MG tablet Take 4 mg by mouth every 8 (eight) hours as needed for nausea or vomiting.  04/01/18   [provider]  prochlorperazine (COMPAZINE) 10 MG tablet Take 1 tablet (10 mg total) by mouth every 6 (six) hours as needed for nausea or vomiting. Patient not taking: Reported on 04/04/2018 11/30/16   [name], NP     Allergies:     Allergies  Allergen Reactions  . Oxaliplatin Rash     Physical Exam:   Vitals  Blood pressure 104/76, pulse (!) 108, temperature 97.6 F (36.4 C), temperature source Oral, resp. rate 11, height '6\' 3"'  (1.905 m), weight 90.7 kg, SpO2 100 %.  1.  General: Appears in no acute distress  2. Psychiatric:  Intact judgement and  insight, awake alert, oriented x 3.  3. Neurologic: No focal neurological deficits, all cranial nerves intact.Strength 5/5 all 4 extremities, sensation intact all 4 extremities, plantars down going.  4. Eyes :  anicteric sclerae, moist conjunctivae with no lid lag. PERRLA.  5. ENMT:  Oropharynx clear with moist mucous membranes and good dentition  6. Neck:  supple, no cervical lymphadenopathy appriciated, No thyromegaly  7. Respiratory : Normal respiratory effort, decreased breath sounds at lung bases  8. Cardiovascular : RRR, no gallops, rubs or murmurs, bilateral 2+ pitting edema of the lower extremities  9. Gastrointestinal:  Positive bowel sounds, abdomen soft, mild generalized tenderness to palpation, colostomy in  place ,no hepatosplenomegaly, no rigidity or guarding       10. Skin:  No cyanosis, normal texture and turgor, no rash, lesions or ulcers  11.Musculoskeletal:  Good muscle tone,  joints appear normal , no effusions,  normal  range of motion    Data Review:    CBC Recent Labs  Lab 04/04/18 1526  WBC 6.4  HGB 8.5*  HCT 26.9*  PLT 179  MCV 97.1  MCH 30.7  MCHC 31.6  RDW 16.3*   ------------------------------------------------------------------------------------------------------------------  Results for orders placed or performed during the hospital encounter of 04/04/18 (from the past 48 hour(s))  I-stat troponin, ED     Status: None   Collection Time: 04/04/18  3:23 PM  Result Value Ref Range   Troponin i, poc 0.00 0.00 - 0.08 ng/mL   Comment 3            Comment: Due to the release kinetics of cTnI, a negative result within the first hours of the onset of symptoms does not rule out myocardial infarction with certainty. If myocardial infarction is still suspected, repeat the test at appropriate intervals.   CBC     Status: Abnormal   Collection Time: 04/04/18  3:26 PM  Result Value Ref Range   WBC 6.4 4.0 - 10.5 K/uL   RBC 2.77 (L) 4.22 - 5.81 MIL/uL   Hemoglobin 8.5 (L) 13.0 - 17.0 g/dL   HCT 26.9 (L) 39.0 - 52.0 %   MCV 97.1 80.0 - 100.0 fL   MCH 30.7 26.0 - 34.0 pg   MCHC 31.6 30.0 - 36.0 g/dL   RDW 16.3 (H) 11.5 - 15.5 %   Platelets 179 150 - 400 K/uL   nRBC 0.0 0.0 - 0.2 %    Comment: Performed at [facility], [place] [address] [place], [place] [address]  Comprehensive metabolic panel     Status: Abnormal   Collection Time: 04/04/18  3:29 PM  Result Value Ref Range   Sodium 135 135 - 145 mmol/L   Potassium 4.3 3.5 - 5.1 mmol/L   Chloride 103 98 - 111 mmol/L   CO2 24 22 - 32 mmol/L   Glucose, Bld 112 (H) 70 - 99 mg/dL   BUN 18 8 - 23 mg/dL   Creatinine, Ser 1.05 0.61 - 1.24 mg/dL   Calcium 8.9 8.9 - 10.3 mg/dL   Total Protein 6.2 (L)  6.5 - 8.1 g/dL   Albumin 2.4 (L) 3.5 - 5.0 g/dL   AST 102 (H) 15 - 41 U/L   ALT 69 (H) 0 - 44 U/L   Alkaline Phosphatase 470 (H) 38 - 126 U/L   Total Bilirubin 1.7 (H) 0.3 - 1.2 mg/dL   GFR calc non Af Amer >60 >60 mL/min   GFR calc Af Amer >60 >60 mL/min    Comment: (NOTE) The eGFR has been calculated using the CKD EPI equation. This calculation has not been validated in all clinical situations. eGFR's persistently <60 mL/min signify possible Chronic Kidney Disease.    Anion gap 8 5 - 15    Comment: Performed at [facility], [place] [address] [place], [place] [address]  Urinalysis, Routine w reflex microscopic     Status: Abnormal   Collection Time: 04/04/18  4:33 PM  Result Value Ref Range   Color, Urine AMBER (A) YELLOW    Comment: BIOCHEMICALS MAY BE AFFECTED BY COLOR   APPearance HAZY (A) CLEAR   Specific Gravity, Urine 1.026 1.005 - 1.030   pH 5.0 5.0 - 8.0   Glucose, UA NEGATIVE NEGATIVE mg/dL   Hgb urine dipstick NEGATIVE NEGATIVE   Bilirubin Urine NEGATIVE NEGATIVE   Ketones, ur NEGATIVE NEGATIVE mg/dL   Protein, ur 30 (A) NEGATIVE mg/dL  Nitrite NEGATIVE NEGATIVE   Leukocytes, UA NEGATIVE NEGATIVE   RBC / HPF 0-5 0 - 5 RBC/hpf   WBC, UA 0-5 0 - 5 WBC/hpf   Bacteria, UA RARE (A) NONE SEEN   Mucus PRESENT    Sperm, UA PRESENT     Comment: Performed at [facility], [place] [address], [place], [place] [address]    Chemistries  Recent Labs  Lab 04/04/18 1529  NA 135  K 4.3  CL 103  CO2 24  GLUCOSE 112*  BUN 18  CREATININE 1.05  CALCIUM 8.9  AST 102*  ALT 69*  ALKPHOS 470*  BILITOT 1.7*   ------------------------------------------------------------------------------------------------------------------  ------------------------------------------------------------------------------------------------------------------ GFR: Estimated Creatinine Clearance: 81.6 mL/min (by C-G formula based on SCr of 1.05 mg/dL). Liver  Function Tests: Recent Labs  Lab 04/04/18 1529  AST 102*  ALT 69*  ALKPHOS 470*  BILITOT 1.7*  PROT 6.2*  ALBUMIN 2.4*    --------------------------------------------------------------------------------------------------------------- Urine analysis:    Component Value Date/Time   COLORURINE AMBER (A) 04/04/2018 1633   APPEARANCEUR HAZY (A) 04/04/2018 1633   LABSPEC 1.026 04/04/2018 1633   PHURINE 5.0 04/04/2018 1633   GLUCOSEU NEGATIVE 04/04/2018 1633   GLUCOSEU NEGATIVE 11/01/2016 0949   HGBUR NEGATIVE 04/04/2018 1633   BILIRUBINUR NEGATIVE 04/04/2018 1633   KETONESUR NEGATIVE 04/04/2018 1633   PROTEINUR 30 (A) 04/04/2018 1633   UROBILINOGEN 0.2 11/01/2016 0949   NITRITE NEGATIVE 04/04/2018 1633   LEUKOCYTESUR NEGATIVE 04/04/2018 1633      Imaging Results:    Dg Chest 2 View  Result Date: 04/04/2018 CLINICAL DATA:  Shortness of Breath EXAM: CHEST - 2 VIEW COMPARISON:  Chest radiograph December 10, 2017 and chest CT February 14, 2018 FINDINGS: Port-A-Cath tip is in the superior vena cava near the cavoatrial junction. There is patchy infiltrate in the right base with small right pleural effusion. There is a small left pleural effusion with slight left base atelectasis. Heart size and pulmonary vascularity are within normal limits. Mild prominence in the right perihilar region is stable. Recent CT did show adenopathy in the right hilar region. No other adenopathy appreciable. No bone lesions. IMPRESSION: 1. Small pleural effusions bilaterally. Patchy consolidation posterior right base. Mild atelectasis left base. 2.  Stable cardiac silhouette. 3.  Mild right hilar adenopathy, better appreciable on recent CT. 4.  Port-A-Cath tip in superior vena cava.  No pneumothorax. Electronically Signed   By: Lowella Grip III M.D.   On: 04/04/2018 16:30   Ct Angio Chest Pe W And/or Wo Contrast  Result Date: 04/04/2018 CLINICAL DATA:  Shortness of breath and abdominal distention. History of  rectal cancer. EXAM: CT ANGIOGRAPHY CHEST CT ABDOMEN AND PELVIS WITH CONTRAST TECHNIQUE: Multidetector CT imaging of the chest was performed using the standard protocol during bolus administration of intravenous contrast. Multiplanar CT image reconstructions and MIPs were obtained to evaluate the vascular anatomy. Multidetector CT imaging of the abdomen and pelvis was performed using the standard protocol during bolus administration of intravenous contrast. CONTRAST:  128m ISOVUE-370 IOPAMIDOL (ISOVUE-370) INJECTION 76% COMPARISON:  CT 02/14/2018 FINDINGS: CTA CHEST FINDINGS Cardiovascular: The heart is normal in size. No pericardial effusion. The aorta is normal in caliber. No dissection. The pulmonary arterial tree is fairly well opacified. No definite filling defects to suggest pulmonary embolism. Mediastinum/Nodes: Extensive and progressive mediastinal and hilar lymphadenopathy. Lungs/Pleura: New bilateral pleural effusions larger on the right and moderate on the left. There is also overlying atelectasis. Interstitial pulmonary edema is noted but no definite infiltrates  or metastatic pulmonary nodules. Musculoskeletal: No chest wall mass. Suspect right-sided supraclavicular adenopathy. The Port-A-Cath is stable. Stable osseous metastatic disease. Review of the MIP images confirms the above findings. CT ABDOMEN and PELVIS FINDINGS Hepatobiliary: No obvious hepatic metastatic lesions. Moderate periportal edema and mild intrahepatic biliary dilatation. No common bile duct dilatation. Gallbladder is grossly normal. Stable perihepatic fluid. Pancreas: No mass, inflammation or ductal dilatation. Spleen: Normal size.  No focal lesions. Adrenals/Urinary Tract: Stable left adrenal gland lesion. The right adrenal gland is unremarkable. Both kidneys demonstrate poor perfusion renal cortical thinning. Left renal cyst is noted. No hydronephrosis. Uniform enhancing bladder wall thickening. Stomach/Bowel: The stomach,  duodenum, small bowel and colon are grossly normal. Suspect gastric wall thickening. Transverse colostomy noted. Vascular/Lymphatic: Enlarged celiac axis and hepatoduodenal ligament lymph nodes. Ill-defined retroperitoneal soft tissue density, likely tumor. No significant change. Reproductive: The prostate gland and seminal vesicles are unremarkable. Other: Diffuse body wall edema suggesting anasarca. Is also small volume abdominal/pelvic ascites, presacral edema and large bilateral hydroceles and marked scrotal swelling. Musculoskeletal: Stable scattered sclerotic bone lesions. Review of the MIP images confirms the above findings. IMPRESSION: 1. No CT findings for pulmonary embolism. 2. New sizable bilateral pleural effusions with overlying atelectasis. 3. Progressive mediastinal and hilar lymphadenopathy. 4. Marked body wall edema/anasarca. Small volume abdominal/pelvic ascites. 5. Stable to slightly progressive abdominal lymphadenopathy. 6. Fairly marked periportal edema in the liver. 7. Stable osseous lesions. Electronically Signed   By: Marijo Sanes M.D.   On: 04/04/2018 19:35   Ct Abdomen Pelvis W Contrast  Result Date: 04/04/2018 CLINICAL DATA:  Shortness of breath and abdominal distention. History of rectal cancer. EXAM: CT ANGIOGRAPHY CHEST CT ABDOMEN AND PELVIS WITH CONTRAST TECHNIQUE: Multidetector CT imaging of the chest was performed using the standard protocol during bolus administration of intravenous contrast. Multiplanar CT image reconstructions and MIPs were obtained to evaluate the vascular anatomy. Multidetector CT imaging of the abdomen and pelvis was performed using the standard protocol during bolus administration of intravenous contrast. CONTRAST:  153m ISOVUE-370 IOPAMIDOL (ISOVUE-370) INJECTION 76% COMPARISON:  CT 02/14/2018 FINDINGS: CTA CHEST FINDINGS Cardiovascular: The heart is normal in size. No pericardial effusion. The aorta is normal in caliber. No dissection. The pulmonary  arterial tree is fairly well opacified. No definite filling defects to suggest pulmonary embolism. Mediastinum/Nodes: Extensive and progressive mediastinal and hilar lymphadenopathy. Lungs/Pleura: New bilateral pleural effusions larger on the right and moderate on the left. There is also overlying atelectasis. Interstitial pulmonary edema is noted but no definite infiltrates or metastatic pulmonary nodules. Musculoskeletal: No chest wall mass. Suspect right-sided supraclavicular adenopathy. The Port-A-Cath is stable. Stable osseous metastatic disease. Review of the MIP images confirms the above findings. CT ABDOMEN and PELVIS FINDINGS Hepatobiliary: No obvious hepatic metastatic lesions. Moderate periportal edema and mild intrahepatic biliary dilatation. No common bile duct dilatation. Gallbladder is grossly normal. Stable perihepatic fluid. Pancreas: No mass, inflammation or ductal dilatation. Spleen: Normal size.  No focal lesions. Adrenals/Urinary Tract: Stable left adrenal gland lesion. The right adrenal gland is unremarkable. Both kidneys demonstrate poor perfusion renal cortical thinning. Left renal cyst is noted. No hydronephrosis. Uniform enhancing bladder wall thickening. Stomach/Bowel: The stomach, duodenum, small bowel and colon are grossly normal. Suspect gastric wall thickening. Transverse colostomy noted. Vascular/Lymphatic: Enlarged celiac axis and hepatoduodenal ligament lymph nodes. Ill-defined retroperitoneal soft tissue density, likely tumor. No significant change. Reproductive: The prostate gland and seminal vesicles are unremarkable. Other: Diffuse body wall edema suggesting anasarca. Is also small volume abdominal/pelvic ascites, presacral edema  and large bilateral hydroceles and marked scrotal swelling. Musculoskeletal: Stable scattered sclerotic bone lesions. Review of the MIP images confirms the above findings. IMPRESSION: 1. No CT findings for pulmonary embolism. 2. New sizable bilateral  pleural effusions with overlying atelectasis. 3. Progressive mediastinal and hilar lymphadenopathy. 4. Marked body wall edema/anasarca. Small volume abdominal/pelvic ascites. 5. Stable to slightly progressive abdominal lymphadenopathy. 6. Fairly marked periportal edema in the liver. 7. Stable osseous lesions. Electronically Signed   By: Marijo Sanes M.D.   On: 04/04/2018 19:35    My personal review of EKG: Rhythm NSR, prolonged QTc interval   Assessment & Plan:    Active Problems:   Anasarca   1. Anasarca-likely due to low albumin, 2.4 from poor p.o. intake, recent surgery.  Previous albumin was around 3.0 in September.  Will start Lasix 20 mg IV every 12 hours.  Also check BNP, echocardiogram to rule out underlying CHF.  Will obtain dietitian consult in a.m.  2. Transaminitis-patient has elevated liver enzymes, AST ALT and alk phos.  Could be secondary to passive congestion of liver from fluid overload.  Will repeat LFTs in a.m., if not improving consider abdominal ultrasound.  3. Community-acquired pneumonia-chest x-ray and CT chest only showed patchy consolidation, patient empirically started on vancomycin and cefepime.  Does not appear clinically to have pneumonia, consider scaling down antibiotics in a.m.  4. Stage IV rectal adenocarcinoma-status post diverting colostomy, followed by [facility] for a trial, to be started when patient is medically more stable  5. Prolonged QTc interval-QTC prolonged to 500, check serum magnesium level.  Monitor on telemetry.    DVT Prophylaxis-   Lovenox   AM Labs Ordered, also please review Full Orders  Family Communication: Admission, patients condition and plan of care including tests being ordered have been discussed with the patient and his wife and son at bedside* who indicate understanding and agree with the plan and Code Status.  Code Status: Full code  Admission status: Inpatient: Based on patients clinical presentation and  evaluation of above clinical data, I have made determination that patient meets Inpatient criteria at this time.  Requiring IV diuretics for anasarca, shortness of breath, IV antibiotics for pneumonia.  Time spent in minutes : 60 minutes   Oswald Hillock M.D on 04/04/2018 at 8:45 PM  Between 7am to 7pm - Pager - [phone]. After 7pm go to [email or web address] - password [email or web address]  [facility] - Office  [phone]

## 2018-04-04 NOTE — Progress Notes (Signed)
A consult was received from an ED provider for Vancomycin per pharmacy dosing. The patient's profile has been reviewed for ht/wt/allergies/indication/available labs.    A one time order has been placed for Vancomycin 2g IV.  Further antibiotics/pharmacy consults should be ordered by admitting physician if indicated.                       Thank you, Luiz Ochoa 04/04/2018  5:19 PM

## 2018-04-04 NOTE — ED Provider Notes (Signed)
[facility] DEPT Provider Note   CSN: [id number] Arrival date & time: 04/04/18  1425     History   Chief Complaint Chief Complaint  Patient presents with  . Shortness of Breath  . Bloated    HPI WAYMAN HOARD is a 67 y.o. male with a history of stage IV rectal adenocarcinoma who presents to the emergency department with dyspnea.  The patient was discharged from [facility] 4 days ago after undergoing diverting loop transverse colostomy and omentectomy on 03/25/2018.  He reports he was very active and is able to ambulate the halls without difficulty prior to discharge.  He states that since being discharged home that he has had gradually worsening dyspnea and swelling that began in his bilateral ankles and now extends of the bilateral legs, into the scrotum, and into the abdomen.  He reports a history of prior lower extremity swelling, but never accompanied by dyspnea.  He denies chest pain, fever, chills, nausea, vomiting, abdominal pain, rash, dizziness, lightheadedness, or eye symptoms, cough, or headache.   He has no cardiac history and is not followed by cardiology.  He also notes that his urine has been dark brown for the last 6 to 8 weeks.  No recent changes.  He does not wear home oxygen but is on 1.5 L [place] in the ED.   He also notes feeling more bloated and swollen after drinking fluids.  He states that he drank a glass of water with an emergency earlier today, which significantly worsened his symptoms.  Oncology Dr. Benay Spice.  He has stage IV rectal adenocarcinoma status post 4 cycles of FOLFIX chemotherapy.  He underwent diverting loop transverse colostomy and omentectomy on 03/25/2018 by Dr. Cloyd Stagers at [facility].  He is not currently undergoing any radiation or chemotherapy.  The history is provided by the patient. No language interpreter was used.    Past Medical History:  Diagnosis Date  . Allergic rhinitis, seasonal     . Anemia   . Bilateral hydrocele   . Bilateral hydronephrosis   . Chronic constipation   . Edema of right lower extremity   . Rectal cancer ([place]) dx 11/27/2016 via colonscopy w/ bx   oncolgoist-  dr Benay Spice--  rectal adenocarcinoma w/ metastatic retroperitoneal lymphadenopathy  . Wears glasses     Patient Active Problem List   Diagnosis Date Noted  . Anasarca 04/04/2018  . 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 Chronic pain of both shoulders 06/11/2017  . Normocytic anemia 01/29/2017  . Thrombocytopenia ([place]) 01/29/2017  . MSSA bacteremia 01/29/2017  . Bacteremia   . Cellulitis 01/28/2017  . Port-A-Cath in place 01/17/2017  . Goals of care, counseling/discussion 11/30/2016  . Rectal cancer ([place]) 11/27/2016  . Adenocarcinoma ([place]) 11/17/2016  . Right leg swelling 11/14/2016  . Retroperitoneal lymphadenopathy 11/01/2016  . Hyperlipidemia LDL goal <130 11/01/2016  . Hydronephrosis 11/01/2016  . Allergic rhinitis 02/18/2015  . Asthma   . Routine general medical examination at a health care facility 05/23/2012    Past Surgical History:  Procedure Laterality Date  . COLONOSCOPY WITH ESOPHAGOGASTRODUODENOSCOPY (EGD)  11-27-2016  dr Earlean Shawl   rectal mass  . CYSTOSCOPY WITH BIOPSY N/A 12/11/2016   Procedure: CYSTOSCOPY WITH BIOPSY;  Surgeon: Kathie Rhodes, MD;  Location: [facility];  Service: Urology;  Laterality: N/A;  . IR FLUORO GUIDE PORT INSERTION RIGHT  12/05/2016  . IR FLUORO GUIDE PORT INSERTION RIGHT  08/14/2017  . IR REMOVAL  TUN ACCESS W/ PORT W/O FL MOD SED  01/31/2017  . IR US GUIDE VASC ACCESS RIGHT  12/05/2016  . IR US GUIDE VASC ACCESS RIGHT  08/14/2017  . TEE WITHOUT CARDIOVERSION N/A 01/30/2017   Procedure: TRANSESOPHAGEAL ECHOCARDIOGRAM (TEE);  Surgeon: Acie Fredrickson Wonda Cheng, MD;  Location[facility] ENDOSCOPY;  Service: Cardiovascular;  Laterality: N/A;  . UMBILICAL HERNIA REPAIR  05/2016        Home Medications    Prior to Admission  medications   Medication Sig Start Date End Date Taking? Authorizing Provider  Polyethylene Glycol 3350 (MIRALAX PO) Take 17 g by mouth daily as needed (constipation).    Yes [provider]  traMADol (ULTRAM) 50 MG tablet Take 1 tablet (50 mg total) by mouth every 6 (six) hours as needed for moderate pain. 12/03/17  Yes Ladell Pier, MD  meloxicam (MOBIC) 15 MG tablet Take 1 tablet (15 mg total) by mouth daily. Patient not taking: Reported on 04/04/2018 06/11/17   Janith Lima, MD  ondansetron (ZOFRAN) 4 MG tablet Take 4 mg by mouth every 8 (eight) hours as needed for nausea or vomiting.  04/01/18   [provider]  prochlorperazine (COMPAZINE) 10 MG tablet Take 1 tablet (10 mg total) by mouth every 6 (six) hours as needed for nausea or vomiting. Patient not taking: Reported on 04/04/2018 11/30/16   [name], NP    Family History Family History  Problem Relation Age of Onset  . Breast cancer Mother   . Alcohol abuse Father   . Heart disease Other   . Cancer Other        Breast Cancer  . Arthritis Other   . Alcohol abuse Other   . Drug abuse Other   . Stroke Neg Hx   . Kidney disease Neg Hx   . Hypertension Neg Hx   . Hyperlipidemia Neg Hx   . Early death Neg Hx   . Colon cancer Neg Hx   . Esophageal cancer Neg Hx   . Rectal cancer Neg Hx   . Stomach cancer Neg Hx     Social History Social History   Tobacco Use  . Smoking status: Former Smoker    Packs/day: 1.00    Years: 10.00    Pack years: 10.00    Types: Cigarettes, Pipe, Cigars    Last attempt to quit: 06/12/1988    Years since quitting: 29.8  . Smokeless tobacco: Former Systems developer    Types: Chew    Quit date: 06/12/1993  Substance Use Topics  . Alcohol use: No  . Drug use: No     Allergies   Oxaliplatin   Review of Systems Review of Systems  Constitutional: Negative for appetite change and fever.  Respiratory: Positive for shortness of breath. Negative for apnea, cough and chest  tightness.   Cardiovascular: Positive for leg swelling. Negative for chest pain.  Gastrointestinal: Positive for abdominal distention. Negative for abdominal pain, diarrhea, nausea and vomiting.  Genitourinary: Positive for scrotal swelling. Negative for dysuria, flank pain, penile swelling and urgency.  Musculoskeletal: Negative for back pain.  Skin: Negative for rash.  Allergic/Immunologic: Negative for immunocompromised state.  Neurological: Negative for weakness and headaches.  Psychiatric/Behavioral: Negative for confusion.     Physical Exam Updated Vital Signs BP 126/72   Pulse (!) 106   Temp 97.6 F (36.4 C) (Oral)   Resp 14   Ht '6\' 3"'  (1.905 m)   Wt 90.7 kg   SpO2 100%  BMI 25.00 kg/m   Physical Exam  Constitutional: He appears well-developed.  Nasal cannula in place.  He is acutely and chronically ill-appearing.  Anasarca extending from the bilateral ankles to the abdomen.  Appears mildly jaundiced, per family this appears to be his baseline.  HENT:  Head: Normocephalic.  Eyes: Conjunctivae are normal.  Neck: Neck supple.  Cardiovascular: Regular rhythm. Tachycardia present. Exam reveals no gallop and no friction rub.  No murmur heard. Pulmonary/Chest: Effort normal. No stridor. No respiratory distress. He has no wheezes.  Rhonchi are present bilaterally.  Abdominal: Soft. He exhibits distension. He exhibits no mass. There is no tenderness. There is no rebound and no guarding. No hernia.  Ostomy is in place in the left lower quadrant.  No tenderness to the surrounding area.  Ostomy output is light brown.  Musculoskeletal: He exhibits no tenderness.  3+ pitting edema to the bilateral lower extremities that extends into the bilateral thighs  Neurological: He is alert.  Skin: Skin is warm and dry.  Psychiatric: His behavior is normal.  Nursing note and vitals reviewed.   ED Treatments / Results  Labs (all labs ordered are listed, but only abnormal results are  displayed) Labs Reviewed  CBC - Abnormal; Notable for the following components:      Result Value   RBC 2.77 (*)    Hemoglobin 8.5 (*)    HCT 26.9 (*)    RDW 16.3 (*)    All other components within normal limits  COMPREHENSIVE METABOLIC PANEL - Abnormal; Notable for the following components:   Glucose, Bld 112 (*)    Total Protein 6.2 (*)    Albumin 2.4 (*)    AST 102 (*)    ALT 69 (*)    Alkaline Phosphatase 470 (*)    Total Bilirubin 1.7 (*)    All other components within normal limits  URINALYSIS, ROUTINE W REFLEX MICROSCOPIC - Abnormal; Notable for the following components:   Color, Urine AMBER (*)    APPearance HAZY (*)    Protein, ur 30 (*)    Bacteria, UA RARE (*)    All other components within normal limits  CULTURE, BLOOD (ROUTINE X 2)  CULTURE, BLOOD (ROUTINE X 2)  BRAIN NATRIURETIC PEPTIDE  CREATININE, SERUM  MAGNESIUM  HIV ANTIBODY (ROUTINE TESTING W REFLEX)  CBC  COMPREHENSIVE METABOLIC PANEL  I-STAT TROPONIN, ED    EKG EKG Interpretation  Date/Time:  Thursday April 04 2018 14:40:51 EDT Ventricular Rate:  108 PR Interval:    QRS Duration: 72 QT Interval:  379 QTC Calculation: 508 R Axis:   65 Text Interpretation:  Sinus tachycardia Low voltage, precordial leads Nonspecific T abnrm, anterolateral leads Prolonged QT interval no prior to compare with Confirmed by Aletta Edouard [phone]) on 04/04/2018 3:12:28 PM   Radiology Dg Chest 2 View  Result Date: 04/04/2018 CLINICAL DATA:  Shortness of Breath EXAM: CHEST - 2 VIEW COMPARISON:  Chest radiograph December 10, 2017 and chest CT February 14, 2018 FINDINGS: Port-A-Cath tip is in the superior vena cava near the cavoatrial junction. There is patchy infiltrate in the right base with small right pleural effusion. There is a small left pleural effusion with slight left base atelectasis. Heart size and pulmonary vascularity are within normal limits. Mild prominence in the right perihilar region is stable. Recent CT  did show adenopathy in the right hilar region. No other adenopathy appreciable. No bone lesions. IMPRESSION: 1. Small pleural effusions bilaterally. Patchy consolidation posterior right base. Mild atelectasis left  base. 2.  Stable cardiac silhouette. 3.  Mild right hilar adenopathy, better appreciable on recent CT. 4.  Port-A-Cath tip in superior vena cava.  No pneumothorax. Electronically Signed   By: Lowella Grip III M.D.   On: 04/04/2018 16:30   Ct Angio Chest Pe W And/or Wo Contrast  Result Date: 04/04/2018 CLINICAL DATA:  Shortness of breath and abdominal distention. History of rectal cancer. EXAM: CT ANGIOGRAPHY CHEST CT ABDOMEN AND PELVIS WITH CONTRAST TECHNIQUE: Multidetector CT imaging of the chest was performed using the standard protocol during bolus administration of intravenous contrast. Multiplanar CT image reconstructions and MIPs were obtained to evaluate the vascular anatomy. Multidetector CT imaging of the abdomen and pelvis was performed using the standard protocol during bolus administration of intravenous contrast. CONTRAST:  128m ISOVUE-370 IOPAMIDOL (ISOVUE-370) INJECTION 76% COMPARISON:  CT 02/14/2018 FINDINGS: CTA CHEST FINDINGS Cardiovascular: The heart is normal in size. No pericardial effusion. The aorta is normal in caliber. No dissection. The pulmonary arterial tree is fairly well opacified. No definite filling defects to suggest pulmonary embolism. Mediastinum/Nodes: Extensive and progressive mediastinal and hilar lymphadenopathy. Lungs/Pleura: New bilateral pleural effusions larger on the right and moderate on the left. There is also overlying atelectasis. Interstitial pulmonary edema is noted but no definite infiltrates or metastatic pulmonary nodules. Musculoskeletal: No chest wall mass. Suspect right-sided supraclavicular adenopathy. The Port-A-Cath is stable. Stable osseous metastatic disease. Review of the MIP images confirms the above findings. CT ABDOMEN and PELVIS  FINDINGS Hepatobiliary: No obvious hepatic metastatic lesions. Moderate periportal edema and mild intrahepatic biliary dilatation. No common bile duct dilatation. Gallbladder is grossly normal. Stable perihepatic fluid. Pancreas: No mass, inflammation or ductal dilatation. Spleen: Normal size.  No focal lesions. Adrenals/Urinary Tract: Stable left adrenal gland lesion. The right adrenal gland is unremarkable. Both kidneys demonstrate poor perfusion renal cortical thinning. Left renal cyst is noted. No hydronephrosis. Uniform enhancing bladder wall thickening. Stomach/Bowel: The stomach, duodenum, small bowel and colon are grossly normal. Suspect gastric wall thickening. Transverse colostomy noted. Vascular/Lymphatic: Enlarged celiac axis and hepatoduodenal ligament lymph nodes. Ill-defined retroperitoneal soft tissue density, likely tumor. No significant change. Reproductive: The prostate gland and seminal vesicles are unremarkable. Other: Diffuse body wall edema suggesting anasarca. Is also small volume abdominal/pelvic ascites, presacral edema and large bilateral hydroceles and marked scrotal swelling. Musculoskeletal: Stable scattered sclerotic bone lesions. Review of the MIP images confirms the above findings. IMPRESSION: 1. No CT findings for pulmonary embolism. 2. New sizable bilateral pleural effusions with overlying atelectasis. 3. Progressive mediastinal and hilar lymphadenopathy. 4. Marked body wall edema/anasarca. Small volume abdominal/pelvic ascites. 5. Stable to slightly progressive abdominal lymphadenopathy. 6. Fairly marked periportal edema in the liver. 7. Stable osseous lesions. Electronically Signed   By: PMarijo SanesM.D.   On: 04/04/2018 19:35   Ct Abdomen Pelvis W Contrast  Result Date: 04/04/2018 CLINICAL DATA:  Shortness of breath and abdominal distention. History of rectal cancer. EXAM: CT ANGIOGRAPHY CHEST CT ABDOMEN AND PELVIS WITH CONTRAST TECHNIQUE: Multidetector CT imaging of the  chest was performed using the standard protocol during bolus administration of intravenous contrast. Multiplanar CT image reconstructions and MIPs were obtained to evaluate the vascular anatomy. Multidetector CT imaging of the abdomen and pelvis was performed using the standard protocol during bolus administration of intravenous contrast. CONTRAST:  1054mISOVUE-370 IOPAMIDOL (ISOVUE-370) INJECTION 76% COMPARISON:  CT 02/14/2018 FINDINGS: CTA CHEST FINDINGS Cardiovascular: The heart is normal in size. No pericardial effusion. The aorta is normal in caliber. No dissection. The pulmonary arterial  tree is fairly well opacified. No definite filling defects to suggest pulmonary embolism. Mediastinum/Nodes: Extensive and progressive mediastinal and hilar lymphadenopathy. Lungs/Pleura: New bilateral pleural effusions larger on the right and moderate on the left. There is also overlying atelectasis. Interstitial pulmonary edema is noted but no definite infiltrates or metastatic pulmonary nodules. Musculoskeletal: No chest wall mass. Suspect right-sided supraclavicular adenopathy. The Port-A-Cath is stable. Stable osseous metastatic disease. Review of the MIP images confirms the above findings. CT ABDOMEN and PELVIS FINDINGS Hepatobiliary: No obvious hepatic metastatic lesions. Moderate periportal edema and mild intrahepatic biliary dilatation. No common bile duct dilatation. Gallbladder is grossly normal. Stable perihepatic fluid. Pancreas: No mass, inflammation or ductal dilatation. Spleen: Normal size.  No focal lesions. Adrenals/Urinary Tract: Stable left adrenal gland lesion. The right adrenal gland is unremarkable. Both kidneys demonstrate poor perfusion renal cortical thinning. Left renal cyst is noted. No hydronephrosis. Uniform enhancing bladder wall thickening. Stomach/Bowel: The stomach, duodenum, small bowel and colon are grossly normal. Suspect gastric wall thickening. Transverse colostomy noted.  Vascular/Lymphatic: Enlarged celiac axis and hepatoduodenal ligament lymph nodes. Ill-defined retroperitoneal soft tissue density, likely tumor. No significant change. Reproductive: The prostate gland and seminal vesicles are unremarkable. Other: Diffuse body wall edema suggesting anasarca. Is also small volume abdominal/pelvic ascites, presacral edema and large bilateral hydroceles and marked scrotal swelling. Musculoskeletal: Stable scattered sclerotic bone lesions. Review of the MIP images confirms the above findings. IMPRESSION: 1. No CT findings for pulmonary embolism. 2. New sizable bilateral pleural effusions with overlying atelectasis. 3. Progressive mediastinal and hilar lymphadenopathy. 4. Marked body wall edema/anasarca. Small volume abdominal/pelvic ascites. 5. Stable to slightly progressive abdominal lymphadenopathy. 6. Fairly marked periportal edema in the liver. 7. Stable osseous lesions. Electronically Signed   By: Marijo Sanes M.D.   On: 04/04/2018 19:35    Procedures Procedures (including critical care time)  Medications Ordered in ED Medications  iopamidol (ISOVUE-370) 76 % injection (has no administration in time range)  sodium chloride 0.9 % injection (has no administration in time range)  enoxaparin (LOVENOX) injection 40 mg (40 mg Subcutaneous Given 04/04/18 2215)  sodium chloride flush (NS) 0.9 % injection 3 mL (3 mLs Intravenous Not Given 04/04/18 2156)  sodium chloride flush (NS) 0.9 % injection 3 mL (has no administration in time range)  0.9 %  sodium chloride infusion (has no administration in time range)  ondansetron (ZOFRAN) tablet 4 mg (has no administration in time range)    Or  ondansetron (ZOFRAN) injection 4 mg (has no administration in time range)  furosemide (LASIX) injection 20 mg (20 mg Intravenous Given 04/04/18 2215)  vancomycin (VANCOCIN) IVPB 1000 mg/200 mL premix (has no administration in time range)  ceFEPIme (MAXIPIME) 1 g in sodium chloride 0.9 % 100  mL IVPB (1 g Intravenous New Bag/Given 04/05/18 0136)  traMADol (ULTRAM) tablet 50 mg (50 mg Oral Given 04/05/18 0139)  traMADol (ULTRAM) tablet 50 mg (50 mg Oral Given 04/04/18 1606)  furosemide (LASIX) injection 20 mg (20 mg Intravenous Given 04/04/18 1749)  ceFEPIme (MAXIPIME) 2 g in sodium chloride 0.9 % 100 mL IVPB (0 g Intravenous Stopped 04/04/18 1819)  vancomycin (VANCOCIN) 2,000 mg in sodium chloride 0.9 % 500 mL IVPB (0 mg Intravenous Stopped 04/04/18 2055)  iopamidol (ISOVUE-370) 76 % injection 100 mL (100 mLs Intravenous Contrast Given 04/04/18 1855)     Initial Impression / Assessment and Plan / ED Course  I have reviewed the triage vital signs and the nursing notes.  Pertinent labs & imaging results that  were available during my care of the patient were reviewed by me and considered in my medical decision making (see chart for details).  Clinical Course as of Apr 06 151  Thu Apr 04, 6924  1039 67 year old male with history of rectal cancer now just status post a resection and colostomy here with generalized fluid overload and increased shortness of breath.  He has pitting edema up to his abdomen.  His x-ray was read as possible consolidation but were putting him in for CT chest and abdomen.  Lab work is abnormal but fairly close to his post operative values.   [MB]    Clinical Course User Index [MB] Hayden Rasmussen, MD    67 year old male with a history of stage IV rectal adenocarcinoma presented with anasarca, dyspnea, and tachycardia since he was discharged 4 days ago from [facility] after a diverting colostomy was placed.   Tachycardic and normotensive with tachypnea on arrival.  Labs are notable for elevated transaminases, with minimal change from [facility].  Total bili has increased to 1.7 from 1.0 and alk phos is more acutely elevated at 470.  Hemoglobin is 8.5, but little change from 8.9 on 10/21 at [facility].  EKG with sinus tachycardia, nonspecific T wave abnormalities, and prolonged  QT with no previous available for comparison.  Troponin is negative.  Chest x-ray with small pleural effusions bilaterally and patchy consolidation in the posterior right base with mild atelectasis in the left base.  There is also mild right hilar adenopathy.  The patient was started on vancomycin and cefepime for concern for HCAP.   Given recent surgery with active malignancy in the setting of recent surgery, CT PE study was ordered, which was negative PE, but did demonstrate new sizable bilateral pleural effusions with overlying atelectasis and progressive mediastinal and hilar lymphadenopathy.  There is marked anasarca with small volume ascites with fairly marketed periportal edema in the liver.  20 mg of IV Lasix given in the ED.   BNP 14.6.  No history of CHF or CAD.  Given the patient's CT, suspect hepatic etiology of anasarca.  The patient's wife reports that the patient does not appear jaundiced to her. Per wife and patient, he is a FULL CODE at this time.   Consulted the hospitalist team and spoke with Dr. Darrick Meigs who will accept the patient for admission for further work up and evaluation. The patient appears reasonably stabilized for admission considering the current resources, flow, and capabilities available in the ED at this time, and I doubt any other [facility] requiring further screening and/or treatment in the ED prior to admission.  Final Clinical Impressions(s) / ED Diagnoses   Final diagnoses:  Center    ED Discharge Orders    None       Joanne Gavel, PA-C 04/05/18 0152    Hayden Rasmussen, MD 04/05/18 1144

## 2018-04-04 NOTE — ED Notes (Signed)
ED TO INPATIENT HANDOFF REPORT  Name/Age/Gender Nicholas Suarez 67 y.o. male  Code Status Code Status History    Date Active Date Inactive Code Status Order ID Comments User Context   01/28/2017 1639 02/01/2017 1830 Full Code [id number]  Georgette Shell, MD ED    Advance Directive Documentation     Most Recent Value  Type of Advance Directive  Healthcare Power of Attorney, Living will  Pre-existing out of facility DNR order (yellow form or pink MOST form)  -  "MOST" Form in Place?  -      Home/SNF/Other Home  Chief Complaint SOB; Ca Pt  Level of Care/Admitting Diagnosis ED Disposition    ED Disposition Condition Comment   Admit  Hospital Area: [facility] [[id number]]  Level of Care: Stepdown [14]  Admit to SDU based on following criteria: Hemodynamic compromise or significant risk of instability:  Patient requiring short term acute titration and management of vasoactive drips, and invasive monitoring (i.e., CVP and Arterial line).  Diagnosis: Anasarca [710626]  Admitting Physician: Loree Fee  Attending Physician: Oswald Hillock [[id number]]  Estimated length of stay: 3 - 4 days  Certification:: I certify this patient will need inpatient services for at least 2 midnights  PT Class (Do Not Modify): Inpatient [101]  PT Acc Code (Do Not Modify): Private [1]       Medical History Past Medical History:  Diagnosis Date  . Allergic rhinitis, seasonal   . Anemia   . Bilateral hydrocele   . Bilateral hydronephrosis   . Chronic constipation   . Edema of right lower extremity   . 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 Wears glasses     Allergies Allergies  Allergen Reactions  . Oxaliplatin Rash    IV Location/Drains/Wounds Patient Lines/Drains/Airways Status   Active Line/Drains/Airways    Name:   Placement date:   Placement  time:   Site:   Days:   Implanted Port 08/14/17 Right Chest   08/14/17    1225    Chest   233   PICC Single Lumen 02/01/17 PICC Left Brachial 41 cm 0 cm   02/01/17    1055    Brachial   427   Incision (Closed) 12/11/16 Penis   12/11/16    0857     479          Labs/Imaging Results for orders placed or performed during the hospital encounter of 04/04/18 (from the past 48 hour(s))  I-stat troponin, ED     Status: None   Collection Time: 04/04/18  3:23 PM  Result Value Ref Range   Troponin i, poc 0.00 0.00 - 0.08 ng/mL   Comment 3            Comment: Due to the release kinetics of cTnI, a negative result within the first hours of the onset of symptoms does not rule out myocardial infarction with certainty. If myocardial infarction is still suspected, repeat the test at appropriate intervals.   CBC     Status: Abnormal   Collection Time: 04/04/18  3:26 PM  Result Value Ref Range   WBC 6.4 4.0 - 10.5 K/uL   RBC 2.77 (L) 4.22 - 5.81 MIL/uL   Hemoglobin 8.5 (L) 13.0 - 17.0 g/dL   HCT 26.9 (L) 39.0 - 52.0 %   MCV 97.1 80.0 - 100.0 fL  MCH 30.7 26.0 - 34.0 pg   MCHC 31.6 30.0 - 36.0 g/dL   RDW 16.3 (H) 11.5 - 15.5 %   Platelets 179 150 - 400 K/uL   nRBC 0.0 0.0 - 0.2 %    Comment: Performed at [facility], [place] [address], [place], [place] [address]  Comprehensive metabolic panel     Status: Abnormal   Collection Time: 04/04/18  3:29 PM  Result Value Ref Range   Sodium 135 135 - 145 mmol/L   Potassium 4.3 3.5 - 5.1 mmol/L   Chloride 103 98 - 111 mmol/L   CO2 24 22 - 32 mmol/L   Glucose, Bld 112 (H) 70 - 99 mg/dL   BUN 18 8 - 23 mg/dL   Creatinine, Ser 1.05 0.61 - 1.24 mg/dL   Calcium 8.9 8.9 - 10.3 mg/dL   Total Protein 6.2 (L) 6.5 - 8.1 g/dL   Albumin 2.4 (L) 3.5 - 5.0 g/dL   AST 102 (H) 15 - 41 U/L   ALT 69 (H) 0 - 44 U/L   Alkaline Phosphatase 470 (H) 38 - 126 U/L   Total Bilirubin 1.7 (H) 0.3 - 1.2 mg/dL   GFR calc non Af Amer >60 >60 mL/min   GFR  calc Af Amer >60 >60 mL/min    Comment: (NOTE) The eGFR has been calculated using the CKD EPI equation. This calculation has not been validated in all clinical situations. eGFR's persistently <60 mL/min signify possible Chronic Kidney Disease.    Anion gap 8 5 - 15    Comment: Performed at [facility], [place] [address], [place], [place] [address]  Urinalysis, Routine w reflex microscopic     Status: Abnormal   Collection Time: 04/04/18  4:33 PM  Result Value Ref Range   Color, Urine AMBER (A) YELLOW    Comment: BIOCHEMICALS MAY BE AFFECTED BY COLOR   APPearance HAZY (A) CLEAR   Specific Gravity, Urine 1.026 1.005 - 1.030   pH 5.0 5.0 - 8.0   Glucose, UA NEGATIVE NEGATIVE mg/dL   Hgb urine dipstick NEGATIVE NEGATIVE   Bilirubin Urine NEGATIVE NEGATIVE   Ketones, ur NEGATIVE NEGATIVE mg/dL   Protein, ur 30 (A) NEGATIVE mg/dL   Nitrite NEGATIVE NEGATIVE   Leukocytes, UA NEGATIVE NEGATIVE   RBC / HPF 0-5 0 - 5 RBC/hpf   WBC, UA 0-5 0 - 5 WBC/hpf   Bacteria, UA RARE (A) NONE SEEN   Mucus PRESENT    Sperm, UA PRESENT     Comment: Performed at [facility], [place] [address], [place], [place] [address]   Dg Chest 2 View  Result Date: 04/04/2018 CLINICAL DATA:  Shortness of Breath EXAM: CHEST - 2 VIEW COMPARISON:  Chest radiograph December 10, 2017 and chest CT February 14, 2018 FINDINGS: Port-A-Cath tip is in the superior vena cava near the cavoatrial junction. There is patchy infiltrate in the right base with small right pleural effusion. There is a small left pleural effusion with slight left base atelectasis. Heart size and pulmonary vascularity are within normal limits. Mild prominence in the right perihilar region is stable. Recent CT did show adenopathy in the right hilar region. No other adenopathy appreciable. No bone lesions. IMPRESSION: 1. Small pleural effusions bilaterally. Patchy consolidation posterior right base. Mild atelectasis left base. 2.   Stable cardiac silhouette. 3.  Mild right hilar adenopathy, better appreciable on recent CT. 4.  Port-A-Cath tip in superior vena cava.  No pneumothorax. Electronically Signed  By: Lowella Grip III M.D.   On: 04/04/2018 16:30   Ct Angio Chest Pe W And/or Wo Contrast  Result Date: 04/04/2018 CLINICAL DATA:  Shortness of breath and abdominal distention. History of rectal cancer. EXAM: CT ANGIOGRAPHY CHEST CT ABDOMEN AND PELVIS WITH CONTRAST TECHNIQUE: Multidetector CT imaging of the chest was performed using the standard protocol during bolus administration of intravenous contrast. Multiplanar CT image reconstructions and MIPs were obtained to evaluate the vascular anatomy. Multidetector CT imaging of the abdomen and pelvis was performed using the standard protocol during bolus administration of intravenous contrast. CONTRAST:  177m ISOVUE-370 IOPAMIDOL (ISOVUE-370) INJECTION 76% COMPARISON:  CT 02/14/2018 FINDINGS: CTA CHEST FINDINGS Cardiovascular: The heart is normal in size. No pericardial effusion. The aorta is normal in caliber. No dissection. The pulmonary arterial tree is fairly well opacified. No definite filling defects to suggest pulmonary embolism. Mediastinum/Nodes: Extensive and progressive mediastinal and hilar lymphadenopathy. Lungs/Pleura: New bilateral pleural effusions larger on the right and moderate on the left. There is also overlying atelectasis. Interstitial pulmonary edema is noted but no definite infiltrates or metastatic pulmonary nodules. Musculoskeletal: No chest wall mass. Suspect right-sided supraclavicular adenopathy. The Port-A-Cath is stable. Stable osseous metastatic disease. Review of the MIP images confirms the above findings. CT ABDOMEN and PELVIS FINDINGS Hepatobiliary: No obvious hepatic metastatic lesions. Moderate periportal edema and mild intrahepatic biliary dilatation. No common bile duct dilatation. Gallbladder is grossly normal. Stable perihepatic fluid.  Pancreas: No mass, inflammation or ductal dilatation. Spleen: Normal size.  No focal lesions. Adrenals/Urinary Tract: Stable left adrenal gland lesion. The right adrenal gland is unremarkable. Both kidneys demonstrate poor perfusion renal cortical thinning. Left renal cyst is noted. No hydronephrosis. Uniform enhancing bladder wall thickening. Stomach/Bowel: The stomach, duodenum, small bowel and colon are grossly normal. Suspect gastric wall thickening. Transverse colostomy noted. Vascular/Lymphatic: Enlarged celiac axis and hepatoduodenal ligament lymph nodes. Ill-defined retroperitoneal soft tissue density, likely tumor. No significant change. Reproductive: The prostate gland and seminal vesicles are unremarkable. Other: Diffuse body wall edema suggesting anasarca. Is also small volume abdominal/pelvic ascites, presacral edema and large bilateral hydroceles and marked scrotal swelling. Musculoskeletal: Stable scattered sclerotic bone lesions. Review of the MIP images confirms the above findings. IMPRESSION: 1. No CT findings for pulmonary embolism. 2. New sizable bilateral pleural effusions with overlying atelectasis. 3. Progressive mediastinal and hilar lymphadenopathy. 4. Marked body wall edema/anasarca. Small volume abdominal/pelvic ascites. 5. Stable to slightly progressive abdominal lymphadenopathy. 6. Fairly marked periportal edema in the liver. 7. Stable osseous lesions. Electronically Signed   By: PMarijo SanesM.D.   On: 04/04/2018 19:35   Ct Abdomen Pelvis W Contrast  Result Date: 04/04/2018 CLINICAL DATA:  Shortness of breath and abdominal distention. History of rectal cancer. EXAM: CT ANGIOGRAPHY CHEST CT ABDOMEN AND PELVIS WITH CONTRAST TECHNIQUE: Multidetector CT imaging of the chest was performed using the standard protocol during bolus administration of intravenous contrast. Multiplanar CT image reconstructions and MIPs were obtained to evaluate the vascular anatomy. Multidetector CT imaging  of the abdomen and pelvis was performed using the standard protocol during bolus administration of intravenous contrast. CONTRAST:  1063mISOVUE-370 IOPAMIDOL (ISOVUE-370) INJECTION 76% COMPARISON:  CT 02/14/2018 FINDINGS: CTA CHEST FINDINGS Cardiovascular: The heart is normal in size. No pericardial effusion. The aorta is normal in caliber. No dissection. The pulmonary arterial tree is fairly well opacified. No definite filling defects to suggest pulmonary embolism. Mediastinum/Nodes: Extensive and progressive mediastinal and hilar lymphadenopathy. Lungs/Pleura: New bilateral pleural effusions larger on the right and moderate  on the left. There is also overlying atelectasis. Interstitial pulmonary edema is noted but no definite infiltrates or metastatic pulmonary nodules. Musculoskeletal: No chest wall mass. Suspect right-sided supraclavicular adenopathy. The Port-A-Cath is stable. Stable osseous metastatic disease. Review of the MIP images confirms the above findings. CT ABDOMEN and PELVIS FINDINGS Hepatobiliary: No obvious hepatic metastatic lesions. Moderate periportal edema and mild intrahepatic biliary dilatation. No common bile duct dilatation. Gallbladder is grossly normal. Stable perihepatic fluid. Pancreas: No mass, inflammation or ductal dilatation. Spleen: Normal size.  No focal lesions. Adrenals/Urinary Tract: Stable left adrenal gland lesion. The right adrenal gland is unremarkable. Both kidneys demonstrate poor perfusion renal cortical thinning. Left renal cyst is noted. No hydronephrosis. Uniform enhancing bladder wall thickening. Stomach/Bowel: The stomach, duodenum, small bowel and colon are grossly normal. Suspect gastric wall thickening. Transverse colostomy noted. Vascular/Lymphatic: Enlarged celiac axis and hepatoduodenal ligament lymph nodes. Ill-defined retroperitoneal soft tissue density, likely tumor. No significant change. Reproductive: The prostate gland and seminal vesicles are  unremarkable. Other: Diffuse body wall edema suggesting anasarca. Is also small volume abdominal/pelvic ascites, presacral edema and large bilateral hydroceles and marked scrotal swelling. Musculoskeletal: Stable scattered sclerotic bone lesions. Review of the MIP images confirms the above findings. IMPRESSION: 1. No CT findings for pulmonary embolism. 2. New sizable bilateral pleural effusions with overlying atelectasis. 3. Progressive mediastinal and hilar lymphadenopathy. 4. Marked body wall edema/anasarca. Small volume abdominal/pelvic ascites. 5. Stable to slightly progressive abdominal lymphadenopathy. 6. Fairly marked periportal edema in the liver. 7. Stable osseous lesions. Electronically Signed   By: Marijo Sanes M.D.   On: 04/04/2018 19:35   EKG Interpretation  Date/Time:  Thursday April 04 2018 14:40:51 EDT Ventricular Rate:  108 PR Interval:    QRS Duration: 72 QT Interval:  379 QTC Calculation: 508 R Axis:   65 Text Interpretation:  Sinus tachycardia Low voltage, precordial leads Nonspecific T abnrm, anterolateral leads Prolonged QT interval no prior to compare with Confirmed by Aletta Edouard [phone]) on 04/04/2018 3:12:28 PM   Pending Labs Unresulted Labs (From admission, onward)    Start     Ordered   04/05/18 0500  Creatinine, serum  Daily,   R     04/04/18 2050   04/04/18 2045  Culture, blood (Routine X 2) w Reflex to ID Panel  BLOOD CULTURE X 2,   R     04/04/18 2044   04/04/18 2044  Brain natriuretic peptide  Once,   R     04/04/18 2043   Signed and Held  HIV antibody (Routine Testing)  Once,   R     Signed and Held   Signed and Held  CBC  (enoxaparin (LOVENOX)    CrCl >/= 30 ml/min)  Once,   R    Comments:  Baseline for enoxaparin therapy IF NOT ALREADY DRAWN.  Notify MD if PLT < 100 K.    Signed and Held   Signed and Held  Creatinine, serum  (enoxaparin (LOVENOX)    CrCl >/= 30 ml/min)  Once,   R    Comments:  Baseline for enoxaparin therapy IF NOT ALREADY  DRAWN.    Signed and Held   Signed and Held  Creatinine, serum  (enoxaparin (LOVENOX)    CrCl >/= 30 ml/min)  Weekly,   R    Comments:  while on enoxaparin therapy    Signed and Held   Signed and Held  CBC  Tomorrow morning,   R     Signed and Held  Signed and Held  Comprehensive metabolic panel  Tomorrow morning,   R     Signed and Held          Vitals/Pain Today's Vitals   04/04/18 2000 04/04/18 2015 04/04/18 2030 04/04/18 2100  BP: 112/71  97/85 109/68  Pulse: (!) 108 (!) 109 (!) 109 (!) 107  Resp: 13 (!) 21 (!) 21 16  Temp:      TempSrc:      SpO2: 100% 97% 95% 99%  Weight:      Height:      PainSc:        Isolation Precautions No active isolations  Medications Medications  iopamidol (ISOVUE-370) 76 % injection (has no administration in time range)  sodium chloride 0.9 % injection (has no administration in time range)  vancomycin (VANCOCIN) IVPB 1000 mg/200 mL premix (has no administration in time range)  ceFEPIme (MAXIPIME) 1 g in sodium chloride 0.9 % 100 mL IVPB (has no administration in time range)  traMADol (ULTRAM) tablet 50 mg (50 mg Oral Given 04/04/18 1606)  furosemide (LASIX) injection 20 mg (20 mg Intravenous Given 04/04/18 1749)  ceFEPIme (MAXIPIME) 2 g in sodium chloride 0.9 % 100 mL IVPB (0 g Intravenous Stopped 04/04/18 1819)  vancomycin (VANCOCIN) 2,000 mg in sodium chloride 0.9 % 500 mL IVPB (0 mg Intravenous Stopped 04/04/18 2055)  iopamidol (ISOVUE-370) 76 % injection 100 mL (100 mLs Intravenous Contrast Given 04/04/18 1855)    Mobility walks

## 2018-04-04 NOTE — Progress Notes (Signed)
Pharmacy Antibiotic Note  Nicholas Suarez is a 67 y.o. male admitted on 04/04/2018 with pneumonia.  Pharmacy has been consulted for vanc/cefepime dosing.  Plan: 1) Vanc 2g x 1 then 1g IV q12 - goal AUC 400-500 2) Cefepime 2g x 1 then 1g IV q8 3) Daily SCr   Height: 6\' 3"  (190.5 cm) Weight: 200 lb (90.7 kg) IBW/kg (Calculated) : 84.5  Temp (24hrs), Avg:97.6 F (36.4 C), Min:97.6 F (36.4 C), Max:97.6 F (36.4 C)  Recent Labs  Lab 04/04/18 1526 04/04/18 1529  WBC 6.4  --   CREATININE  --  1.05    Estimated Creatinine Clearance: 81.6 mL/min (by C-G formula based on SCr of 1.05 mg/dL).    Allergies  Allergen Reactions  . Oxaliplatin Rash     Thank you for allowing pharmacy to be a part of this patient's care.  Kara Mead 04/04/2018 8:45 PM

## 2018-04-04 NOTE — ED Triage Notes (Signed)
Pt complains of shortness of breath x 3 days. SOB is worse with exertion. Pt states he has been retaining fluid in his stomach. Pt denies chest pain.

## 2018-04-05 ENCOUNTER — Inpatient Hospital Stay (HOSPITAL_COMMUNITY): Payer: Medicare Other

## 2018-04-05 ENCOUNTER — Other Ambulatory Visit: Payer: Self-pay

## 2018-04-05 ENCOUNTER — Telehealth: Payer: Self-pay | Admitting: *Deleted

## 2018-04-05 DIAGNOSIS — R0602 Shortness of breath: Secondary | ICD-10-CM

## 2018-04-05 LAB — COMPREHENSIVE METABOLIC PANEL
ALT: 57 U/L — AB (ref 0–44)
AST: 79 U/L — AB (ref 15–41)
Albumin: 2.4 g/dL — ABNORMAL LOW (ref 3.5–5.0)
Alkaline Phosphatase: 390 U/L — ABNORMAL HIGH (ref 38–126)
Anion gap: 5 (ref 5–15)
BUN: 17 mg/dL (ref 8–23)
CHLORIDE: 102 mmol/L (ref 98–111)
CO2: 27 mmol/L (ref 22–32)
CREATININE: 1.23 mg/dL (ref 0.61–1.24)
Calcium: 9 mg/dL (ref 8.9–10.3)
GFR calc Af Amer: >60 mL/min (ref >60)
GFR, EST NON AFRICAN AMERICAN: 59 mL/min — AB (ref >60)
GLUCOSE: 111 mg/dL — AB (ref 70–99)
Potassium: 4.1 mmol/L (ref 3.5–5.1)
Sodium: 134 mmol/L — ABNORMAL LOW (ref 135–145)
Total Bilirubin: 1.6 mg/dL — ABNORMAL HIGH (ref 0.3–1.2)
Total Protein: 6.1 g/dL — ABNORMAL LOW (ref 6.5–8.1)

## 2018-04-05 LAB — ECHOCARDIOGRAM COMPLETE
Height: 75 in
Weight: 3537.94 oz

## 2018-04-05 LAB — CBC
HCT: 27.1 % — ABNORMAL LOW (ref 39.0–52.0)
Hemoglobin: 8.4 g/dL — ABNORMAL LOW (ref 13.0–17.0)
MCH: 30.9 pg (ref 26.0–34.0)
MCHC: 31 g/dL (ref 30.0–36.0)
MCV: 99.6 fL (ref 80.0–100.0)
NRBC: 0 % (ref 0.0–0.2)
PLATELETS: 173 10*3/uL (ref 150–400)
RBC: 2.72 MIL/uL — AB (ref 4.22–5.81)
RDW: 17 % — ABNORMAL HIGH (ref 11.5–15.5)
WBC: 5.9 10*3/uL (ref 4.0–10.5)

## 2018-04-05 LAB — MRSA PCR SCREENING: MRSA by PCR: NEGATIVE

## 2018-04-05 LAB — PROCALCITONIN: Procalcitonin: 0.16 ng/mL

## 2018-04-05 MED ORDER — PERFLUTREN LIPID MICROSPHERE
1.0000 mL | INTRAVENOUS | Status: AC | PRN
  Administered 2018-04-05: 2 mL via INTRAVENOUS
  Filled 2018-04-05: qty 10

## 2018-04-05 MED ORDER — CHLORHEXIDINE GLUCONATE CLOTH 2 % EX PADS
6.0000 | MEDICATED_PAD | Freq: Every day | CUTANEOUS | Status: DC
  Administered 2018-04-06 – 2018-04-07 (×2): 6 via TOPICAL

## 2018-04-05 MED ORDER — SODIUM CHLORIDE 0.9% FLUSH: 10.0000 mL | INTRAVENOUS | Status: DC | PRN

## 2018-04-05 MED ORDER — PREMIER PROTEIN SHAKE
11.0000 [oz_av] | ORAL | Status: DC
  Administered 2018-04-05: 11 [oz_av] via ORAL
  Filled 2018-04-05 (×3): qty 325.31

## 2018-04-05 MED ORDER — FUROSEMIDE 10 MG/ML IJ SOLN
60.0000 mg | Freq: Two times a day (BID) | INTRAMUSCULAR | Status: DC
  Administered 2018-04-05 – 2018-04-06 (×2): 60 mg via INTRAVENOUS
  Filled 2018-04-05 (×2): qty 6

## 2018-04-05 MED ORDER — BOOST PLUS PO LIQD
237.0000 mL | ORAL | Status: DC
  Filled 2018-04-05 (×4): qty 237

## 2018-04-05 NOTE — Progress Notes (Signed)
  Echocardiogram 2D Echocardiogram with definity has been performed.  Darlina Sicilian M 04/05/2018, 12:30 PM

## 2018-04-05 NOTE — Telephone Encounter (Signed)
TC from pts wife stating that her husband has been admitted to [facility] room #1239 as of yesterday with SOB and lower extremity edema, elevated LFT's His wife is calling to let Dr. Benay Spice and Ned Card know of this.She is concerned about about how he is doing and are the physician's doing the right thing by giving him Lasix for the fluid overload. Assured wife that this is a normal treatment measure. Also informed wife that I would let his oncology team know about his admission.  Also informed wife that if the hospital team needs to have medical oncology involved that a oncology consult would need to be done.  Wife voiced understanding.  She has been told that her husband may be discharged over the weekend and she is aware of his appt with Dr. Benay Spice on Monday. They intend to keep that appt.

## 2018-04-05 NOTE — Progress Notes (Signed)
PROGRESS NOTE    Nicholas Suarez  SWF:[id number] DOB: 1950-07-27 DOA: 04/04/2018 PCP: Janith Lima, MD   Brief Narrative:  67 year old man with past medical history relevant for stage IV adenocarcinoma of the rectum Status post failure of FOLFOX chemotherapy, being evaluated at [facility] for targeted therapy, status post diverting loop transverse colostomy and omentectomy on 03/25/2018 at [facility] admitted with diffuse anasarca, lower extremity edema, shortness of breath and mildly elevated LFTs.   Assessment & Plan:   Active Problems:   Anasarca   #) Fluid overload/anasarca: Patient reports that he was given significant fluids and blood transfusions during his surgery at [facility].  His albumin is also mildly low at 2.4 with a baseline of around 3.  Suspect this is a combination of hypoalbuminemia and fluid overload that is iatrogenic.  He has no evidence of coronary artery disease or ischemic or nonischemic congestive heart failure. -Continue IV furosemide 60 mg twice daily -Strict ins and outs, weigh daily -Pending echo on 04/05/2018  #) Elevated LFTs: Appears to have predominantly mixed hepatocellular and cholestatic pattern.  Noted to have some peri-hepatic edema on CT scan.  Suspect most likely related to fluid overload.  Have low suspicion for portal vein thrombus at this time -Liver ultrasound -Repeat LFTs  #) Stage IV rectal adenocarcinoma: Patient apparently is failed chemotherapy with FOLFOX.  He is pending evaluation for additional treatment at [facility].  He did undergo a loop transverse colostomy and omentectomy with placement of a ostomy due to obstruction.  Fluids: Restrict Electrolytes: Monitor and supplement Nutrition: Regular diet Exam prophylaxis: Enoxaparin  Disposition: Pending resolution of edema and oxygen requirement  Full code    Consultants:   None  Procedures:   Echo 04/05/2018: Pending  Antimicrobials:   IV  cefepime and vancomycin started 04/04/2018   Subjective: Patient reports he is feeling somewhat better.  He reports his breathing is much more comfortable.  He denies any improvement in his lower extremity edema or diffuse anasarca.  He denies any nausea, vomiting, abdominal pain.  He denies any chest pain.  Objective: Vitals:   04/05/18 0000 04/05/18 0300 04/05/18 0400 04/05/18 0759  BP:   (!) 101/56   Pulse:   (!) 105   Resp:   17   Temp: 97.6 F (36.4 C)  98.1 F (36.7 C) 97.7 F (36.5 C)  TempSrc: Oral  Oral Oral  SpO2:   97%   Weight:  100.3 kg    Height:        Intake/Output Summary (Last 24 hours) at 04/05/2018 1011 Last data filed at 04/05/2018 0416 Gross per 24 hour  Intake 594.28 ml  Output 350 ml  Net 244.28 ml   Filed Weights   04/04/18 1439 04/05/18 0300  Weight: 90.7 kg 100.3 kg    Examination:  General exam: Appears calm and comfortable  Respiratory system: Mildly increased work of breathing, diminished lung sounds at bases, no wheezes, crackles, rhonchi Cardiovascular system: Distant breath sounds, regular rate and rhythm, no murmurs. Gastrointestinal system: Soft, abdominal wall edema noted, diminished bowel sounds, no rebound or guarding, nontender, ostomy site is clean dry and intact with stool in bag Central nervous system: Alert and oriented. No focal neurological deficits. Extremities: 3+ lower extremity edema Skin: Well-healed midline incision Psychiatry: Judgement and insight appear normal. Mood & affect appropriate.     Data Reviewed: I have personally reviewed following labs and imaging studies  CBC: Recent Labs  Lab  04/04/18 1526  WBC 6.4  HGB 8.5*  HCT 26.9*  MCV 97.1  PLT 245   Basic Metabolic Panel: Recent Labs  Lab 04/04/18 1529 04/04/18 2215 04/04/18 2238  NA 135  --   --   K 4.3  --   --   CL 103  --   --   CO2 24  --   --   GLUCOSE 112*  --   --   BUN 18  --   --   CREATININE 1.05 1.10  --   CALCIUM 8.9  --   --    MG  --   --  2.2   GFR: Estimated Creatinine Clearance: 77.9 mL/min (by C-G formula based on SCr of 1.1 mg/dL). Liver Function Tests: Recent Labs  Lab 04/04/18 1529  AST 102*  ALT 69*  ALKPHOS 470*  BILITOT 1.7*  PROT 6.2*  ALBUMIN 2.4*   No results for input(s): LIPASE, AMYLASE in the last 168 hours. No results for input(s): AMMONIA in the last 168 hours. Coagulation Profile: No results for input(s): INR, PROTIME in the last 168 hours. Cardiac Enzymes: No results for input(s): CKTOTAL, CKMB, CKMBINDEX, TROPONINI in the last 168 hours. BNP (last 3 results) No results for input(s): PROBNP in the last 8760 hours. HbA1C: No results for input(s): HGBA1C in the last 72 hours. CBG: No results for input(s): GLUCAP in the last 168 hours. Lipid Profile: No results for input(s): CHOL, HDL, LDLCALC, TRIG, CHOLHDL, LDLDIRECT in the last 72 hours. Thyroid Function Tests: No results for input(s): TSH, T4TOTAL, FREET4, T3FREE, THYROIDAB in the last 72 hours. Anemia Panel: No results for input(s): VITAMINB12, FOLATE, FERRITIN, TIBC, IRON, RETICCTPCT in the last 72 hours. Sepsis Labs: No results for input(s): PROCALCITON, LATICACIDVEN in the last 168 hours.  Recent Results (from the past 240 hour(s))  Culture, blood (Routine X 2) w Reflex to ID Panel     Status: None (Preliminary result)   Collection Time: 04/04/18 10:18 PM  Result Value Ref Range Status   Specimen Description   Final    BLOOD LEFT HAND Performed at [facility], [place] [address], [place], [place] [address]    Special Requests   Final    BOTTLES DRAWN AEROBIC AND ANAEROBIC Blood Culture adequate volume Performed at [facility] [address] [place], [place] [address]    Culture PENDING  Incomplete   Report Status PENDING  Incomplete         Radiology Studies: Dg Chest 2 View  Result Date: 04/04/2018 CLINICAL DATA:  Shortness of Breath EXAM: CHEST - 2 VIEW COMPARISON:  Chest  radiograph December 10, 2017 and chest CT February 14, 2018 FINDINGS: Port-A-Cath tip is in the superior vena cava near the cavoatrial junction. There is patchy infiltrate in the right base with small right pleural effusion. There is a small left pleural effusion with slight left base atelectasis. Heart size and pulmonary vascularity are within normal limits. Mild prominence in the right perihilar region is stable. Recent CT did show adenopathy in the right hilar region. No other adenopathy appreciable. No bone lesions. IMPRESSION: 1. Small pleural effusions bilaterally. Patchy consolidation posterior right base. Mild atelectasis left base. 2.  Stable cardiac silhouette. 3.  Mild right hilar adenopathy, better appreciable on recent CT. 4.  Port-A-Cath tip in superior vena cava.  No pneumothorax. Electronically Signed   By: Lowella Grip III M.D.   On: 04/04/2018 16:30   Ct Angio Chest Pe W  And/or Wo Contrast  Result Date: 04/04/2018 CLINICAL DATA:  Shortness of breath and abdominal distention. History of rectal cancer. EXAM: CT ANGIOGRAPHY CHEST CT ABDOMEN AND PELVIS WITH CONTRAST TECHNIQUE: Multidetector CT imaging of the chest was performed using the standard protocol during bolus administration of intravenous contrast. Multiplanar CT image reconstructions and MIPs were obtained to evaluate the vascular anatomy. Multidetector CT imaging of the abdomen and pelvis was performed using the standard protocol during bolus administration of intravenous contrast. CONTRAST:  123mL ISOVUE-370 IOPAMIDOL (ISOVUE-370) INJECTION 76% COMPARISON:  CT 02/14/2018 FINDINGS: CTA CHEST FINDINGS Cardiovascular: The heart is normal in size. No pericardial effusion. The aorta is normal in caliber. No dissection. The pulmonary arterial tree is fairly well opacified. No definite filling defects to suggest pulmonary embolism. Mediastinum/Nodes: Extensive and progressive mediastinal and hilar lymphadenopathy. Lungs/Pleura: New bilateral  pleural effusions larger on the right and moderate on the left. There is also overlying atelectasis. Interstitial pulmonary edema is noted but no definite infiltrates or metastatic pulmonary nodules. Musculoskeletal: No chest wall mass. Suspect right-sided supraclavicular adenopathy. The Port-A-Cath is stable. Stable osseous metastatic disease. Review of the MIP images confirms the above findings. CT ABDOMEN and PELVIS FINDINGS Hepatobiliary: No obvious hepatic metastatic lesions. Moderate periportal edema and mild intrahepatic biliary dilatation. No common bile duct dilatation. Gallbladder is grossly normal. Stable perihepatic fluid. Pancreas: No mass, inflammation or ductal dilatation. Spleen: Normal size.  No focal lesions. Adrenals/Urinary Tract: Stable left adrenal gland lesion. The right adrenal gland is unremarkable. Both kidneys demonstrate poor perfusion renal cortical thinning. Left renal cyst is noted. No hydronephrosis. Uniform enhancing bladder wall thickening. Stomach/Bowel: The stomach, duodenum, small bowel and colon are grossly normal. Suspect gastric wall thickening. Transverse colostomy noted. Vascular/Lymphatic: Enlarged celiac axis and hepatoduodenal ligament lymph nodes. Ill-defined retroperitoneal soft tissue density, likely tumor. No significant change. Reproductive: The prostate gland and seminal vesicles are unremarkable. Other: Diffuse body wall edema suggesting anasarca. Is also small volume abdominal/pelvic ascites, presacral edema and large bilateral hydroceles and marked scrotal swelling. Musculoskeletal: Stable scattered sclerotic bone lesions. Review of the MIP images confirms the above findings. IMPRESSION: 1. No CT findings for pulmonary embolism. 2. New sizable bilateral pleural effusions with overlying atelectasis. 3. Progressive mediastinal and hilar lymphadenopathy. 4. Marked body wall edema/anasarca. Small volume abdominal/pelvic ascites. 5. Stable to slightly progressive  abdominal lymphadenopathy. 6. Fairly marked periportal edema in the liver. 7. Stable osseous lesions. Electronically Signed   By: Marijo Sanes M.D.   On: 04/04/2018 19:35   Ct Abdomen Pelvis W Contrast  Result Date: 04/04/2018 CLINICAL DATA:  Shortness of breath and abdominal distention. History of rectal cancer. EXAM: CT ANGIOGRAPHY CHEST CT ABDOMEN AND PELVIS WITH CONTRAST TECHNIQUE: Multidetector CT imaging of the chest was performed using the standard protocol during bolus administration of intravenous contrast. Multiplanar CT image reconstructions and MIPs were obtained to evaluate the vascular anatomy. Multidetector CT imaging of the abdomen and pelvis was performed using the standard protocol during bolus administration of intravenous contrast. CONTRAST:  162mL ISOVUE-370 IOPAMIDOL (ISOVUE-370) INJECTION 76% COMPARISON:  CT 02/14/2018 FINDINGS: CTA CHEST FINDINGS Cardiovascular: The heart is normal in size. No pericardial effusion. The aorta is normal in caliber. No dissection. The pulmonary arterial tree is fairly well opacified. No definite filling defects to suggest pulmonary embolism. Mediastinum/Nodes: Extensive and progressive mediastinal and hilar lymphadenopathy. Lungs/Pleura: New bilateral pleural effusions larger on the right and moderate on the left. There is also overlying atelectasis. Interstitial pulmonary edema is noted but no definite infiltrates or  metastatic pulmonary nodules. Musculoskeletal: No chest wall mass. Suspect right-sided supraclavicular adenopathy. The Port-A-Cath is stable. Stable osseous metastatic disease. Review of the MIP images confirms the above findings. CT ABDOMEN and PELVIS FINDINGS Hepatobiliary: No obvious hepatic metastatic lesions. Moderate periportal edema and mild intrahepatic biliary dilatation. No common bile duct dilatation. Gallbladder is grossly normal. Stable perihepatic fluid. Pancreas: No mass, inflammation or ductal dilatation. Spleen: Normal size.   No focal lesions. Adrenals/Urinary Tract: Stable left adrenal gland lesion. The right adrenal gland is unremarkable. Both kidneys demonstrate poor perfusion renal cortical thinning. Left renal cyst is noted. No hydronephrosis. Uniform enhancing bladder wall thickening. Stomach/Bowel: The stomach, duodenum, small bowel and colon are grossly normal. Suspect gastric wall thickening. Transverse colostomy noted. Vascular/Lymphatic: Enlarged celiac axis and hepatoduodenal ligament lymph nodes. Ill-defined retroperitoneal soft tissue density, likely tumor. No significant change. Reproductive: The prostate gland and seminal vesicles are unremarkable. Other: Diffuse body wall edema suggesting anasarca. Is also small volume abdominal/pelvic ascites, presacral edema and large bilateral hydroceles and marked scrotal swelling. Musculoskeletal: Stable scattered sclerotic bone lesions. Review of the MIP images confirms the above findings. IMPRESSION: 1. No CT findings for pulmonary embolism. 2. New sizable bilateral pleural effusions with overlying atelectasis. 3. Progressive mediastinal and hilar lymphadenopathy. 4. Marked body wall edema/anasarca. Small volume abdominal/pelvic ascites. 5. Stable to slightly progressive abdominal lymphadenopathy. 6. Fairly marked periportal edema in the liver. 7. Stable osseous lesions. Electronically Signed   By: Marijo Sanes M.D.   On: 04/04/2018 19:35        Scheduled Meds: . enoxaparin (LOVENOX) injection  40 mg Subcutaneous Q24H  . furosemide  60 mg Intravenous BID  . sodium chloride flush  3 mL Intravenous Q12H   Continuous Infusions: . sodium chloride 250 mL (04/05/18 0908)  . ceFEPime (MAXIPIME) IV Stopped (04/05/18 0206)  . vancomycin 1,000 mg (04/05/18 0909)     LOS: 1 day    Time spent: [place], MD [facility]  If 7PM-7AM, please contact night-coverage [email or web address] Password [email or web address] 04/05/2018, 10:11 AM

## 2018-04-05 NOTE — Progress Notes (Signed)
Initial Nutrition Assessment  DOCUMENTATION CODES:   Not applicable  INTERVENTION:  - Will order Premier Protein once/day, this supplement provides 160 kcal and 30 grams of protein.  - Will order Boost Plus once/day, this supplement provides 360 kcal and 14 grams of protein. - Continue to encourage PO intakes. - Handouts from the Academy of [employer] Dietetics provided concerning protein content of foods.   NUTRITION DIAGNOSIS:   Increased nutrient needs related to chronic illness, catabolic illness, cancer and cancer related treatments as evidenced by estimated needs.  GOAL:   Patient will meet greater than or equal to 90% of their needs  MONITOR:   PO intake, Supplement acceptance, Weight trends, Labs, I & O's  REASON FOR ASSESSMENT:   Malnutrition Screening Tool, Consult Assessment of nutrition requirement/status  ASSESSMENT:   67 year old man with past medical history relevant for stage IV adenocarcinoma of the rectums/p failure of FOLFOX chemotherapy, being evaluated at [facility] for targeted therapy, s/p diverting loop transverse colostomy and omentectomy on 03/25/2018 at [facility]. He was admitted with diffuse anasarca, lower extremity edema, SOB and mildly elevated LFTs.  Estimated nutrition needs based on weight from 10/2 (91.5 kg) as weight significantly up for patient with dx of anasarca and BLE edema. No intakes documented since admission. Visualized breakfast tray with 50-75% completion of grits, bacon, and scrambled eggs, 100% completion of coffee, orange juice, and cranberry juice.   Patient states good appetite at a baseline, but that he had ostomy placed 2 weeks ago and since that time has had decreased appetite which is beginning to return. He reports no overt issues with ostomy and is unaware of any foods that cause discomfort or changes in stools. He denies abdominal pain or nausea with PO intakes.   He reports that after being d/c'ed from [facility]  he was walking 12-18 laps around his house/day but on the day of admission he could not even make it across the house.  Talked with patient about increased protein needs and provided handouts and estimated protein goal. He was drinking Boost Plus every other day at home and is willing to try Premier Protein and to consume supplements BID to meet protein need. He does not like Ensure.    Medications reviewed; 20 mg IV Lasix x1 dose yesterday, 60 mg IV Lasix BID. Labs reviewed; Alk Phos elevated, LFTs elevated.      NUTRITION - FOCUSED PHYSICAL EXAM:  Completed; no muscle or fat wasting, but noted moderate to severe edema to BLE.  Diet Order:   Diet Order            Diet regular Room service appropriate? Yes; Fluid consistency: Thin  Diet effective now              EDUCATION NEEDS:   Education needs have been addressed  Skin:  Skin Assessment: Reviewed RN Assessment  Last BM:  PTA/unknown  Height:   Ht Readings from Last 1 Encounters:  04/04/18 '6\' 3"'  (1.905 m)    Weight:   Wt Readings from Last 1 Encounters:  04/05/18 100.3 kg    Ideal Body Weight:  89.91 kg  BMI:  Body mass index is 27.64 kg/m.  Estimated Nutritional Needs:   Kcal:  2400-2600  Protein:  >/= 120 grams  Fluid:  per MD given anasarca, edema     Jarome Matin, MS, RD, LDN, CNSC Inpatient Clinical Dietitian Pager # [phone] After hours/weekend pager # [phone]

## 2018-04-06 LAB — CBC
HCT: 26.1 % — ABNORMAL LOW (ref 39.0–52.0)
Hemoglobin: 8.1 g/dL — ABNORMAL LOW (ref 13.0–17.0)
MCH: 30.5 pg (ref 26.0–34.0)
MCHC: 31 g/dL (ref 30.0–36.0)
MCV: 98.1 fL (ref 80.0–100.0)
Platelets: 178 K/uL (ref 150–400)
RBC: 2.66 MIL/uL — ABNORMAL LOW (ref 4.22–5.81)
RDW: 16.7 % — ABNORMAL HIGH (ref 11.5–15.5)
WBC: 5.9 10*3/uL (ref 4.0–10.5)
nRBC: 0 % (ref 0.0–0.2)

## 2018-04-06 LAB — COMPREHENSIVE METABOLIC PANEL
ALT: 50 U/L — ABNORMAL HIGH (ref 0–44)
Albumin: 2.2 g/dL — ABNORMAL LOW (ref 3.5–5.0)
Alkaline Phosphatase: 342 U/L — ABNORMAL HIGH (ref 38–126)
Anion gap: 8 (ref 5–15)
BUN: 18 mg/dL (ref 8–23)
Chloride: 100 mmol/L (ref 98–111)
Creatinine, Ser: 1.08 mg/dL (ref 0.61–1.24)
GFR calc non Af Amer: >60 mL/min (ref >60)
Potassium: 4 mmol/L (ref 3.5–5.1)
Total Bilirubin: 1.4 mg/dL — ABNORMAL HIGH (ref 0.3–1.2)
Total Protein: 5.9 g/dL — ABNORMAL LOW (ref 6.5–8.1)

## 2018-04-06 LAB — COMPREHENSIVE METABOLIC PANEL WITH GFR
AST: 69 U/L — ABNORMAL HIGH (ref 15–41)
CO2: 26 mmol/L (ref 22–32)
Calcium: 9 mg/dL (ref 8.9–10.3)
GFR calc Af Amer: >60 mL/min (ref >60)
Glucose, Bld: 109 mg/dL — ABNORMAL HIGH (ref 70–99)
Sodium: 134 mmol/L — ABNORMAL LOW (ref 135–145)

## 2018-04-06 LAB — MAGNESIUM: Magnesium: 2 mg/dL (ref 1.7–2.4)

## 2018-04-06 LAB — PROCALCITONIN: Procalcitonin: 0.19 ng/mL

## 2018-04-06 LAB — HIV ANTIBODY (ROUTINE TESTING W REFLEX): HIV SCREEN 4TH GENERATION: NONREACTIVE

## 2018-04-06 MED ORDER — FUROSEMIDE 10 MG/ML IJ SOLN
80.0000 mg | Freq: Four times a day (QID) | INTRAMUSCULAR | Status: DC
  Administered 2018-04-06 – 2018-04-07 (×3): 80 mg via INTRAVENOUS
  Filled 2018-04-06 (×3): qty 8

## 2018-04-06 NOTE — Progress Notes (Signed)
PROGRESS NOTE    Nicholas Suarez  KLK:[id number] DOB: 1951-01-24 DOA: 04/04/2018 PCP: Janith Lima, MD   Brief Narrative:  67 year old man with past medical history relevant for stage IV adenocarcinoma of the rectum Status post failure of FOLFOX chemotherapy, being evaluated at [facility] for targeted therapy, status post diverting loop transverse colostomy and omentectomy on 03/25/2018 at [facility] admitted with diffuse anasarca, lower extremity edema, shortness of breath and mildly elevated LFTs.   Assessment & Plan:   Active Problems:   Anasarca   #) Fluid overload/anasarca: Improving however patient is only net negative about 1 L and continues to have diffuse edema.  He is however of oxygen -Increase furosemide to 80 mg every 6 hours IV -Strict ins and outs, weigh daily - echo on 04/05/2018 shows normal ejection fraction  #) Elevated LFTs: Mixed hepatocellular and cholestatic, somewhat improving.  Almost certainly related to congestive hepatopathy in the setting of fluid overload. -Liver ultrasound on 04/05/2018 shows no evidence of portal venous thrombus or portal hypertension or hepatic venoocclusive disease however some mild degree of ductal dilatation and ascites -Repeat LFTs, bilirubin is only 1.4, will hold on MRCP at this time  #) Stage IV rectal adenocarcinoma: Patient apparently is failed chemotherapy with FOLFOX.  He is pending evaluation for additional treatment at [facility].  He did undergo a loop transverse colostomy and omentectomy with placement of a ostomy due to obstruction.  Fluids: Restrict Electrolytes: Monitor and supplement Nutrition: Regular diet  prophylaxis: Enoxaparin  Disposition: Pending resolution of edema  Full code    Consultants:   None  Procedures:   Echo 04/05/2018: Pending  Antimicrobials:   IV cefepime and vancomycin started 04/04/2018   Subjective: Patient reports he is feeling significantly better in  terms of his breathing but does not have much in the way of improvement of his edema.  He denies any chest pain, nausea, vomiting, diarrhea, cough, congestion.  Objective: Vitals:   04/05/18 2034 04/05/18 2300 04/05/18 2326 04/06/18 0418  BP:  130/78 122/78 105/69  Pulse:  (!) 106 100 (!) 103  Resp:  19 18 16   Temp: 98.9 F (37.2 C)  97.8 F (36.6 C) 97.9 F (36.6 C)  TempSrc: Oral  Oral Oral  SpO2:  91% 97% 98%  Weight:   101.1 kg 100.8 kg  Height:   6\' 3"  (1.905 m)     Intake/Output Summary (Last 24 hours) at 04/06/2018 1158 Last data filed at 04/06/2018 0600 Gross per 24 hour  Intake 396.94 ml  Output 1070 ml  Net -673.06 ml   Filed Weights   04/05/18 0300 04/05/18 2326 04/06/18 0418  Weight: 100.3 kg 101.1 kg 100.8 kg    Examination:  General exam: Appears calm and comfortable  Respiratory system: No increased work of breathing, diminished lung sounds at bases, no wheezes, crackles, rhonchi Cardiovascular system: Distant breath sounds, regular rate and rhythm, no murmurs. Gastrointestinal system: Soft, abdominal wall edema noted, diminished bowel sounds, no rebound or guarding, nontender, ostomy site is clean dry and intact with stool in bag Central nervous system: Alert and oriented. No focal neurological deficits. Extremities: 3+ lower extremity edema Skin: Well-healed midline incision Psychiatry: Judgement and insight appear normal. Mood & affect appropriate.     Data Reviewed: I have personally reviewed following labs and imaging studies  CBC: Recent Labs  Lab 04/04/18 1526 04/05/18 1115 04/06/18 0316  WBC 6.4 5.9 5.9  HGB 8.5* 8.4* 8.1*  HCT 26.9* 27.1*  26.1*  MCV 97.1 99.6 98.1  PLT 179 173 161   Basic Metabolic Panel: Recent Labs  Lab 04/04/18 1529 04/04/18 2215 04/04/18 2238 04/05/18 1115 04/06/18 0316  NA 135  --   --  134* 134*  K 4.3  --   --  4.1 4.0  CL 103  --   --  102 100  CO2 24  --   --  27 26  GLUCOSE 112*  --   --  111* 109*   BUN 18  --   --  17 18  CREATININE 1.05 1.10  --  1.23 1.08  CALCIUM 8.9  --   --  9.0 9.0  MG  --   --  2.2  --  2.0   GFR: Estimated Creatinine Clearance: 79.3 mL/min (by C-G formula based on SCr of 1.08 mg/dL). Liver Function Tests: Recent Labs  Lab 04/04/18 1529 04/05/18 1115 04/06/18 0316  AST 102* 79* 69*  ALT 69* 57* 50*  ALKPHOS 470* 390* 342*  BILITOT 1.7* 1.6* 1.4*  PROT 6.2* 6.1* 5.9*  ALBUMIN 2.4* 2.4* 2.2*   No results for input(s): LIPASE, AMYLASE in the last 168 hours. No results for input(s): AMMONIA in the last 168 hours. Coagulation Profile: No results for input(s): INR, PROTIME in the last 168 hours. Cardiac Enzymes: No results for input(s): CKTOTAL, CKMB, CKMBINDEX, TROPONINI in the last 168 hours. BNP (last 3 results) No results for input(s): PROBNP in the last 8760 hours. HbA1C: No results for input(s): HGBA1C in the last 72 hours. CBG: No results for input(s): GLUCAP in the last 168 hours. Lipid Profile: No results for input(s): CHOL, HDL, LDLCALC, TRIG, CHOLHDL, LDLDIRECT in the last 72 hours. Thyroid Function Tests: No results for input(s): TSH, T4TOTAL, FREET4, T3FREE, THYROIDAB in the last 72 hours. Anemia Panel: No results for input(s): VITAMINB12, FOLATE, FERRITIN, TIBC, IRON, RETICCTPCT in the last 72 hours. Sepsis Labs: Recent Labs  Lab 04/05/18 1115 04/06/18 0316  PROCALCITON 0.16 0.19    Recent Results (from the past 240 hour(s))  Culture, blood (Routine X 2) w Reflex to ID Panel     Status: None (Preliminary result)   Collection Time: 04/04/18 10:18 PM  Result Value Ref Range Status   Specimen Description   Final    BLOOD RIGHT ANTECUBITAL Performed at [facility] [address], [place], [place] [address]    Special Requests   Final    BOTTLES DRAWN AEROBIC AND ANAEROBIC Blood Culture adequate volume Performed at [facility] [address], [place], [place] [address]    Culture    Final    NO GROWTH 1 DAY Performed at [facility], [place] [address] [place], [place] [address]    Report Status PENDING  Incomplete  Culture, blood (Routine X 2) w Reflex to ID Panel     Status: None (Preliminary result)   Collection Time: 04/04/18 10:18 PM  Result Value Ref Range Status   Specimen Description   Final    BLOOD LEFT HAND Performed at [facility], [place] [address], [place], [place] [address]    Special Requests   Final    BOTTLES DRAWN AEROBIC AND ANAEROBIC Blood Culture adequate volume Performed at [facility] [address], [place], [place] [address]    Culture   Final    NO GROWTH 1 DAY Performed at [facility], [place] [address] [place], [place] [address]  Report Status PENDING  Incomplete  MRSA PCR Screening     Status: None   Collection Time: 04/05/18  7:53 AM  Result Value Ref Range Status   MRSA by PCR NEGATIVE NEGATIVE Final    Comment:        The GeneXpert MRSA Assay (FDA approved for NASAL specimens only), is one component of a comprehensive MRSA colonization surveillance program. It is not intended to diagnose MRSA infection nor to guide or monitor treatment for MRSA infections. Performed at [facility], [place] [address] [place], [place] [address]          Radiology Studies: Dg Chest 2 View  Result Date: 04/04/2018 CLINICAL DATA:  Shortness of Breath EXAM: CHEST - 2 VIEW COMPARISON:  Chest radiograph December 10, 2017 and chest CT February 14, 2018 FINDINGS: Port-A-Cath tip is in the superior vena cava near the cavoatrial junction. There is patchy infiltrate in the right base with small right pleural effusion. There is a small left pleural effusion with slight left base atelectasis. Heart size and pulmonary vascularity are within normal limits. Mild prominence in the right perihilar region is stable. Recent CT did show adenopathy in the right hilar region. No other adenopathy appreciable.  No bone lesions. IMPRESSION: 1. Small pleural effusions bilaterally. Patchy consolidation posterior right base. Mild atelectasis left base. 2.  Stable cardiac silhouette. 3.  Mild right hilar adenopathy, better appreciable on recent CT. 4.  Port-A-Cath tip in superior vena cava.  No pneumothorax. Electronically Signed   By: Lowella Grip III M.D.   On: 04/04/2018 16:30   Ct Angio Chest Pe W And/or Wo Contrast  Result Date: 04/04/2018 CLINICAL DATA:  Shortness of breath and abdominal distention. History of rectal cancer. EXAM: CT ANGIOGRAPHY CHEST CT ABDOMEN AND PELVIS WITH CONTRAST TECHNIQUE: Multidetector CT imaging of the chest was performed using the standard protocol during bolus administration of intravenous contrast. Multiplanar CT image reconstructions and MIPs were obtained to evaluate the vascular anatomy. Multidetector CT imaging of the abdomen and pelvis was performed using the standard protocol during bolus administration of intravenous contrast. CONTRAST:  173mL ISOVUE-370 IOPAMIDOL (ISOVUE-370) INJECTION 76% COMPARISON:  CT 02/14/2018 FINDINGS: CTA CHEST FINDINGS Cardiovascular: The heart is normal in size. No pericardial effusion. The aorta is normal in caliber. No dissection. The pulmonary arterial tree is fairly well opacified. No definite filling defects to suggest pulmonary embolism. Mediastinum/Nodes: Extensive and progressive mediastinal and hilar lymphadenopathy. Lungs/Pleura: New bilateral pleural effusions larger on the right and moderate on the left. There is also overlying atelectasis. Interstitial pulmonary edema is noted but no definite infiltrates or metastatic pulmonary nodules. Musculoskeletal: No chest wall mass. Suspect right-sided supraclavicular adenopathy. The Port-A-Cath is stable. Stable osseous metastatic disease. Review of the MIP images confirms the above findings. CT ABDOMEN and PELVIS FINDINGS Hepatobiliary: No obvious hepatic metastatic lesions. Moderate  periportal edema and mild intrahepatic biliary dilatation. No common bile duct dilatation. Gallbladder is grossly normal. Stable perihepatic fluid. Pancreas: No mass, inflammation or ductal dilatation. Spleen: Normal size.  No focal lesions. Adrenals/Urinary Tract: Stable left adrenal gland lesion. The right adrenal gland is unremarkable. Both kidneys demonstrate poor perfusion renal cortical thinning. Left renal cyst is noted. No hydronephrosis. Uniform enhancing bladder wall thickening. Stomach/Bowel: The stomach, duodenum, small bowel and colon are grossly normal. Suspect gastric wall thickening. Transverse colostomy noted. Vascular/Lymphatic: Enlarged celiac axis and hepatoduodenal ligament lymph nodes. Ill-defined retroperitoneal soft tissue density, likely tumor. No significant change. Reproductive: The prostate gland and seminal vesicles  are unremarkable. Other: Diffuse body wall edema suggesting anasarca. Is also small volume abdominal/pelvic ascites, presacral edema and large bilateral hydroceles and marked scrotal swelling. Musculoskeletal: Stable scattered sclerotic bone lesions. Review of the MIP images confirms the above findings. IMPRESSION: 1. No CT findings for pulmonary embolism. 2. New sizable bilateral pleural effusions with overlying atelectasis. 3. Progressive mediastinal and hilar lymphadenopathy. 4. Marked body wall edema/anasarca. Small volume abdominal/pelvic ascites. 5. Stable to slightly progressive abdominal lymphadenopathy. 6. Fairly marked periportal edema in the liver. 7. Stable osseous lesions. Electronically Signed   By: Marijo Sanes M.D.   On: 04/04/2018 19:35   Ct Abdomen Pelvis W Contrast  Result Date: 04/04/2018 CLINICAL DATA:  Shortness of breath and abdominal distention. History of rectal cancer. EXAM: CT ANGIOGRAPHY CHEST CT ABDOMEN AND PELVIS WITH CONTRAST TECHNIQUE: Multidetector CT imaging of the chest was performed using the standard protocol during bolus  administration of intravenous contrast. Multiplanar CT image reconstructions and MIPs were obtained to evaluate the vascular anatomy. Multidetector CT imaging of the abdomen and pelvis was performed using the standard protocol during bolus administration of intravenous contrast. CONTRAST:  150mL ISOVUE-370 IOPAMIDOL (ISOVUE-370) INJECTION 76% COMPARISON:  CT 02/14/2018 FINDINGS: CTA CHEST FINDINGS Cardiovascular: The heart is normal in size. No pericardial effusion. The aorta is normal in caliber. No dissection. The pulmonary arterial tree is fairly well opacified. No definite filling defects to suggest pulmonary embolism. Mediastinum/Nodes: Extensive and progressive mediastinal and hilar lymphadenopathy. Lungs/Pleura: New bilateral pleural effusions larger on the right and moderate on the left. There is also overlying atelectasis. Interstitial pulmonary edema is noted but no definite infiltrates or metastatic pulmonary nodules. Musculoskeletal: No chest wall mass. Suspect right-sided supraclavicular adenopathy. The Port-A-Cath is stable. Stable osseous metastatic disease. Review of the MIP images confirms the above findings. CT ABDOMEN and PELVIS FINDINGS Hepatobiliary: No obvious hepatic metastatic lesions. Moderate periportal edema and mild intrahepatic biliary dilatation. No common bile duct dilatation. Gallbladder is grossly normal. Stable perihepatic fluid. Pancreas: No mass, inflammation or ductal dilatation. Spleen: Normal size.  No focal lesions. Adrenals/Urinary Tract: Stable left adrenal gland lesion. The right adrenal gland is unremarkable. Both kidneys demonstrate poor perfusion renal cortical thinning. Left renal cyst is noted. No hydronephrosis. Uniform enhancing bladder wall thickening. Stomach/Bowel: The stomach, duodenum, small bowel and colon are grossly normal. Suspect gastric wall thickening. Transverse colostomy noted. Vascular/Lymphatic: Enlarged celiac axis and hepatoduodenal ligament lymph  nodes. Ill-defined retroperitoneal soft tissue density, likely tumor. No significant change. Reproductive: The prostate gland and seminal vesicles are unremarkable. Other: Diffuse body wall edema suggesting anasarca. Is also small volume abdominal/pelvic ascites, presacral edema and large bilateral hydroceles and marked scrotal swelling. Musculoskeletal: Stable scattered sclerotic bone lesions. Review of the MIP images confirms the above findings. IMPRESSION: 1. No CT findings for pulmonary embolism. 2. New sizable bilateral pleural effusions with overlying atelectasis. 3. Progressive mediastinal and hilar lymphadenopathy. 4. Marked body wall edema/anasarca. Small volume abdominal/pelvic ascites. 5. Stable to slightly progressive abdominal lymphadenopathy. 6. Fairly marked periportal edema in the liver. 7. Stable osseous lesions. Electronically Signed   By: Marijo Sanes M.D.   On: 04/04/2018 19:35   US Liver Doppler  Result Date: 04/05/2018 CLINICAL DATA:  Elevated liver function tests. History of adenocarcinoma of the rectum. EXAM: DUPLEX ULTRASOUND OF LIVER TECHNIQUE: Color and duplex Doppler ultrasound was performed to evaluate the hepatic in-flow and out-flow vessels. COMPARISON:  CT of the abdomen on 04/04/2016 FINDINGS: Portal Vein Velocities Main:  79-94 cm/sec Right:  65 cm/sec Left:  62 cm/sec Hepatic Vein Velocities Right:  89 cm/sec Middle:  103 cm/sec Left:  79 cm/sec Hepatic Artery Velocity:  86 cm/sec Splenic Vein Velocity:  44 cm/sec Varices: None identified. Ascites: Small amount of scattered ascites is seen primarily around the liver. The portal vein is open and demonstrates normal hepatofugal flow. No evidence of portal vein thrombosis. There is evidence of at least mild biliary ductal dilatation with the upper proper hepatic bile duct measuring approximately 8 mm in diameter. No filling defects identified in the visualized biliary tree. Portal vein and hepatic venous waveforms appear  unremarkable. The spleen is not enlarged. Bilateral pleural fluid visualized. IMPRESSION: 1. No evidence of portal vein thrombosis or significant reduction in portal vein velocities to suggest portal hypertension. 2. No evidence of hepatic veno-occlusive disease. 3. Evidence of some degree of biliary ductal dilatation. Small volume of ascites, predominantly around the liver. Electronically Signed   By: Aletta Edouard M.D.   On: 04/05/2018 17:19        Scheduled Meds: . Chlorhexidine Gluconate Cloth  6 each Topical Daily  . enoxaparin (LOVENOX) injection  40 mg Subcutaneous Q24H  . furosemide  80 mg Intravenous Q6H  . lactose free nutrition  237 mL Oral Q24H  . protein supplement shake  11 oz Oral Q24H  . sodium chloride flush  3 mL Intravenous Q12H   Continuous Infusions: . sodium chloride Stopped (04/05/18 2303)     LOS: 2 days    Time spent: [place], MD [facility]  If 7PM-7AM, please contact night-coverage [email or web address] Password [email or web address] 04/06/2018, 11:58 AM

## 2018-04-07 LAB — HEPATIC FUNCTION PANEL
ALT: 45 U/L — ABNORMAL HIGH (ref 0–44)
AST: 66 U/L — ABNORMAL HIGH (ref 15–41)
Albumin: 2.2 g/dL — ABNORMAL LOW (ref 3.5–5.0)
Alkaline Phosphatase: 318 U/L — ABNORMAL HIGH (ref 38–126)
Bilirubin, Direct: 0.4 mg/dL — ABNORMAL HIGH (ref 0.0–0.2)
Indirect Bilirubin: 0.8 mg/dL (ref 0.3–0.9)
Total Bilirubin: 1.2 mg/dL (ref 0.3–1.2)
Total Protein: 5.9 g/dL — ABNORMAL LOW (ref 6.5–8.1)

## 2018-04-07 LAB — BASIC METABOLIC PANEL WITH GFR
Anion gap: 7 (ref 5–15)
Calcium: 8.9 mg/dL (ref 8.9–10.3)
Creatinine, Ser: 1.11 mg/dL (ref 0.61–1.24)
GFR calc Af Amer: >60 mL/min (ref >60)
GFR calc non Af Amer: >60 mL/min (ref >60)
Sodium: 133 mmol/L — ABNORMAL LOW (ref 135–145)

## 2018-04-07 LAB — CBC
HCT: 25.5 % — ABNORMAL LOW (ref 39.0–52.0)
Hemoglobin: 8 g/dL — ABNORMAL LOW (ref 13.0–17.0)
MCH: 30.8 pg (ref 26.0–34.0)
MCHC: 31.4 g/dL (ref 30.0–36.0)
MCV: 98.1 fL (ref 80.0–100.0)
Platelets: 184 10*3/uL (ref 150–400)
RBC: 2.6 MIL/uL — ABNORMAL LOW (ref 4.22–5.81)
RDW: 16.7 % — ABNORMAL HIGH (ref 11.5–15.5)
WBC: 5.7 10*3/uL (ref 4.0–10.5)
nRBC: 0 % (ref 0.0–0.2)

## 2018-04-07 LAB — BASIC METABOLIC PANEL
BUN: 20 mg/dL (ref 8–23)
CO2: 28 mmol/L (ref 22–32)
Chloride: 98 mmol/L (ref 98–111)
Glucose, Bld: 103 mg/dL — ABNORMAL HIGH (ref 70–99)
Potassium: 3.6 mmol/L (ref 3.5–5.1)

## 2018-04-07 LAB — MAGNESIUM: Magnesium: 1.9 mg/dL (ref 1.7–2.4)

## 2018-04-07 LAB — PROCALCITONIN: Procalcitonin: 0.16 ng/mL

## 2018-04-07 MED ORDER — FUROSEMIDE 10 MG/ML IJ SOLN
80.0000 mg | Freq: Two times a day (BID) | INTRAMUSCULAR | Status: DC
  Administered 2018-04-07 – 2018-04-08 (×2): 80 mg via INTRAVENOUS
  Filled 2018-04-07 (×2): qty 8

## 2018-04-07 MED ORDER — METOLAZONE 5 MG PO TABS
5.0000 mg | ORAL_TABLET | Freq: Once | ORAL | Status: AC
  Administered 2018-04-07: 5 mg via ORAL
  Filled 2018-04-07: qty 1

## 2018-04-07 MED ORDER — POLYETHYLENE GLYCOL 3350 17 G PO PACK
17.0000 g | PACK | Freq: Two times a day (BID) | ORAL | Status: DC | PRN
  Administered 2018-04-07: 17 g via ORAL
  Filled 2018-04-07: qty 1

## 2018-04-07 NOTE — Progress Notes (Signed)
PROGRESS NOTE    Nicholas Suarez  GBT:[id number] DOB: 10-Nov-1950 DOA: 04/04/2018 PCP: Janith Lima, MD   Brief Narrative:  67 year old man with past medical history relevant for stage IV adenocarcinoma of the rectum Status post failure of FOLFOX chemotherapy, being evaluated at [facility] for targeted therapy, status post diverting loop transverse colostomy and omentectomy on 03/25/2018 at [facility] admitted with diffuse anasarca, lower extremity edema, shortness of breath and mildly elevated LFTs.   Assessment & Plan:   Active Problems:   Anasarca   #) Fluid overload/anasarca: Improving with net negative almost 2.5 L however patient continues to have edema, his oxygen requirement is resolved -decrease furosemide to 80 mg every 12 hours IV -We will give 1 dose of metolazone 5 mg -Strict ins and outs, weigh daily - echo on 04/05/2018 shows normal ejection fraction  #) Elevated LFTs: Mixed hepatocellular and cholestatic, somewhat improving.  Likely related to congestive hepatopathy with possible contributions from metastatic disease due to adenocarcinoma. -Liver ultrasound on 04/05/2018 shows no evidence of portal venous thrombus or portal hypertension or hepatic venoocclusive disease however some mild degree of ductal dilatation and ascites -Repeat LFTs, bilirubin is only 1.4, will hold on MRCP at this time  #) Stage IV rectal adenocarcinoma: Patient apparently is failed chemotherapy with FOLFOX.  He is pending evaluation for additional treatment at [facility].  He did undergo a loop transverse colostomy and omentectomy with placement of a ostomy due to obstruction.  Fluids: Restrict Electrolytes: Monitor and supplement Nutrition: Regular diet  prophylaxis: Enoxaparin  Disposition: Pending resolution of edema  Full code    Consultants:   None  Procedures:   Echo 04/05/2018: Pending  Antimicrobials:   IV cefepime and vancomycin started 04/04/2018  to 04/05/2018   Subjective: Patient reports he is doing better with some improvement in his lower extremity edema.  He denies any cough, congestion, chest pain, nausea, vomiting, diarrhea.  Objective: Vitals:   04/06/18 1317 04/06/18 2120 04/07/18 0551 04/07/18 0700  BP: 124/70 125/76 117/71   Pulse: (!) 101 (!) 106 96   Resp: 19 19 18    Temp: 97.6 F (36.4 C) 97.7 F (36.5 C) 98.1 F (36.7 C)   TempSrc: Oral Oral Oral   SpO2: 93% 92% 92%   Weight:   101.2 kg 96 kg  Height:        Intake/Output Summary (Last 24 hours) at 04/07/2018 1115 Last data filed at 04/07/2018 0552 Gross per 24 hour  Intake -  Output 1800 ml  Net -1800 ml   Filed Weights   04/06/18 0418 04/07/18 0551 04/07/18 0700  Weight: 100.8 kg 101.2 kg 96 kg    Examination:  General exam: Appears calm and comfortable  Respiratory system: No increased work of breathing, diminished lung sounds at bases, no wheezes, crackles, rhonchi Cardiovascular system: Distant heart sounds, regular rate and rhythm, no murmurs. Gastrointestinal system: Soft, abdominal wall edema noted, plus bowel sounds, no rebound or guarding, nontender, ostomy site is clean dry and intact with stool in bag Central nervous system: Alert and oriented. No focal neurological deficits. Extremities: 1+ lower extremity edema up to mid shin only Skin: Well-healed midline incision Psychiatry: Judgement and insight appear normal. Mood & affect appropriate.     Data Reviewed: I have personally reviewed following labs and imaging studies  CBC: Recent Labs  Lab 04/04/18 1526 04/05/18 1115 04/06/18 0316 04/07/18 0541  WBC 6.4 5.9 5.9 5.7  HGB 8.5* 8.4* 8.1* 8.0*  HCT 26.9* 27.1* 26.1* 25.5*  MCV 97.1 99.6 98.1 98.1  PLT 179 173 178 782   Basic Metabolic Panel: Recent Labs  Lab 04/04/18 1529 04/04/18 2215 04/04/18 2238 04/05/18 1115 04/06/18 0316 04/07/18 0541  NA 135  --   --  134* 134* 133*  K 4.3  --   --  4.1 4.0 3.6  CL 103   --   --  102 100 98  CO2 24  --   --  27 26 28   GLUCOSE 112*  --   --  111* 109* 103*  BUN 18  --   --  17 18 20   CREATININE 1.05 1.10  --  1.23 1.08 1.11  CALCIUM 8.9  --   --  9.0 9.0 8.9  MG  --   --  2.2  --  2.0 1.9   GFR: Estimated Creatinine Clearance: 77.2 mL/min (by C-G formula based on SCr of 1.11 mg/dL). Liver Function Tests: Recent Labs  Lab 04/04/18 1529 04/05/18 1115 04/06/18 0316 04/07/18 0541  AST 102* 79* 69* 66*  ALT 69* 57* 50* 45*  ALKPHOS 470* 390* 342* 318*  BILITOT 1.7* 1.6* 1.4* 1.2  PROT 6.2* 6.1* 5.9* 5.9*  ALBUMIN 2.4* 2.4* 2.2* 2.2*   No results for input(s): LIPASE, AMYLASE in the last 168 hours. No results for input(s): AMMONIA in the last 168 hours. Coagulation Profile: No results for input(s): INR, PROTIME in the last 168 hours. Cardiac Enzymes: No results for input(s): CKTOTAL, CKMB, CKMBINDEX, TROPONINI in the last 168 hours. BNP (last 3 results) No results for input(s): PROBNP in the last 8760 hours. HbA1C: No results for input(s): HGBA1C in the last 72 hours. CBG: No results for input(s): GLUCAP in the last 168 hours. Lipid Profile: No results for input(s): CHOL, HDL, LDLCALC, TRIG, CHOLHDL, LDLDIRECT in the last 72 hours. Thyroid Function Tests: No results for input(s): TSH, T4TOTAL, FREET4, T3FREE, THYROIDAB in the last 72 hours. Anemia Panel: No results for input(s): VITAMINB12, FOLATE, FERRITIN, TIBC, IRON, RETICCTPCT in the last 72 hours. Sepsis Labs: Recent Labs  Lab 04/05/18 1115 04/06/18 0316 04/07/18 0541  PROCALCITON 0.16 0.19 0.16    Recent Results (from the past 240 hour(s))  Culture, blood (Routine X 2) w Reflex to ID Panel     Status: None (Preliminary result)   Collection Time: 04/04/18 10:18 PM  Result Value Ref Range Status   Specimen Description   Final    BLOOD RIGHT ANTECUBITAL Performed at [facility], [place] [address], [place], [place] [address]    Special Requests   Final     BOTTLES DRAWN AEROBIC AND ANAEROBIC Blood Culture adequate volume Performed at [place] [address], [place], [place] [address]    Culture   Final    NO GROWTH 1 DAY Performed at [facility], [place] [address] [place], [place] [address]    Report Status PENDING  Incomplete  Culture, blood (Routine X 2) w Reflex to ID Panel     Status: None (Preliminary result)   Collection Time: 04/04/18 10:18 PM  Result Value Ref Range Status   Specimen Description   Final    BLOOD LEFT HAND Performed at [facility], [place] [address] [place], [place] [address]    Special Requests   Final    BOTTLES DRAWN AEROBIC AND ANAEROBIC Blood Culture adequate volume Performed at [place] [address] [place], [place] [address]  Culture   Final    NO GROWTH 1 DAY Performed at [facility], [place] [address] [place], [place] [address]    Report Status PENDING  Incomplete  MRSA PCR Screening     Status: None   Collection Time: 04/05/18  7:53 AM  Result Value Ref Range Status   MRSA by PCR NEGATIVE NEGATIVE Final    Comment:        The GeneXpert MRSA Assay (FDA approved for NASAL specimens only), is one component of a comprehensive MRSA colonization surveillance program. It is not intended to diagnose MRSA infection nor to guide or monitor treatment for MRSA infections. Performed at [facility], [place] [address], [place], [place] [address]          Radiology Studies: US Liver Doppler  Result Date: 04/05/2018 CLINICAL DATA:  Elevated liver function tests. History of adenocarcinoma of the rectum. EXAM: DUPLEX ULTRASOUND OF LIVER TECHNIQUE: Color and duplex Doppler ultrasound was performed to evaluate the hepatic in-flow and out-flow vessels. COMPARISON:  CT of the abdomen on 04/04/2016 FINDINGS: Portal Vein Velocities Main:  79-94 cm/sec Right:  65 cm/sec Left:  62 cm/sec Hepatic Vein Velocities Right:  89  cm/sec Middle:  103 cm/sec Left:  79 cm/sec Hepatic Artery Velocity:  86 cm/sec Splenic Vein Velocity:  44 cm/sec Varices: None identified. Ascites: Small amount of scattered ascites is seen primarily around the liver. The portal vein is open and demonstrates normal hepatofugal flow. No evidence of portal vein thrombosis. There is evidence of at least mild biliary ductal dilatation with the upper proper hepatic bile duct measuring approximately 8 mm in diameter. No filling defects identified in the visualized biliary tree. Portal vein and hepatic venous waveforms appear unremarkable. The spleen is not enlarged. Bilateral pleural fluid visualized. IMPRESSION: 1. No evidence of portal vein thrombosis or significant reduction in portal vein velocities to suggest portal hypertension. 2. No evidence of hepatic veno-occlusive disease. 3. Evidence of some degree of biliary ductal dilatation. Small volume of ascites, predominantly around the liver. Electronically Signed   By: Aletta Edouard M.D.   On: 04/05/2018 17:19        Scheduled Meds: . Chlorhexidine Gluconate Cloth  6 each Topical Daily  . enoxaparin (LOVENOX) injection  40 mg Subcutaneous Q24H  . furosemide  80 mg Intravenous BID  . lactose free nutrition  237 mL Oral Q24H  . protein supplement shake  11 oz Oral Q24H  . sodium chloride flush  3 mL Intravenous Q12H   Continuous Infusions: . sodium chloride Stopped (04/05/18 2303)     LOS: 3 days    Time spent: Baker, MD [facility]  If 7PM-7AM, please contact night-coverage [email or web address] Password [email or web address] 04/07/2018, 11:15 AM

## 2018-04-08 ENCOUNTER — Other Ambulatory Visit: Payer: Medicare Other

## 2018-04-08 ENCOUNTER — Telehealth: Payer: Self-pay

## 2018-04-08 ENCOUNTER — Inpatient Hospital Stay (HOSPITAL_BASED_OUTPATIENT_CLINIC_OR_DEPARTMENT_OTHER): Payer: Medicare Other | Admitting: Oncology

## 2018-04-08 ENCOUNTER — Ambulatory Visit: Payer: Medicare Other

## 2018-04-08 ENCOUNTER — Inpatient Hospital Stay: Payer: Medicare Other

## 2018-04-08 ENCOUNTER — Ambulatory Visit: Payer: Medicare Other | Admitting: Nurse Practitioner

## 2018-04-08 VITALS — BP 121/66 | HR 93 | Temp 97.6°F | Resp 18 | Ht 75.0 in | Wt 209.9 lb

## 2018-04-08 DIAGNOSIS — M545 Low back pain: Secondary | ICD-10-CM

## 2018-04-08 DIAGNOSIS — C786 Secondary malignant neoplasm of retroperitoneum and peritoneum: Secondary | ICD-10-CM

## 2018-04-08 DIAGNOSIS — C20 Malignant neoplasm of rectum: Secondary | ICD-10-CM

## 2018-04-08 DIAGNOSIS — E8809 Other disorders of plasma-protein metabolism, not elsewhere classified: Secondary | ICD-10-CM

## 2018-04-08 DIAGNOSIS — C674 Malignant neoplasm of posterior wall of bladder: Secondary | ICD-10-CM | POA: Diagnosis not present

## 2018-04-08 DIAGNOSIS — C7989 Secondary malignant neoplasm of other specified sites: Secondary | ICD-10-CM

## 2018-04-08 DIAGNOSIS — R970 Elevated carcinoembryonic antigen [CEA]: Secondary | ICD-10-CM

## 2018-04-08 DIAGNOSIS — R911 Solitary pulmonary nodule: Secondary | ICD-10-CM

## 2018-04-08 DIAGNOSIS — R601 Generalized edema: Secondary | ICD-10-CM

## 2018-04-08 LAB — BASIC METABOLIC PANEL WITH GFR
BUN: 18 mg/dL (ref 8–23)
Chloride: 96 mmol/L — ABNORMAL LOW (ref 98–111)
Creatinine, Ser: 1.17 mg/dL (ref 0.61–1.24)
Sodium: 131 mmol/L — ABNORMAL LOW (ref 135–145)

## 2018-04-08 LAB — BASIC METABOLIC PANEL
Anion gap: 6 (ref 5–15)
CO2: 29 mmol/L (ref 22–32)
Calcium: 9 mg/dL (ref 8.9–10.3)
GFR calc Af Amer: >60 mL/min (ref >60)
GFR calc non Af Amer: >60 mL/min (ref >60)
Glucose, Bld: 107 mg/dL — ABNORMAL HIGH (ref 70–99)
Potassium: 3.2 mmol/L — ABNORMAL LOW (ref 3.5–5.1)

## 2018-04-08 LAB — MAGNESIUM: Magnesium: 1.9 mg/dL (ref 1.7–2.4)

## 2018-04-08 MED ORDER — OXYCODONE HCL 5 MG PO TABS: 5.0000 mg | ORAL_TABLET | ORAL | 0 refills | Status: DC | PRN

## 2018-04-08 MED ORDER — POTASSIUM CHLORIDE CRYS ER 20 MEQ PO TBCR
40.0000 meq | EXTENDED_RELEASE_TABLET | Freq: Once | ORAL | Status: AC
  Administered 2018-04-08: 40 meq via ORAL
  Filled 2018-04-08: qty 2

## 2018-04-08 MED ORDER — HEPARIN SOD (PORK) LOCK FLUSH 100 UNIT/ML IV SOLN
500.0000 [IU] | INTRAVENOUS | Status: DC | PRN
  Administered 2018-04-08: 500 [IU]

## 2018-04-08 MED ORDER — OXYCODONE HCL 5 MG PO TABS
5.0000 mg | ORAL_TABLET | ORAL | Status: DC | PRN
  Administered 2018-04-08: 5 mg via ORAL
  Filled 2018-04-08: qty 1

## 2018-04-08 MED ORDER — FUROSEMIDE 40 MG PO TABS: 40.0000 mg | ORAL_TABLET | Freq: Every day | ORAL | 0 refills | Status: DC | PRN

## 2018-04-08 MED ORDER — HEPARIN SOD (PORK) LOCK FLUSH 100 UNIT/ML IV SOLN: 500.0000 [IU] | INTRAVENOUS | Status: DC

## 2018-04-08 NOTE — Progress Notes (Signed)
Discharged Nicholas Suarez, reviewed medications, provided prescription. Advised patient to report to Dr. Learta Codding after leaving here, per doctor. Patient was accompanied by nurse tech out of hospital in a wheelchair.

## 2018-04-08 NOTE — Telephone Encounter (Signed)
Printed avs and calender of upcoming appointment. Per 10/28 los 

## 2018-04-08 NOTE — Care Management Important Message (Signed)
Important Message  Patient Details  Name: Nicholas Suarez MRN: [id number] Date of Birth: 27-Jan-1951   Medicare Important Message Given:  Yes    Kerin Salen 04/08/2018, 10:40 AMImportant Message  Patient Details  Name: Nicholas Suarez MRN: [id number] Date of Birth: [birth year]   Medicare Important Message Given:  Yes    Kerin Salen 04/08/2018, 10:40 AM

## 2018-04-08 NOTE — Discharge Summary (Signed)
Physician Discharge Summary  Nicholas Suarez KGM:[id number] DOB: Sep 13, 1950 DOA: 04/04/2018  PCP: Janith Lima, MD  Admit date: 04/04/2018 Discharge date: 04/08/2018  Admitted From: Home Disposition:  Home  Recommendations for Outpatient Follow-up:  1. Follow up with PCP in 1-2 weeks 2. Please obtain BMP/CBC in one week 3. Please take a dose of Lasix if your weight goes up by more than 5 pounds in 1 day.  Please call your primary care doctor if you take an extra dose of Lasix.  Home Health: No Equipment/Devices: No  Discharge Condition:stable CODE STATUS: FULL Diet recommendation: Regular  Brief/Interim Summary:  #) Fluid overload/anasarca: Patient was admitted with anasarca as well as small pleural effusions and hypoxia.  This was thought to be secondary to his recent surgery for his bowel obstruction where he received a loop transverse colostomy and omentectomy at [facility].  He did receive significant blood products and fluids.  Here he was given IV diuresis for several days with furosemide as well as PRN metolazone.  He did have a dramatic improvement in his lower extremity edema.  His oxygen requirement resolved.  He was discharged on PRN furosemide.  #) Elevated LFTs: Patient was noted to have mixed hepatocellular and cholestatic evidence of liver injury.  It was thought to be a combination of congestive hepatopathy with possible contribution from metastatic disease.  His liver ultrasound on 04/05/2018 showed no evidence of portal venous thrombus of portal hypertension or hepatic venoocclusive disease.  There is mild dilatation and ascites that were attributed to his fluid overload.  There is no evidence of choledocholithiasis.  #) Stage IV rectal adenocarcinoma: Patient apparently is failed chemotherapy with FOLFOX.  He is pending additional immunotherapy treatment at [facility].  He should follow-up with them as an outpatient.   Discharge Diagnoses:  Active Problems:    Anasarca    Discharge Instructions  Discharge Instructions    Call MD for:  difficulty breathing, headache or visual disturbances   Complete by:  As directed    Call MD for:  extreme fatigue   Complete by:  As directed    Call MD for:  hives   Complete by:  As directed    Call MD for:  persistant dizziness or light-headedness   Complete by:  As directed    Call MD for:  persistant nausea and vomiting   Complete by:  As directed    Call MD for:  redness, tenderness, or signs of infection (pain, swelling, redness, odor or green/yellow discharge around incision site)   Complete by:  As directed    Call MD for:  severe uncontrolled pain   Complete by:  As directed    Call MD for:  temperature >100.4   Complete by:  As directed    Diet - low sodium heart healthy   Complete by:  As directed    Discharge instructions   Complete by:  As directed    Please follow-up with your primary care doctor in 1 week.  Please take a dose of Lasix if your weight goes up by more than 5 pounds in 1 day.   Increase activity slowly   Complete by:  As directed      Allergies as of 04/08/2018      Reactions   Oxaliplatin Rash      Medication List    STOP taking these medications   meloxicam 15 MG tablet Commonly known as:  MOBIC   prochlorperazine 10 MG tablet Commonly  known as:  COMPAZINE     TAKE these medications   furosemide 40 MG tablet Commonly known as:  LASIX Take 1 tablet (40 mg total) by mouth daily as needed for fluid or edema (weight gain >5lbs over 1 day).   MIRALAX PO Take 17 g by mouth daily as needed (constipation).   ondansetron 4 MG tablet Commonly known as:  ZOFRAN Take 4 mg by mouth every 8 (eight) hours as needed for nausea or vomiting.   traMADol 50 MG tablet Commonly known as:  ULTRAM Take 1 tablet (50 mg total) by mouth every 6 (six) hours as needed for moderate pain.       Allergies  Allergen Reactions  . Oxaliplatin Rash     Consultations:  None   Procedures/Studies: Dg Chest 2 View  Result Date: 04/04/2018 CLINICAL DATA:  Shortness of Breath EXAM: CHEST - 2 VIEW COMPARISON:  Chest radiograph December 10, 2017 and chest CT February 14, 2018 FINDINGS: Port-A-Cath tip is in the superior vena cava near the cavoatrial junction. There is patchy infiltrate in the right base with small right pleural effusion. There is a small left pleural effusion with slight left base atelectasis. Heart size and pulmonary vascularity are within normal limits. Mild prominence in the right perihilar region is stable. Recent CT did show adenopathy in the right hilar region. No other adenopathy appreciable. No bone lesions. IMPRESSION: 1. Small pleural effusions bilaterally. Patchy consolidation posterior right base. Mild atelectasis left base. 2.  Stable cardiac silhouette. 3.  Mild right hilar adenopathy, better appreciable on recent CT. 4.  Port-A-Cath tip in superior vena cava.  No pneumothorax. Electronically Signed   By: Lowella Grip III M.D.   On: 04/04/2018 16:30   Ct Angio Chest Pe W And/or Wo Contrast  Result Date: 04/04/2018 CLINICAL DATA:  Shortness of breath and abdominal distention. History of rectal cancer. EXAM: CT ANGIOGRAPHY CHEST CT ABDOMEN AND PELVIS WITH CONTRAST TECHNIQUE: Multidetector CT imaging of the chest was performed using the standard protocol during bolus administration of intravenous contrast. Multiplanar CT image reconstructions and MIPs were obtained to evaluate the vascular anatomy. Multidetector CT imaging of the abdomen and pelvis was performed using the standard protocol during bolus administration of intravenous contrast. CONTRAST:  139mL ISOVUE-370 IOPAMIDOL (ISOVUE-370) INJECTION 76% COMPARISON:  CT 02/14/2018 FINDINGS: CTA CHEST FINDINGS Cardiovascular: The heart is normal in size. No pericardial effusion. The aorta is normal in caliber. No dissection. The pulmonary arterial tree is fairly well  opacified. No definite filling defects to suggest pulmonary embolism. Mediastinum/Nodes: Extensive and progressive mediastinal and hilar lymphadenopathy. Lungs/Pleura: New bilateral pleural effusions larger on the right and moderate on the left. There is also overlying atelectasis. Interstitial pulmonary edema is noted but no definite infiltrates or metastatic pulmonary nodules. Musculoskeletal: No chest wall mass. Suspect right-sided supraclavicular adenopathy. The Port-A-Cath is stable. Stable osseous metastatic disease. Review of the MIP images confirms the above findings. CT ABDOMEN and PELVIS FINDINGS Hepatobiliary: No obvious hepatic metastatic lesions. Moderate periportal edema and mild intrahepatic biliary dilatation. No common bile duct dilatation. Gallbladder is grossly normal. Stable perihepatic fluid. Pancreas: No mass, inflammation or ductal dilatation. Spleen: Normal size.  No focal lesions. Adrenals/Urinary Tract: Stable left adrenal gland lesion. The right adrenal gland is unremarkable. Both kidneys demonstrate poor perfusion renal cortical thinning. Left renal cyst is noted. No hydronephrosis. Uniform enhancing bladder wall thickening. Stomach/Bowel: The stomach, duodenum, small bowel and colon are grossly normal. Suspect gastric wall thickening. Transverse colostomy noted. Vascular/Lymphatic: Enlarged  celiac axis and hepatoduodenal ligament lymph nodes. Ill-defined retroperitoneal soft tissue density, likely tumor. No significant change. Reproductive: The prostate gland and seminal vesicles are unremarkable. Other: Diffuse body wall edema suggesting anasarca. Is also small volume abdominal/pelvic ascites, presacral edema and large bilateral hydroceles and marked scrotal swelling. Musculoskeletal: Stable scattered sclerotic bone lesions. Review of the MIP images confirms the above findings. IMPRESSION: 1. No CT findings for pulmonary embolism. 2. New sizable bilateral pleural effusions with  overlying atelectasis. 3. Progressive mediastinal and hilar lymphadenopathy. 4. Marked body wall edema/anasarca. Small volume abdominal/pelvic ascites. 5. Stable to slightly progressive abdominal lymphadenopathy. 6. Fairly marked periportal edema in the liver. 7. Stable osseous lesions. Electronically Signed   By: Marijo Sanes M.D.   On: 04/04/2018 19:35   Ct Abdomen Pelvis W Contrast  Result Date: 04/04/2018 CLINICAL DATA:  Shortness of breath and abdominal distention. History of rectal cancer. EXAM: CT ANGIOGRAPHY CHEST CT ABDOMEN AND PELVIS WITH CONTRAST TECHNIQUE: Multidetector CT imaging of the chest was performed using the standard protocol during bolus administration of intravenous contrast. Multiplanar CT image reconstructions and MIPs were obtained to evaluate the vascular anatomy. Multidetector CT imaging of the abdomen and pelvis was performed using the standard protocol during bolus administration of intravenous contrast. CONTRAST:  17mL ISOVUE-370 IOPAMIDOL (ISOVUE-370) INJECTION 76% COMPARISON:  CT 02/14/2018 FINDINGS: CTA CHEST FINDINGS Cardiovascular: The heart is normal in size. No pericardial effusion. The aorta is normal in caliber. No dissection. The pulmonary arterial tree is fairly well opacified. No definite filling defects to suggest pulmonary embolism. Mediastinum/Nodes: Extensive and progressive mediastinal and hilar lymphadenopathy. Lungs/Pleura: New bilateral pleural effusions larger on the right and moderate on the left. There is also overlying atelectasis. Interstitial pulmonary edema is noted but no definite infiltrates or metastatic pulmonary nodules. Musculoskeletal: No chest wall mass. Suspect right-sided supraclavicular adenopathy. The Port-A-Cath is stable. Stable osseous metastatic disease. Review of the MIP images confirms the above findings. CT ABDOMEN and PELVIS FINDINGS Hepatobiliary: No obvious hepatic metastatic lesions. Moderate periportal edema and mild  intrahepatic biliary dilatation. No common bile duct dilatation. Gallbladder is grossly normal. Stable perihepatic fluid. Pancreas: No mass, inflammation or ductal dilatation. Spleen: Normal size.  No focal lesions. Adrenals/Urinary Tract: Stable left adrenal gland lesion. The right adrenal gland is unremarkable. Both kidneys demonstrate poor perfusion renal cortical thinning. Left renal cyst is noted. No hydronephrosis. Uniform enhancing bladder wall thickening. Stomach/Bowel: The stomach, duodenum, small bowel and colon are grossly normal. Suspect gastric wall thickening. Transverse colostomy noted. Vascular/Lymphatic: Enlarged celiac axis and hepatoduodenal ligament lymph nodes. Ill-defined retroperitoneal soft tissue density, likely tumor. No significant change. Reproductive: The prostate gland and seminal vesicles are unremarkable. Other: Diffuse body wall edema suggesting anasarca. Is also small volume abdominal/pelvic ascites, presacral edema and large bilateral hydroceles and marked scrotal swelling. Musculoskeletal: Stable scattered sclerotic bone lesions. Review of the MIP images confirms the above findings. IMPRESSION: 1. No CT findings for pulmonary embolism. 2. New sizable bilateral pleural effusions with overlying atelectasis. 3. Progressive mediastinal and hilar lymphadenopathy. 4. Marked body wall edema/anasarca. Small volume abdominal/pelvic ascites. 5. Stable to slightly progressive abdominal lymphadenopathy. 6. Fairly marked periportal edema in the liver. 7. Stable osseous lesions. Electronically Signed   By: Marijo Sanes M.D.   On: 04/04/2018 19:35   US Liver Doppler  Result Date: 04/05/2018 CLINICAL DATA:  Elevated liver function tests. History of adenocarcinoma of the rectum. EXAM: DUPLEX ULTRASOUND OF LIVER TECHNIQUE: Color and duplex Doppler ultrasound was performed to evaluate the hepatic in-flow  and out-flow vessels. COMPARISON:  CT of the abdomen on 04/04/2016 FINDINGS: Portal Vein  Velocities Main:  79-94 cm/sec Right:  65 cm/sec Left:  62 cm/sec Hepatic Vein Velocities Right:  89 cm/sec Middle:  103 cm/sec Left:  79 cm/sec Hepatic Artery Velocity:  86 cm/sec Splenic Vein Velocity:  44 cm/sec Varices: None identified. Ascites: Small amount of scattered ascites is seen primarily around the liver. The portal vein is open and demonstrates normal hepatofugal flow. No evidence of portal vein thrombosis. There is evidence of at least mild biliary ductal dilatation with the upper proper hepatic bile duct measuring approximately 8 mm in diameter. No filling defects identified in the visualized biliary tree. Portal vein and hepatic venous waveforms appear unremarkable. The spleen is not enlarged. Bilateral pleural fluid visualized. IMPRESSION: 1. No evidence of portal vein thrombosis or significant reduction in portal vein velocities to suggest portal hypertension. 2. No evidence of hepatic veno-occlusive disease. 3. Evidence of some degree of biliary ductal dilatation. Small volume of ascites, predominantly around the liver. Electronically Signed   By: Aletta Edouard M.D.   On: 04/05/2018 17:19    Echo 04/05/2018: Normal ejection fraction, no wall motion normalities, no diastolic dysfunction   Subjective:   Discharge Exam: Vitals:   04/07/18 2131 04/08/18 0420  BP: 124/75 102/65  Pulse: 72 96  Resp:  16  Temp: 98 F (36.7 C) 98.2 F (36.8 C)  SpO2: 100% 100%   Vitals:   04/07/18 0700 04/07/18 1336 04/07/18 2131 04/08/18 0420  BP:  103/71 124/75 102/65  Pulse:  96 72 96  Resp:  15  16  Temp:  97.8 F (36.6 C) 98 F (36.7 C) 98.2 F (36.8 C)  TempSrc:  Oral Oral Oral  SpO2:  95% 100% 100%  Weight: 96 kg   97.2 kg  Height:        General exam: Appears calm and comfortable  Respiratory system: No increased work of breathing, diminished lung sounds at bases, no wheezes, crackles, rhonchi Cardiovascular system: Distant heart sounds, regular rate and rhythm, no  murmurs. Gastrointestinal system: Soft, abdominal wall edema noted, plus bowel sounds, no rebound or guarding, nontender, ostomy site is clean dry and intact with stool in bag Central nervous system: Alert and oriented. No focal neurological deficits. Extremities: 1+ lower extremity edema up to mid shin only Skin: Well-healed midline incision Psychiatry: Judgement and insight appear normal. Mood & affect appropriate.     The results of significant diagnostics from this hospitalization (including imaging, microbiology, ancillary and laboratory) are listed below for reference.     Microbiology: Recent Results (from the past 240 hour(s))  Culture, blood (Routine X 2) w Reflex to ID Panel     Status: None (Preliminary result)   Collection Time: 04/04/18 10:18 PM  Result Value Ref Range Status   Specimen Description   Final    BLOOD RIGHT ANTECUBITAL Performed at [facility] [address], [place], [place] [address]    Special Requests   Final    BOTTLES DRAWN AEROBIC AND ANAEROBIC Blood Culture adequate volume Performed at [facility] [address], [place], [place] [address]    Culture   Final    NO GROWTH 2 DAYS Performed at [facility] [address] [place], [place] [address]    Report Status PENDING  Incomplete  Culture, blood (Routine X 2) w Reflex to ID Panel     Status: None (Preliminary result)   Collection  Time: 04/04/18 10:18 PM  Result Value Ref Range Status   Specimen Description   Final    BLOOD LEFT HAND Performed at [facility], [place] [address], [place], [place] [address]    Special Requests   Final    BOTTLES DRAWN AEROBIC AND ANAEROBIC Blood Culture adequate volume Performed at [facility] [address] [place], [place] [address]    Culture   Final    NO GROWTH 2 DAYS Performed at [facility] [address] [place], [place] [address]    Report Status PENDING   Incomplete  MRSA PCR Screening     Status: None   Collection Time: 04/05/18  7:53 AM  Result Value Ref Range Status   MRSA by PCR NEGATIVE NEGATIVE Final    Comment:        The GeneXpert MRSA Assay (FDA approved for NASAL specimens only), is one component of a comprehensive MRSA colonization surveillance program. It is not intended to diagnose MRSA infection nor to guide or monitor treatment for MRSA infections. Performed at [facility], [place] [address] [place], [place] [address]      Labs: BNP (last 3 results) Recent Labs    04/04/18 2215  BNP 29.5   Basic Metabolic Panel: Recent Labs  Lab 04/04/18 1529 04/04/18 2215 04/04/18 2238 04/05/18 1115 04/06/18 0316 04/07/18 0541 04/08/18 0633  NA 135  --   --  134* 134* 133* 131*  K 4.3  --   --  4.1 4.0 3.6 3.2*  CL 103  --   --  102 100 98 96*  CO2 24  --   --  27 26 28 29   GLUCOSE 112*  --   --  111* 109* 103* 107*  BUN 18  --   --  17 18 20 18   CREATININE 1.05 1.10  --  1.23 1.08 1.11 1.17  CALCIUM 8.9  --   --  9.0 9.0 8.9 9.0  MG  --   --  2.2  --  2.0 1.9 1.9   Liver Function Tests: Recent Labs  Lab 04/04/18 1529 04/05/18 1115 04/06/18 0316 04/07/18 0541  AST 102* 79* 69* 66*  ALT 69* 57* 50* 45*  ALKPHOS 470* 390* 342* 318*  BILITOT 1.7* 1.6* 1.4* 1.2  PROT 6.2* 6.1* 5.9* 5.9*  ALBUMIN 2.4* 2.4* 2.2* 2.2*   No results for input(s): LIPASE, AMYLASE in the last 168 hours. No results for input(s): AMMONIA in the last 168 hours. CBC: Recent Labs  Lab 04/04/18 1526 04/05/18 1115 04/06/18 0316 04/07/18 0541  WBC 6.4 5.9 5.9 5.7  HGB 8.5* 8.4* 8.1* 8.0*  HCT 26.9* 27.1* 26.1* 25.5*  MCV 97.1 99.6 98.1 98.1  PLT 179 173 178 184   Cardiac Enzymes: No results for input(s): CKTOTAL, CKMB, CKMBINDEX, TROPONINI in the last 168 hours. BNP: Invalid input(s): POCBNP CBG: No results for input(s): GLUCAP in the last 168 hours. D-Dimer No results for input(s): DDIMER in the last 72  hours. Hgb A1c No results for input(s): HGBA1C in the last 72 hours. Lipid Profile No results for input(s): CHOL, HDL, LDLCALC, TRIG, CHOLHDL, LDLDIRECT in the last 72 hours. Thyroid function studies No results for input(s): TSH, T4TOTAL, T3FREE, THYROIDAB in the last 72 hours.  Invalid input(s): FREET3 Anemia work up No results for input(s): VITAMINB12, FOLATE, FERRITIN, TIBC, IRON, RETICCTPCT in the last 72 hours. Urinalysis    Component Value Date/Time   COLORURINE AMBER (A)  04/04/2018 1633   APPEARANCEUR HAZY (A) 04/04/2018 1633   LABSPEC 1.026 04/04/2018 1633   PHURINE 5.0 04/04/2018 1633   GLUCOSEU NEGATIVE 04/04/2018 1633   GLUCOSEU NEGATIVE 11/01/2016 0949   HGBUR NEGATIVE 04/04/2018 1633   [place] 04/04/2018 1633   KETONESUR NEGATIVE 04/04/2018 1633   PROTEINUR 30 (A) 04/04/2018 1633   UROBILINOGEN 0.2 11/01/2016 0949   NITRITE NEGATIVE 04/04/2018 1633   LEUKOCYTESUR NEGATIVE 04/04/2018 1633   Sepsis Labs Invalid input(s): PROCALCITONIN,  WBC,  LACTICIDVEN Microbiology Recent Results (from the past 240 hour(s))  Culture, blood (Routine X 2) w Reflex to ID Panel     Status: None (Preliminary result)   Collection Time: 04/04/18 10:18 PM  Result Value Ref Range Status   Specimen Description   Final    BLOOD RIGHT ANTECUBITAL Performed at [facility], [place] [address] [place], [place] [address]    Special Requests   Final    BOTTLES DRAWN AEROBIC AND ANAEROBIC Blood Culture adequate volume Performed at [facility] [address] [place], [place] [address]    Culture   Final    NO GROWTH 2 DAYS Performed at [facility] [address] [place], [place] [address]    Report Status PENDING  Incomplete  Culture, blood (Routine X 2) w Reflex to ID Panel     Status: None (Preliminary result)   Collection Time: 04/04/18 10:18 PM  Result Value Ref Range Status   Specimen Description   Final    BLOOD LEFT  HAND Performed at [facility], [place] [address], [place], [place] [address]    Special Requests   Final    BOTTLES DRAWN AEROBIC AND ANAEROBIC Blood Culture adequate volume Performed at [facility] [address] [place], [place] [address]    Culture   Final    NO GROWTH 2 DAYS Performed at [facility] [address], [place], [place] [address]    Report Status PENDING  Incomplete  MRSA PCR Screening     Status: None   Collection Time: 04/05/18  7:53 AM  Result Value Ref Range Status   MRSA by PCR NEGATIVE NEGATIVE Final    Comment:        The GeneXpert MRSA Assay (FDA approved for NASAL specimens only), is one component of a comprehensive MRSA colonization surveillance program. It is not intended to diagnose MRSA infection nor to guide or monitor treatment for MRSA infections. Performed at [facility], [place] [address] [place], [place] [address]      Time coordinating discharge: 20  SIGNED:   Cristy Folks, MD  [facility] 04/08/2018, 9:23 AM If 7PM-7AM, please contact night-coverage [email or web address] Password [email or web address]

## 2018-04-08 NOTE — Discharge Instructions (Signed)
Edema Edema is an abnormal buildup of fluids in your bodytissues. Edema is somewhatdependent on gravity to pull the fluid to the lowest place in your body. That makes the condition more common in the legs and thighs (lower extremities). Painless swelling of the feet and ankles is common and becomes more likely as you get older. It is also common in looser tissues, like around your eyes. When the affected area is squeezed, the fluid may move out of that spot and leave a dent for a few moments. This dent is called pitting. What are the causes? There are many possible causes of edema. Eating too much salt and being on your feet or sitting for a long time can cause edema in your legs and ankles. Hot weather may make edema worse. Common medical causes of edema include:  Heart failure.  Liver disease.  Kidney disease.  Weak blood vessels in your legs.  Cancer.  An injury.  Pregnancy.  Some medications.  Obesity.  What are the signs or symptoms? Edema is usually painless.Your skin may look swollen or shiny. How is this diagnosed? Your health care provider may be able to diagnose edema by asking about your medical history and doing a physical exam. You may need to have tests such as X-rays, an electrocardiogram, or blood tests to check for medical conditions that may cause edema. How is this treated? Edema treatment depends on the cause. If you have heart, liver, or kidney disease, you need the treatment appropriate for these conditions. General treatment may include:  Elevation of the affected body part above the level of your heart.  Compression of the affected body part. Pressure from elastic bandages or support stockings squeezes the tissues and forces fluid back into the blood vessels. This keeps fluid from entering the tissues.  Restriction of fluid and salt intake.  Use of a water pill (diuretic). These medications are appropriate only for some types of edema. They pull fluid  out of your body and make you urinate more often. This gets rid of fluid and reduces swelling, but diuretics can have side effects. Only use diuretics as directed by your health care provider.  Follow these instructions at home:  Keep the affected body part above the level of your heart when you are lying down.  Do not sit still or stand for prolonged periods.  Do not put anything directly under your knees when lying down.  Do not wear constricting clothing or garters on your upper legs.  Exercise your legs to work the fluid back into your blood vessels. This may help the swelling go down.  Wear elastic bandages or support stockings to reduce ankle swelling as directed by your health care provider.  Eat a low-salt diet to reduce fluid if your health care provider recommends it.  Only take medicines as directed by your health care provider. Contact a health care provider if:  Your edema is not responding to treatment.  You have heart, liver, or kidney disease and notice symptoms of edema.  You have edema in your legs that does not improve after elevating them.  You have sudden and unexplained weight gain. Get help right away if:  You develop shortness of breath or chest pain.  You cannot breathe when you lie down.  You develop pain, redness, or warmth in the swollen areas.  You have heart, liver, or kidney disease and suddenly get edema.  You have a fever and your symptoms suddenly get worse. This information is   not intended to replace advice given to you by your health care provider. Make sure you discuss any questions you have with your health care provider. Document Released: 05/29/2005 Document Revised: 11/04/2015 Document Reviewed: 03/21/2013 Elsevier Interactive Patient Education  2017 [employer]  

## 2018-04-08 NOTE — Progress Notes (Signed)
[facility] OFFICE PROGRESS NOTE   Diagnosis: Rectal cancer  INTERVAL HISTORY:   Nicholas Suarez returns for a scheduled visit.  He was seen at [facility] to can consider enrollment on a clinical trial with a FGFR inhibitor.  He developed obstructive symptoms and was taken to a diverting transverse colostomy and omentectomy on 03/25/2018.  He was noted to have ascites, omental caking, peritoneal nodularity. 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  Extensive lymphovascular invasion is noted in 4 lymph node contained metastatic adenocarcinoma.  Line he was discharged on 04/01/2018. He was admitted 04/04/2018 with dyspnea and anasarca.  He was treated with IV furosemide and metolazone.  There was improvement in lower extremity edema and dyspnea.  He was discharged earlier today.  She also reports persistent edema.  He has developed lower back pain in addition to pain from surgery.  Tramadol does not help the pain.  He obtained relief of pain with oxycodone last night.  The colostomy is functioning.  Objective:  Vital signs in last 24 hours:  Blood pressure 121/66, pulse 93, temperature 97.6 F (36.4 C), temperature source Oral, resp. rate 18, height '6\' 3"'  (1.905 m), weight 209 lb 14.4 oz (95.2 kg), SpO2 96 %.    HEENT: No thrush or ulcers Resp: Lungs clear with decreased breath sounds in the right lower chest, no respiratory distress Cardio: Regular rate and rhythm GI: Healed midline incision, left lower quadrant colostomy with brown stool, nontender Vascular: 2+ edema throughout the right greater than left leg.  2+ edema at the suprapubic area and scrotum.  Presacral edema. Skill skeletal: The area of pain is at the bilateral flank region  Portacath/PICC-without erythema  Lab Results:  Lab Results  Component Value Date   WBC 5.7 04/07/2018   HGB 8.0 (L) 04/07/2018   HCT 25.5 (L) 04/07/2018   MCV 98.1 04/07/2018   PLT 184 04/07/2018   NEUTROABS 2.5 02/18/2018    CMP  Lab Results  Component Value Date   NA 131 (L) 04/08/2018   K 3.2 (L) 04/08/2018   CL 96 (L) 04/08/2018   CO2 29 04/08/2018   GLUCOSE 107 (H) 04/08/2018   BUN 18 04/08/2018   CREATININE 1.17 04/08/2018   CALCIUM 9.0 04/08/2018   PROT 5.9 (L) 04/07/2018   ALBUMIN 2.2 (L) 04/07/2018   AST 66 (H) 04/07/2018   ALT 45 (H) 04/07/2018   ALKPHOS 318 (H) 04/07/2018   BILITOT 1.2 04/07/2018   GFRNONAA >60 04/08/2018   GFRAA >60 04/08/2018    Lab Results  Component Value Date   CEA1 876.00 (H) 02/18/2018     Imaging:  Dg Chest 2 View  Result Date: 04/04/2018 CLINICAL DATA:  Shortness of Breath EXAM: CHEST - 2 VIEW COMPARISON:  Chest radiograph December 10, 2017 and chest CT February 14, 2018 FINDINGS: Port-A-Cath tip is in the superior vena cava near the cavoatrial junction. There is patchy infiltrate in the right base with small right pleural effusion. There is a small left pleural effusion with slight left base atelectasis. Heart size and pulmonary vascularity are within normal limits. Mild prominence in the right perihilar region is stable. Recent CT did show adenopathy in the right hilar region. No other adenopathy appreciable. No bone lesions. IMPRESSION: 1. Small pleural effusions bilaterally. Patchy consolidation posterior right base. Mild atelectasis left base. 2.  Stable cardiac silhouette. 3.  Mild right hilar adenopathy, better appreciable on recent CT. 4.  Port-A-Cath tip in superior vena cava.  No pneumothorax. Electronically Signed   By: Lowella Grip III M.D.   On: 04/04/2018 16:30   Ct Angio Chest Pe W And/or Wo Contrast  Result Date: 04/04/2018 CLINICAL DATA:  Shortness of breath and abdominal distention. History of rectal cancer. EXAM: CT ANGIOGRAPHY CHEST CT ABDOMEN AND PELVIS WITH CONTRAST TECHNIQUE: Multidetector CT imaging of the chest was performed using the standard protocol during bolus administration of intravenous contrast.  Multiplanar CT image reconstructions and MIPs were obtained to evaluate the vascular anatomy. Multidetector CT imaging of the abdomen and pelvis was performed using the standard protocol during bolus administration of intravenous contrast. CONTRAST:  121m ISOVUE-370 IOPAMIDOL (ISOVUE-370) INJECTION 76% COMPARISON:  CT 02/14/2018 FINDINGS: CTA CHEST FINDINGS Cardiovascular: The heart is normal in size. No pericardial effusion. The aorta is normal in caliber. No dissection. The pulmonary arterial tree is fairly well opacified. No definite filling defects to suggest pulmonary embolism. Mediastinum/Nodes: Extensive and progressive mediastinal and hilar lymphadenopathy. Lungs/Pleura: New bilateral pleural effusions larger on the right and moderate on the left. There is also overlying atelectasis. Interstitial pulmonary edema is noted but no definite infiltrates or metastatic pulmonary nodules. Musculoskeletal: No chest wall mass. Suspect right-sided supraclavicular adenopathy. The Port-A-Cath is stable. Stable osseous metastatic disease. Review of the MIP images confirms the above findings. CT ABDOMEN and PELVIS FINDINGS Hepatobiliary: No obvious hepatic metastatic lesions. Moderate periportal edema and mild intrahepatic biliary dilatation. No common bile duct dilatation. Gallbladder is grossly normal. Stable perihepatic fluid. Pancreas: No mass, inflammation or ductal dilatation. Spleen: Normal size.  No focal lesions. Adrenals/Urinary Tract: Stable left adrenal gland lesion. The right adrenal gland is unremarkable. Both kidneys demonstrate poor perfusion renal cortical thinning. Left renal cyst is noted. No hydronephrosis. Uniform enhancing bladder wall thickening. Stomach/Bowel: The stomach, duodenum, small bowel and colon are grossly normal. Suspect gastric wall thickening. Transverse colostomy noted. Vascular/Lymphatic: Enlarged celiac axis and hepatoduodenal ligament lymph nodes. Ill-defined retroperitoneal soft  tissue density, likely tumor. No significant change. Reproductive: The prostate gland and seminal vesicles are unremarkable. Other: Diffuse body wall edema suggesting anasarca. Is also small volume abdominal/pelvic ascites, presacral edema and large bilateral hydroceles and marked scrotal swelling. Musculoskeletal: Stable scattered sclerotic bone lesions. Review of the MIP images confirms the above findings. IMPRESSION: 1. No CT findings for pulmonary embolism. 2. New sizable bilateral pleural effusions with overlying atelectasis. 3. Progressive mediastinal and hilar lymphadenopathy. 4. Marked body wall edema/anasarca. Small volume abdominal/pelvic ascites. 5. Stable to slightly progressive abdominal lymphadenopathy. 6. Fairly marked periportal edema in the liver. 7. Stable osseous lesions. Electronically Signed   By: PMarijo SanesM.D.   On: 04/04/2018 19:35   Ct Abdomen Pelvis W Contrast  Result Date: 04/04/2018 CLINICAL DATA:  Shortness of breath and abdominal distention. History of rectal cancer. EXAM: CT ANGIOGRAPHY CHEST CT ABDOMEN AND PELVIS WITH CONTRAST TECHNIQUE: Multidetector CT imaging of the chest was performed using the standard protocol during bolus administration of intravenous contrast. Multiplanar CT image reconstructions and MIPs were obtained to evaluate the vascular anatomy. Multidetector CT imaging of the abdomen and pelvis was performed using the standard protocol during bolus administration of intravenous contrast. CONTRAST:  1081mISOVUE-370 IOPAMIDOL (ISOVUE-370) INJECTION 76% COMPARISON:  CT 02/14/2018 FINDINGS: CTA CHEST FINDINGS Cardiovascular: The heart is normal in size. No pericardial effusion. The aorta is normal in caliber. No dissection. The pulmonary arterial tree is fairly well opacified. No definite filling defects to suggest pulmonary embolism. Mediastinum/Nodes: Extensive and progressive mediastinal and hilar lymphadenopathy. Lungs/Pleura: New bilateral pleural effusions  larger on the right and moderate on the left. There is also overlying atelectasis. Interstitial pulmonary edema is noted but no definite infiltrates or metastatic pulmonary nodules. Musculoskeletal: No chest wall mass. Suspect right-sided supraclavicular adenopathy. The Port-A-Cath is stable. Stable osseous metastatic disease. Review of the MIP images confirms the above findings. CT ABDOMEN and PELVIS FINDINGS Hepatobiliary: No obvious hepatic metastatic lesions. Moderate periportal edema and mild intrahepatic biliary dilatation. No common bile duct dilatation. Gallbladder is grossly normal. Stable perihepatic fluid. Pancreas: No mass, inflammation or ductal dilatation. Spleen: Normal size.  No focal lesions. Adrenals/Urinary Tract: Stable left adrenal gland lesion. The right adrenal gland is unremarkable. Both kidneys demonstrate poor perfusion renal cortical thinning. Left renal cyst is noted. No hydronephrosis. Uniform enhancing bladder wall thickening. Stomach/Bowel: The stomach, duodenum, small bowel and colon are grossly normal. Suspect gastric wall thickening. Transverse colostomy noted. Vascular/Lymphatic: Enlarged celiac axis and hepatoduodenal ligament lymph nodes. Ill-defined retroperitoneal soft tissue density, likely tumor. No significant change. Reproductive: The prostate gland and seminal vesicles are unremarkable. Other: Diffuse body wall edema suggesting anasarca. Is also small volume abdominal/pelvic ascites, presacral edema and large bilateral hydroceles and marked scrotal swelling. Musculoskeletal: Stable scattered sclerotic bone lesions. Review of the MIP images confirms the above findings. IMPRESSION: 1. No CT findings for pulmonary embolism. 2. New sizable bilateral pleural effusions with overlying atelectasis. 3. Progressive mediastinal and hilar lymphadenopathy. 4. Marked body wall edema/anasarca. Small volume abdominal/pelvic ascites. 5. Stable to slightly progressive abdominal  lymphadenopathy. 6. Fairly marked periportal edema in the liver. 7. Stable osseous lesions. Electronically Signed   By: Marijo Sanes M.D.   On: 04/04/2018 19:35   US Liver Doppler  Result Date: 04/05/2018 CLINICAL DATA:  Elevated liver function tests. History of adenocarcinoma of the rectum. EXAM: DUPLEX ULTRASOUND OF LIVER TECHNIQUE: Color and duplex Doppler ultrasound was performed to evaluate the hepatic in-flow and out-flow vessels. COMPARISON:  CT of the abdomen on 04/04/2016 FINDINGS: Portal Vein Velocities Main:  79-94 cm/sec Right:  65 cm/sec Left:  62 cm/sec Hepatic Vein Velocities Right:  89 cm/sec Middle:  103 cm/sec Left:  79 cm/sec Hepatic Artery Velocity:  86 cm/sec Splenic Vein Velocity:  44 cm/sec Varices: None identified. Ascites: Small amount of scattered ascites is seen primarily around the liver. The portal vein is open and demonstrates normal hepatofugal flow. No evidence of portal vein thrombosis. There is evidence of at least mild biliary ductal dilatation with the upper proper hepatic bile duct measuring approximately 8 mm in diameter. No filling defects identified in the visualized biliary tree. Portal vein and hepatic venous waveforms appear unremarkable. The spleen is not enlarged. Bilateral pleural fluid visualized. IMPRESSION: 1. No evidence of portal vein thrombosis or significant reduction in portal vein velocities to suggest portal hypertension. 2. No evidence of hepatic veno-occlusive disease. 3. Evidence of some degree of biliary ductal dilatation. Small volume of ascites, predominantly around the liver. Electronically Signed   By: Aletta Edouard M.D.   On: 04/05/2018 17:19    Medications: I have reviewed the patient's current medications.   Assessment/Plan: 1. Rectal cancer  MRI lumbar spine 10/20/2016 with bilateral hydronephrosis and retroperitoneal adenopathy  CT abdomen/pelvis 10/20/2016 with extensive retroperitoneal process with marked interstitial  thickening and lymphadenopathy; bilateral hydronephrosis; similar ill-defined soft tissue density and skin thickening around the umbilicus; diffuse bladder wall thickening slightly asymmetric at the bladder dome but no obvious bladder mass  Biopsy para-aortic lymph node 11/10/2016-metastatic adenocarcinoma consistent with GI primary  11/23/2016 CEA elevated, CA-19-9 normal  Colonoscopy 11/27/2016-fungating infiltrative nonobstructing mass in the distal rectum involving the anal verge. The rectum was fixed and frozen in the pelvis precluding passage of scope beyond the distal sigmoid colon. Biopsy showed adenocarcinoma with signet Suarez cells.  Foundation 1-microsatellite stable; tumor mutational burden 5; NOTCH3amplification; NOTCH3rearrangement exon 14; [id number] FGFR2 rearrangement  Upper endoscopy 11/27/2016-normal.  PET scan 06/22/2018showed minimal activity in the primary rectal carcinoma, diffuse wall thickening; mild activity associate with retroperitoneal lymph nodes; persistent haziness within the retroperitoneum. No evidence of liver metastasis or distant metastasis.  Cycle 1 FOLFOX 12/06/2016  Posterior bladder wall biopsy 12/11/2016-metastatic carcinoma, consistent with metastatic signet Suarez cell carcinoma  Biopsy left supraclavicular adenopathy 12/14/2016-metastatic signet Suarez cell adenocarcinoma  Cycle 2 FOLFOX 12/20/2016  Cycle 3 FOLFOX 01/03/2017 (oxaliplatin dose reduced secondary to thrombocytopenia)   Cycle 4 FOLFOX 01/17/2017 (oxaliplatin held due to thrombocytopenia)  Cycle 5 FOLFOX 02/15/2017  CTs 02/26/2017-new sclerotic lesions in the spine and right iliac, no residual adenopathy, persistent rectal wall thickening  Cycle 6 FOLFOX 03/01/2017 (Neulasta added, oxaliplatin further dose reduced)   Cycle 7 FOLFOX 03/15/2017 (oxaliplatin held secondary to thrombocytopenia)  Cycle 8 FOLFOX 03/29/2017  Cycle 9 FOLFOX 04/12/2017 (oxaliplatin held secondary to  thrombocytopenia and neuropathy)  Cycle 10 FOLFOX 04/25/2017 (oxaliplatin held secondary to neuropathy)  CTs 05/07/2017-stable rectal thickening, stable periaortic soft tissue, stable sclerotic lesions at the lumbar spine and pelvis  Treatment changed to maintenance capecitabine beginning 05/14/2017  CTs 08/03/2017-irregular annular wall thickening mid to lower rectum appears mildly increased; newly enlarged infiltrative retroperitoneal nodal metastasis; mild left supraclavicular adenopathy appears minimally increased; ill-defined umbilical soft tissue thickening stable; small scattered sclerotic bone lesions stable.  Cycle 1 FOLFIRI/Panitumumab 08/20/2017  Cycle 2 FOLFIRI/Panitumumab 09/10/2017 (irinotecan and 5-FU dose reduced due to mucositis)  Cycle 3 FOLFIRI/panitumumab 09/24/2017  Cycle 4 FOLFIRI/panitumumab 10/08/2017 (5-fluorouracil further dose reduced secondary to mucositis)  Cycle 5 FOLFIRI/Panitumumab 10/29/2017  CTs 11/09/2017-increase in mediastinal, retroperitoneal, and mesenteric adenopathy, increased wall thickening of the rectum, increased sclerotic lesions at the thoracic spine  Cycle 1 FOLFOX 12/06/2017  Cycle 2 FOLFOX 12/24/2017  Cycle 3 FOLFOX 01/08/2018  Cycle 4 FOLFOX 01/21/2018  Cycle 5 FOLFOX 02/04/2018  CTs 02/14/2018- improved tumor in the retroperitoneum and mesentery, mixed response in chest lymph nodes, increase conspicuity of sclerotic bone lesions, left-sided bladder wall enhancement and thickening, wall thickening at the lower rectum and anus  Cycle 6 FOLFOX 02/18/2018  Diverting transverse colostomy/omentectomy at [facility] 03/25/2018, pathology revealed signet Suarez adenocarcinoma 2. Retroperitoneal lymphadenopathy secondary to #1 3. Bilateral hydronephrosis status post evaluation by urology 4. Asymmetry of the bladder dome on CT 10/20/2016 status post cystoscopy with biopsy planned 12/11/2016 (Dr. Liliane Channel of the posterior bladder wall confirmed metastatic  carcinoma consistent with metastatic signet Suarez cell carcinoma 5. Change in bowel habits, secondary to #1-improved. 6. Right leg edema, question due to lymphatic obstruction;venous Doppler negative for DVT 11/23/2016. 7. Port-A-Cath placement 12/05/2016 8. Thrombocytopenia secondary to chemotherapy; oxaliplatin dose reduced with cycle 3 FOLFOX. Progressive thrombocytopenia 01/17/2017; oxaliplatin held 01/17/2017. 9. Right lower extremity cellulitis 01/27/2017-blood culture positive for methicillin sensitive staph aureus; Port-A-Cath removed. PICC line placed. TEE 01/30/2017 without evidence of vegetation. 14 days of Ancef recommended after Port-A-Cath removal with end date 02/14/2017. 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 Mucositis following cycle 1 FOLFIRI/Panitumumab-cycle 2 delayed andirinotecan/5-FU dose reduced.Improved 12. Chills and generalized aches following cycles 2,3, and 4FOLFOX 13. Averting transverse colostomy and omentectomy 03/25/2018 14. Admission with dyspnea and anasarca 04/04/2018   Disposition: Mr. Behan has metastatic rectal cancer.  There has been recent clinical and CT evidence of disease progression.  The CEA is higher. He was referred to [facility] to consider enrollment on a clinical trial.  He developed obstructive symptoms and required a palliative transverse colostomy 03/25/2018.  He is recovering from surgery. Anasarca is secondary to hypoalbuminemia and lymphatic/vascular obstruction from tumor in the abdomen and retroperitoneum.  He will continue once daily furosemide.  Mr. Rahmani has a borderline performance status to receive systemic therapy.  He is scheduled to see Dr. Cloyd Stagers at [facility] on 04/12/2018.  He will return for an office visit here on 04/15/2018.  He would like to enroll in the clinical trial if his performance status improves over the next week.  Standard treatment options include Lonsurf, regorafenib, and supportive care.  25 minutes were spent with the  patient today.  The majority of the time was used for counseling and coordination of care.  Betsy Coder, MD  04/08/2018  12:29 PM

## 2018-04-09 ENCOUNTER — Telehealth: Payer: Self-pay | Admitting: Oncology

## 2018-04-09 ENCOUNTER — Telehealth: Payer: Self-pay | Admitting: *Deleted

## 2018-04-09 MED ORDER — POTASSIUM CHLORIDE CRYS ER 20 MEQ PO TBCR: 20.0000 meq | EXTENDED_RELEASE_TABLET | Freq: Every day | ORAL | 1 refills | Status: DC

## 2018-04-09 NOTE — Telephone Encounter (Signed)
Scheduled appt per 10/29 sch message - pt is aware of appt date and time   

## 2018-04-09 NOTE — Telephone Encounter (Signed)
Called pt to make hosp follow-up there was no answer LMOM RTC. Sent CRM to [facility] for FYI.Marland KitchenJohny Chess

## 2018-04-09 NOTE — Telephone Encounter (Signed)
Wife called to follow up on script for K+ that was supposed to be sent to [facility] on [address]. Also asking what his weight was at visit yesterday. Called back and left VM that script for Ktab 20 meq daily was sent to pharmacy and his weight was 209 lb yesterday. Call back for any other needs.

## 2018-04-10 LAB — CULTURE, BLOOD (ROUTINE X 2)
CULTURE: NO GROWTH
Culture: NO GROWTH
SPECIAL REQUESTS: ADEQUATE
Special Requests: ADEQUATE

## 2018-04-10 NOTE — Telephone Encounter (Signed)
Called pt to verify appt that was made for 04/17/18 for hosp f/u. Pt states he called back yesterday afternoon and someone made appt for him. Inform had some additional questions concerning discharge. Completed TCM below.Nicholas Suarez  Transition Care Management Follow-up Telephone Call   Date discharged? 04/08/18   How have you been since you were released from the hospital? Pt states he seem to be doing ok   Do you understand why you were in the hospital? YES   Do you understand the discharge instructions? YES   Where were you discharged to? Home   Items Reviewed:  Medications reviewed: YES  Allergies reviewed: YES  Dietary changes reviewed: YES, heart healthy  Referrals reviewed: No referral needed   Functional Questionnaire:   Activities of Daily Living (ADLs):   He states he are independent in the following: ambulation, bathing and hygiene, feeding, continence, grooming, toileting and dressing States he doesn't require assistance    Any transportation issues/concerns?: NO   Any patient concerns? NO   Confirmed importance and date/time of follow-up visits scheduled YES, appt 04/17/18  Provider Appointment booked with Dr. Ronnald Ramp  Confirmed with patient if condition begins to worsen call PCP or go to the ER.  Patient was given the office number and encouraged to call back with question or concerns.  : YES

## 2018-04-11 ENCOUNTER — Ambulatory Visit: Payer: Medicare Other | Admitting: Internal Medicine

## 2018-04-15 ENCOUNTER — Ambulatory Visit: Payer: Medicare Other | Admitting: Oncology

## 2018-04-15 ENCOUNTER — Other Ambulatory Visit: Payer: Medicare Other

## 2018-04-16 ENCOUNTER — Encounter: Payer: Self-pay | Admitting: Oncology

## 2018-04-16 ENCOUNTER — Telehealth: Payer: Self-pay | Admitting: *Deleted

## 2018-04-16 NOTE — Telephone Encounter (Signed)
Wife called to report visit with Dr. Aleatha Borer at [facility] shows nothing left to do for him. He is in a lot of pain and thinks it would be very difficult to get him in tomorrow. Wishes to speak with Dr. Benay Spice about Hospice as soon as possible.  Dr. Benay Spice agrees to call her and also speak with patient tonight. She requests he call around 6 pm for his sister to be there as well.  Call # [phone]

## 2018-04-16 NOTE — Progress Notes (Signed)
I discussed [name]'s case with his sister and wife by telephone at approximately 6:20 PM on 04/16/2018.  He saw Dr. Aleatha Suarez on 04/15/2018.  I discussed the case with Dr. Minerva Suarez.  She indicated his status is declining and he is not a candidate for a clinical trial or further systemic therapy.  She recommends hospice care.  [name] was sleeping when I spoke to his family.  They indicate he is status is declining.  He has pain and increased swelling.  They agreed to a [facility] hospice referral.  The wife and sister are in agreement with a no CODE BLUE status, but would like the hospice team to discuss this with [name].  The plan is to keep him at home for now.  We will contact the [facility] hospice program and recommend home hospice care beginning on 04/17/2018.  He will begin Protonix for indigestion.  Oxycodone has not relieved his pain.  We will ask the hospice team to prescribe Roxanol.  I stressed to his family that I am available to help with his care as needed.

## 2018-04-17 ENCOUNTER — Inpatient Hospital Stay: Payer: Medicare Other | Admitting: Internal Medicine

## 2018-04-17 ENCOUNTER — Inpatient Hospital Stay: Payer: Federal, State, Local not specified - PPO

## 2018-04-17 ENCOUNTER — Inpatient Hospital Stay: Payer: Federal, State, Local not specified - PPO | Admitting: Oncology

## 2018-04-19 ENCOUNTER — Telehealth: Payer: Self-pay | Admitting: *Deleted

## 2018-04-19 MED ORDER — PANTOPRAZOLE SODIUM 40 MG PO TBEC: 40.0000 mg | DELAYED_RELEASE_TABLET | Freq: Two times a day (BID) | ORAL | Status: DC

## 2018-04-19 MED ORDER — SUCRALFATE 1 G PO TABS: 1.0000 g | ORAL_TABLET | Freq: Two times a day (BID) | ORAL | 1 refills | Status: AC

## 2018-04-19 MED ORDER — ACETAMINOPHEN 325 MG PO TABS: 650.0000 mg | ORAL_TABLET | Freq: Four times a day (QID) | ORAL | Status: AC | PRN

## 2018-04-19 MED ORDER — OMEPRAZOLE 20 MG PO CPDR: 20.0000 mg | DELAYED_RELEASE_CAPSULE | Freq: Every day | ORAL | 11 refills | Status: DC

## 2018-04-19 NOTE — Telephone Encounter (Signed)
Wife had reported that patient was already on Protonix bid (not on his med list). Benjimen really wants to continue the Carafate taking it twice daily helps him. OK per Dr. Benay Spice. Wife notified via VM

## 2018-04-19 NOTE — Addendum Note (Signed)
Addended by: Tania Ade on: 04/19/2018 04:40 PM   Modules accepted: Orders

## 2018-04-19 NOTE — Telephone Encounter (Signed)
Wife called for MD to call in carafate tablet to dissolve instead of the slurry he has been using due to cost. Takes it prn.  Informed wife per MD, the carafate must be used ac and hs routinely to be effective. He suggests Prilosec OTC 20 mg daily-try this instead. Wife agrees to try this. Hospice RN, Rickey Barbara called to request verbal DNR order from Dr. Benay Spice and permission to call in Tylenol 352 mg tabs for prn use. Informed her that MD approves both.

## 2018-04-22 ENCOUNTER — Other Ambulatory Visit: Payer: Self-pay | Admitting: *Deleted

## 2018-04-22 ENCOUNTER — Telehealth: Payer: Self-pay | Admitting: *Deleted

## 2018-04-22 MED ORDER — OXYCODONE-ACETAMINOPHEN 10-325 MG PO TABS: 1.0000 | ORAL_TABLET | ORAL | 0 refills | Status: AC | PRN

## 2018-04-22 NOTE — Telephone Encounter (Signed)
Wife notified of new orders.

## 2018-04-22 NOTE — Telephone Encounter (Signed)
Renee w/Hospice called to inquire if Lasix and K+ can be discontinued. He is looking dehydrated. Also, he is needing oxyir 10 mg every 4 hours ATC and also taking Tylenol. Will MD order oxycodone 10 mg with acetaminophen to decrease the number of pills he needs to take. Per MD: D/C Lasix and K+ and will order oxycodone-acetaminophen 10-325 every 4 hours prn pain. Sent to [facility] on [address] as requested.

## 2018-04-25 MED ORDER — OCTREOTIDE ACETATE 30 MG IM KIT
PACK | INTRAMUSCULAR | Status: AC
  Filled 2018-04-25: qty 1

## 2018-05-15 ENCOUNTER — Other Ambulatory Visit: Payer: Self-pay | Admitting: *Deleted

## 2018-05-15 MED ORDER — PANTOPRAZOLE SODIUM 40 MG PO TBEC: 40.0000 mg | DELAYED_RELEASE_TABLET | Freq: Two times a day (BID) | ORAL | 1 refills | Status: AC

## 2018-05-15 NOTE — Telephone Encounter (Signed)
Needs refill on protonix. Only allowed 2 week supply at a time with new contract.

## 2018-05-17 ENCOUNTER — Telehealth: Payer: Self-pay | Admitting: *Deleted

## 2018-05-17 NOTE — Telephone Encounter (Signed)
Received fax from [facility] that his pantoprazole 40 mg requires prior authorization. Spoke with Hospice RN and confirmed that the med is covered under his Hospice benefit. Pharmacy needs to call [facility] [phone] to resolve.  Called pharmacy and left this information on their voice mail.

## 2018-05-23 ENCOUNTER — Encounter: Payer: Self-pay | Admitting: Oncology

## 2018-06-12 DEATH — deceased
